# Patient Record
Sex: Female | Born: 1935
Health system: Southern US, Community
[De-identification: ages and names within clinical notes are randomized; demographics above are authoritative.]

## PROBLEM LIST (undated history)

## (undated) DIAGNOSIS — F329 Major depressive disorder, single episode, unspecified: Secondary | ICD-10-CM

## (undated) DIAGNOSIS — R1314 Dysphagia, pharyngoesophageal phase: Secondary | ICD-10-CM

## (undated) DIAGNOSIS — L509 Urticaria, unspecified: Secondary | ICD-10-CM

## (undated) DIAGNOSIS — M25552 Pain in left hip: Secondary | ICD-10-CM

## (undated) DIAGNOSIS — T783XXA Angioneurotic edema, initial encounter: Secondary | ICD-10-CM

## (undated) DIAGNOSIS — M5430 Sciatica, unspecified side: Secondary | ICD-10-CM

## (undated) DIAGNOSIS — F32A Depression, unspecified: Secondary | ICD-10-CM

## (undated) DIAGNOSIS — M7989 Other specified soft tissue disorders: Secondary | ICD-10-CM

## (undated) DIAGNOSIS — IMO0001 Reserved for inherently not codable concepts without codable children: Secondary | ICD-10-CM

## (undated) HISTORY — DX: Reserved for inherently not codable concepts without codable children: IMO0001

## (undated) HISTORY — DX: Urticaria, unspecified: L50.9

## (undated) HISTORY — DX: Pain in left hip: M25.552

## (undated) HISTORY — DX: Sciatica, unspecified side: M54.30

## (undated) HISTORY — DX: Dysphagia, pharyngoesophageal phase: R13.14

## (undated) HISTORY — PX: ABDOMINAL HYSTERECTOMY: SHX81

## (undated) HISTORY — DX: Angioneurotic edema, initial encounter: T78.3XXA

## (undated) HISTORY — DX: Depression, unspecified: F32.A

## (undated) HISTORY — DX: Other specified soft tissue disorders: M79.89

## (undated) HISTORY — DX: Major depressive disorder, single episode, unspecified: F32.9

---

## 1997-05-04 ENCOUNTER — Encounter (INDEPENDENT_AMBULATORY_CARE_PROVIDER_SITE_OTHER): Payer: Self-pay | Admitting: *Deleted

## 1997-05-04 LAB — CONVERTED CEMR LAB

## 1998-08-26 ENCOUNTER — Emergency Department (HOSPITAL_COMMUNITY): Admission: EM | Admit: 1998-08-26 | Discharge: 1998-08-26 | Payer: Self-pay | Admitting: Emergency Medicine

## 2001-02-18 ENCOUNTER — Encounter: Admission: RE | Admit: 2001-02-18 | Discharge: 2001-02-18 | Payer: Self-pay | Admitting: Family Medicine

## 2001-02-21 ENCOUNTER — Encounter: Admission: RE | Admit: 2001-02-21 | Discharge: 2001-02-21 | Payer: Self-pay | Admitting: Family Medicine

## 2002-02-28 ENCOUNTER — Encounter: Admission: RE | Admit: 2002-02-28 | Discharge: 2002-02-28 | Payer: Self-pay | Admitting: Family Medicine

## 2002-04-11 ENCOUNTER — Observation Stay (HOSPITAL_COMMUNITY): Admission: RE | Admit: 2002-04-11 | Discharge: 2002-04-12 | Payer: Self-pay | Admitting: *Deleted

## 2002-04-11 ENCOUNTER — Encounter (INDEPENDENT_AMBULATORY_CARE_PROVIDER_SITE_OTHER): Payer: Self-pay

## 2002-06-13 ENCOUNTER — Ambulatory Visit (HOSPITAL_COMMUNITY): Admission: RE | Admit: 2002-06-13 | Discharge: 2002-06-13 | Payer: Self-pay | Admitting: Ophthalmology

## 2002-07-07 ENCOUNTER — Ambulatory Visit (HOSPITAL_COMMUNITY): Admission: RE | Admit: 2002-07-07 | Discharge: 2002-07-07 | Payer: Self-pay | Admitting: Ophthalmology

## 2002-09-26 ENCOUNTER — Encounter: Admission: RE | Admit: 2002-09-26 | Discharge: 2002-09-26 | Payer: Self-pay | Admitting: Family Medicine

## 2002-11-01 ENCOUNTER — Encounter: Admission: RE | Admit: 2002-11-01 | Discharge: 2002-11-01 | Payer: Self-pay | Admitting: Family Medicine

## 2002-11-08 ENCOUNTER — Encounter: Payer: Self-pay | Admitting: Sports Medicine

## 2002-11-08 ENCOUNTER — Encounter: Admission: RE | Admit: 2002-11-08 | Discharge: 2002-11-08 | Payer: Self-pay | Admitting: Sports Medicine

## 2002-11-29 ENCOUNTER — Encounter: Admission: RE | Admit: 2002-11-29 | Discharge: 2002-11-29 | Payer: Self-pay | Admitting: Family Medicine

## 2003-02-20 ENCOUNTER — Encounter: Admission: RE | Admit: 2003-02-20 | Discharge: 2003-02-20 | Payer: Self-pay | Admitting: Family Medicine

## 2003-06-18 ENCOUNTER — Encounter: Admission: RE | Admit: 2003-06-18 | Discharge: 2003-06-18 | Payer: Self-pay | Admitting: Allergy and Immunology

## 2003-07-19 ENCOUNTER — Encounter: Admission: RE | Admit: 2003-07-19 | Discharge: 2003-07-19 | Payer: Self-pay | Admitting: Sports Medicine

## 2003-08-27 ENCOUNTER — Encounter: Admission: RE | Admit: 2003-08-27 | Discharge: 2003-08-27 | Payer: Self-pay | Admitting: Family Medicine

## 2003-08-31 ENCOUNTER — Encounter: Admission: RE | Admit: 2003-08-31 | Discharge: 2003-08-31 | Payer: Self-pay | Admitting: Sports Medicine

## 2003-10-03 ENCOUNTER — Encounter: Admission: RE | Admit: 2003-10-03 | Discharge: 2003-10-03 | Payer: Self-pay | Admitting: Family Medicine

## 2004-01-25 ENCOUNTER — Ambulatory Visit: Payer: Self-pay | Admitting: Family Medicine

## 2004-02-04 ENCOUNTER — Ambulatory Visit: Payer: Self-pay | Admitting: Family Medicine

## 2004-02-15 ENCOUNTER — Encounter: Admission: RE | Admit: 2004-02-15 | Discharge: 2004-02-15 | Payer: Self-pay | Admitting: Sports Medicine

## 2004-02-28 ENCOUNTER — Ambulatory Visit: Payer: Self-pay | Admitting: Family Medicine

## 2004-03-03 ENCOUNTER — Ambulatory Visit: Payer: Self-pay | Admitting: Family Medicine

## 2004-03-18 ENCOUNTER — Ambulatory Visit: Payer: Self-pay | Admitting: Family Medicine

## 2004-04-07 ENCOUNTER — Ambulatory Visit: Payer: Self-pay | Admitting: Sports Medicine

## 2004-05-21 ENCOUNTER — Ambulatory Visit: Payer: Self-pay | Admitting: Sports Medicine

## 2004-05-21 ENCOUNTER — Inpatient Hospital Stay (HOSPITAL_COMMUNITY): Admission: EM | Admit: 2004-05-21 | Discharge: 2004-05-23 | Payer: Self-pay | Admitting: Emergency Medicine

## 2004-05-21 ENCOUNTER — Ambulatory Visit: Payer: Self-pay | Admitting: Cardiology

## 2004-05-23 ENCOUNTER — Encounter: Payer: Self-pay | Admitting: Cardiology

## 2004-06-17 ENCOUNTER — Ambulatory Visit: Payer: Self-pay | Admitting: Sports Medicine

## 2004-09-06 ENCOUNTER — Emergency Department (HOSPITAL_COMMUNITY): Admission: EM | Admit: 2004-09-06 | Discharge: 2004-09-06 | Payer: Self-pay | Admitting: Emergency Medicine

## 2004-09-18 ENCOUNTER — Ambulatory Visit: Payer: Self-pay | Admitting: Family Medicine

## 2005-01-28 ENCOUNTER — Ambulatory Visit: Payer: Self-pay | Admitting: Family Medicine

## 2005-03-12 ENCOUNTER — Ambulatory Visit: Payer: Self-pay | Admitting: Family Medicine

## 2005-05-12 ENCOUNTER — Encounter: Admission: RE | Admit: 2005-05-12 | Discharge: 2005-05-12 | Payer: Self-pay | Admitting: Sports Medicine

## 2005-05-27 ENCOUNTER — Ambulatory Visit: Payer: Self-pay | Admitting: Family Medicine

## 2005-05-29 ENCOUNTER — Encounter: Admission: RE | Admit: 2005-05-29 | Discharge: 2005-05-29 | Payer: Self-pay | Admitting: Sports Medicine

## 2005-06-04 ENCOUNTER — Ambulatory Visit: Payer: Self-pay | Admitting: Family Medicine

## 2005-12-10 ENCOUNTER — Ambulatory Visit: Payer: Self-pay | Admitting: Family Medicine

## 2006-02-04 ENCOUNTER — Emergency Department (HOSPITAL_COMMUNITY): Admission: EM | Admit: 2006-02-04 | Discharge: 2006-02-04 | Payer: Self-pay | Admitting: Emergency Medicine

## 2006-02-05 ENCOUNTER — Ambulatory Visit (HOSPITAL_BASED_OUTPATIENT_CLINIC_OR_DEPARTMENT_OTHER): Admission: RE | Admit: 2006-02-05 | Discharge: 2006-02-05 | Payer: Self-pay | Admitting: Orthopedic Surgery

## 2006-02-06 ENCOUNTER — Emergency Department (HOSPITAL_COMMUNITY): Admission: EM | Admit: 2006-02-06 | Discharge: 2006-02-06 | Payer: Self-pay | Admitting: Emergency Medicine

## 2006-03-02 ENCOUNTER — Ambulatory Visit: Payer: Self-pay | Admitting: Sports Medicine

## 2006-05-18 ENCOUNTER — Ambulatory Visit: Payer: Self-pay | Admitting: Family Medicine

## 2006-06-02 ENCOUNTER — Encounter: Admission: RE | Admit: 2006-06-02 | Discharge: 2006-06-02 | Payer: Self-pay | Admitting: *Deleted

## 2006-07-01 DIAGNOSIS — T783XXA Angioneurotic edema, initial encounter: Secondary | ICD-10-CM | POA: Insufficient documentation

## 2006-07-01 DIAGNOSIS — L509 Urticaria, unspecified: Secondary | ICD-10-CM

## 2006-07-01 DIAGNOSIS — M8580 Other specified disorders of bone density and structure, unspecified site: Secondary | ICD-10-CM

## 2006-07-02 ENCOUNTER — Encounter (INDEPENDENT_AMBULATORY_CARE_PROVIDER_SITE_OTHER): Payer: Self-pay | Admitting: *Deleted

## 2006-12-07 ENCOUNTER — Telehealth: Payer: Self-pay | Admitting: *Deleted

## 2006-12-09 ENCOUNTER — Ambulatory Visit: Payer: Self-pay | Admitting: Family Medicine

## 2006-12-09 ENCOUNTER — Encounter: Payer: Self-pay | Admitting: Family Medicine

## 2006-12-27 ENCOUNTER — Encounter (INDEPENDENT_AMBULATORY_CARE_PROVIDER_SITE_OTHER): Payer: Self-pay | Admitting: *Deleted

## 2006-12-30 ENCOUNTER — Encounter: Admission: RE | Admit: 2006-12-30 | Discharge: 2006-12-30 | Payer: Self-pay | Admitting: Sports Medicine

## 2007-02-16 ENCOUNTER — Ambulatory Visit: Payer: Self-pay | Admitting: Family Medicine

## 2007-05-05 HISTORY — PX: MASTECTOMY, PARTIAL: SHX709

## 2007-12-29 ENCOUNTER — Ambulatory Visit: Payer: Self-pay | Admitting: Family Medicine

## 2007-12-29 LAB — CONVERTED CEMR LAB: Rapid Strep: NEGATIVE

## 2008-01-03 ENCOUNTER — Encounter: Payer: Self-pay | Admitting: Family Medicine

## 2008-01-11 ENCOUNTER — Ambulatory Visit: Payer: Self-pay | Admitting: Family Medicine

## 2008-01-12 ENCOUNTER — Encounter: Payer: Self-pay | Admitting: Sports Medicine

## 2008-01-12 LAB — CONVERTED CEMR LAB
Cholesterol: 185 mg/dL (ref 0–200)
HDL: 75 mg/dL (ref >39)
LDL Cholesterol: 90 mg/dL (ref 0–99)
Total CHOL/HDL Ratio: 2.5
Triglycerides: 101 mg/dL (ref <150)
VLDL: 20 mg/dL (ref 0–40)

## 2008-01-23 ENCOUNTER — Ambulatory Visit: Payer: Self-pay | Admitting: Family Medicine

## 2008-01-23 ENCOUNTER — Encounter: Payer: Self-pay | Admitting: Sports Medicine

## 2008-01-31 ENCOUNTER — Ambulatory Visit: Payer: Self-pay | Admitting: Family Medicine

## 2008-02-13 ENCOUNTER — Encounter: Admission: RE | Admit: 2008-02-13 | Discharge: 2008-02-13 | Payer: Self-pay | Admitting: Sports Medicine

## 2008-02-21 ENCOUNTER — Encounter: Admission: RE | Admit: 2008-02-21 | Discharge: 2008-02-21 | Payer: Self-pay | Admitting: Sports Medicine

## 2008-02-27 ENCOUNTER — Encounter: Payer: Self-pay | Admitting: Sports Medicine

## 2008-02-27 ENCOUNTER — Encounter (INDEPENDENT_AMBULATORY_CARE_PROVIDER_SITE_OTHER): Payer: Self-pay | Admitting: Diagnostic Radiology

## 2008-02-27 ENCOUNTER — Encounter: Admission: RE | Admit: 2008-02-27 | Discharge: 2008-02-27 | Payer: Self-pay | Admitting: Sports Medicine

## 2008-03-01 ENCOUNTER — Encounter: Payer: Self-pay | Admitting: Sports Medicine

## 2008-03-05 ENCOUNTER — Encounter: Admission: RE | Admit: 2008-03-05 | Discharge: 2008-03-05 | Payer: Self-pay | Admitting: Psychiatry

## 2008-03-07 ENCOUNTER — Ambulatory Visit: Payer: Self-pay | Admitting: Family Medicine

## 2008-03-11 ENCOUNTER — Encounter: Admission: RE | Admit: 2008-03-11 | Discharge: 2008-03-11 | Payer: Self-pay | Admitting: General Surgery

## 2008-03-19 ENCOUNTER — Encounter: Admission: RE | Admit: 2008-03-19 | Discharge: 2008-03-19 | Payer: Self-pay | Admitting: General Surgery

## 2008-03-19 ENCOUNTER — Encounter (INDEPENDENT_AMBULATORY_CARE_PROVIDER_SITE_OTHER): Payer: Self-pay | Admitting: General Surgery

## 2008-03-19 ENCOUNTER — Ambulatory Visit (HOSPITAL_COMMUNITY): Admission: RE | Admit: 2008-03-19 | Discharge: 2008-03-19 | Payer: Self-pay | Admitting: General Surgery

## 2008-03-22 ENCOUNTER — Ambulatory Visit: Payer: Self-pay | Admitting: Oncology

## 2008-04-04 ENCOUNTER — Encounter: Payer: Self-pay | Admitting: Sports Medicine

## 2008-04-04 LAB — CBC WITH DIFFERENTIAL/PLATELET
Eosinophils Absolute: 0.2 10*3/uL (ref 0.0–0.5)
HCT: 43.6 % (ref 34.8–46.6)
LYMPH%: 35.6 % (ref 14.0–48.0)
MONO#: 0.7 10*3/uL (ref 0.1–0.9)
NEUT#: 4.7 10*3/uL (ref 1.5–6.5)
NEUT%: 53.6 % (ref 39.6–76.8)
Platelets: 290 10*3/uL (ref 145–400)
WBC: 8.7 10*3/uL (ref 3.9–10.0)

## 2008-04-05 LAB — COMPREHENSIVE METABOLIC PANEL
BUN: 16 mg/dL (ref 6–23)
CO2: 22 mEq/L (ref 19–32)
Calcium: 9.5 mg/dL (ref 8.4–10.5)
Chloride: 104 mEq/L (ref 96–112)
Creatinine, Ser: 0.75 mg/dL (ref 0.40–1.20)
Glucose, Bld: 88 mg/dL (ref 70–99)
Total Bilirubin: 0.4 mg/dL (ref 0.3–1.2)

## 2008-04-05 LAB — CANCER ANTIGEN 27.29: CA 27.29: 11 U/mL (ref 0–39)

## 2008-04-05 LAB — VITAMIN D 25 HYDROXY (VIT D DEFICIENCY, FRACTURES): Vit D, 25-Hydroxy: 23 ng/mL — ABNORMAL LOW (ref 30–89)

## 2008-04-05 LAB — LACTATE DEHYDROGENASE: LDH: 132 U/L (ref 94–250)

## 2008-05-03 ENCOUNTER — Ambulatory Visit: Payer: Self-pay | Admitting: Oncology

## 2008-05-22 ENCOUNTER — Encounter: Admission: RE | Admit: 2008-05-22 | Discharge: 2008-05-22 | Payer: Self-pay | Admitting: General Surgery

## 2008-06-06 ENCOUNTER — Encounter: Admission: RE | Admit: 2008-06-06 | Discharge: 2008-06-06 | Payer: Self-pay | Admitting: Oncology

## 2008-08-08 ENCOUNTER — Ambulatory Visit: Payer: Self-pay | Admitting: Oncology

## 2008-09-04 ENCOUNTER — Encounter: Payer: Self-pay | Admitting: Sports Medicine

## 2008-12-21 ENCOUNTER — Ambulatory Visit: Payer: Self-pay | Admitting: Oncology

## 2008-12-25 ENCOUNTER — Encounter: Payer: Self-pay | Admitting: Sports Medicine

## 2009-02-20 ENCOUNTER — Encounter: Admission: RE | Admit: 2009-02-20 | Discharge: 2009-02-20 | Payer: Self-pay | Admitting: General Surgery

## 2009-03-25 ENCOUNTER — Ambulatory Visit: Payer: Self-pay | Admitting: Family Medicine

## 2009-03-25 DIAGNOSIS — Z853 Personal history of malignant neoplasm of breast: Secondary | ICD-10-CM | POA: Insufficient documentation

## 2009-06-14 ENCOUNTER — Ambulatory Visit: Payer: Self-pay | Admitting: Vascular Surgery

## 2009-06-14 ENCOUNTER — Encounter: Payer: Self-pay | Admitting: Family Medicine

## 2009-06-14 ENCOUNTER — Ambulatory Visit: Payer: Self-pay | Admitting: Family Medicine

## 2009-06-14 ENCOUNTER — Observation Stay (HOSPITAL_COMMUNITY): Admission: EM | Admit: 2009-06-14 | Discharge: 2009-06-15 | Payer: Self-pay | Admitting: Emergency Medicine

## 2009-06-24 ENCOUNTER — Telehealth: Payer: Self-pay | Admitting: Sports Medicine

## 2009-07-17 ENCOUNTER — Ambulatory Visit: Payer: Self-pay | Admitting: Family Medicine

## 2009-07-17 DIAGNOSIS — M21339 Wrist drop, unspecified wrist: Secondary | ICD-10-CM | POA: Insufficient documentation

## 2010-02-21 ENCOUNTER — Encounter: Admission: RE | Admit: 2010-02-21 | Discharge: 2010-02-21 | Payer: Self-pay | Admitting: General Surgery

## 2010-03-06 ENCOUNTER — Encounter: Payer: Self-pay | Admitting: Sports Medicine

## 2010-03-06 ENCOUNTER — Ambulatory Visit: Payer: Self-pay | Admitting: Family Medicine

## 2010-03-10 ENCOUNTER — Encounter: Payer: Self-pay | Admitting: Sports Medicine

## 2010-05-01 ENCOUNTER — Ambulatory Visit
Admission: RE | Admit: 2010-05-01 | Discharge: 2010-05-01 | Payer: Self-pay | Source: Home / Self Care | Attending: Family Medicine | Admitting: Family Medicine

## 2010-05-07 ENCOUNTER — Encounter: Payer: Self-pay | Admitting: Family Medicine

## 2010-05-07 ENCOUNTER — Ambulatory Visit: Admission: RE | Admit: 2010-05-07 | Discharge: 2010-05-07 | Payer: Self-pay | Source: Home / Self Care

## 2010-05-07 ENCOUNTER — Encounter: Payer: Self-pay | Admitting: *Deleted

## 2010-05-07 DIAGNOSIS — R1310 Dysphagia, unspecified: Secondary | ICD-10-CM | POA: Insufficient documentation

## 2010-05-15 ENCOUNTER — Encounter
Admission: RE | Admit: 2010-05-15 | Discharge: 2010-05-15 | Payer: Self-pay | Source: Home / Self Care | Attending: Gastroenterology | Admitting: Gastroenterology

## 2010-05-25 ENCOUNTER — Encounter: Payer: Self-pay | Admitting: General Surgery

## 2010-06-03 NOTE — Consult Note (Signed)
 Summary: [facility]   Imported By: Karna Seminole 09/11/2008 16:24:33  _____________________________________________________________________  External Attachment:    Type:   Image     Comment:   External Document

## 2010-06-03 NOTE — Initial Assessments (Signed)
Summary: [facility]P Hospital Admission    PCP:  Rodney Langton MD  Chief Complaint:  passed out/dislocated R shoulder.  History of Present Illness: 77yo F brought to the [facility] by her daughter after an unwitnessed fall that she cannot recall that resulted in a anteriorly dislocated R shoulder.  Prior to her fall, the patient's only symptom was numbness of the right hand.  She denies any CP, SOB, palpitations, dizziness, or LOC.  After the fall, she was alert enough to dial her daughter's phone number.  Upon arrival to the ER, she was in severe pain and was found to be in new A-Fib w/ RVR.  After giving pain and sedative meds to reduce the dislocated shoulder, the pt converted spontaneously to NSR.  Per ER physician, he did not appreciate any neurological deficits at time of arrival.  Daughter states that she thinks the fall was due to her mother being intoxicated with EtOH.  She reports that her mother has a history of EtOH depedence.  Chloe Lopez, who has capacity, and does not appear to be intoxicated initially would like to go home b/c of financial issues has been convinced to stay for observation and evaluation.   Review of Systems      See HPI   Physical Exam  General:  Well appearing elderly female, NAD. Head:  atraumatic Eyes:  EOMI, PERRLA Mouth:  dry mucosal membranes Neck:  supple full ROM Lungs:  Normal respiratory effort, chest expands symmetrically. Lungs are clear to auscultation, no crackles or wheezes. Heart:  Normal rate and regular rhythm. S1 and S2 normal without gallop, murmur, click, rub or other extra sounds. No carotid bruits Abdomen:  Soft, NT, ND, no HSM, active BS  Msk:  R shoulder in an arm sling;  Neurologic:  CN 2-12 grossly intact; no focal weakness or deficits; sensation grossly intact; Psych:  Patient has complete capacity; pt is possess insight Additional Exam:  R shoulder xray- anterior dislocation POC- neg, CE neg x1 Initial EKG- A-Fib w/ RVR, depressed  ST waves in V4-V6 Repeat EKG- NSR, ST waves inmproved Head CT- Chronic infarct; no acute processes EtOH- 100   Impression & Recommendations: 1. Fall: Unclear of etiology.  Could possibly be due to the EtOH or vasovagal, cardiac, or possibly neurogenic etiology.  POC neg.  CE neg x1. A-fib has resolved spontaneously per repeat EKG.  Head CT is neg for acute process.  Plan: Place on Tele to observe for any arrhythmias.  Cycle CEs.  Check carotid dopplers.  Start ASA 325mg .  2. A-fib: New.  Unclear of caused the A-fib to appear.  Currently in NSR.  If it returns, will place on CCB or B-blocker for rate control.  Will place on ASA 325mg  for now.  [place] score of 1.  3. s/p R shoulder dislocation: Currently repositioned in place and wearing a sling.  Pain control - percocet 5/325 1-2 q4h as needed pain.  Schedule motrin 800mg  q8h during hospitalization.  4. FEN: Reg diet  5. Proph: Heparin 5000u three times a day  6. Dispo: Admit to FPTS, obs status, tele bed.  Can be d/c'd once CEs are back and carotid dopplers obtained.     Complete Medication List: 1)  Eq Naproxen Sodium 220 Mg Tabs (Naproxen sodium) .... One tab by mouth two times a day as needed for pain 2)  Prednisone 5 Mg Tabs (Prednisone) 3)  Ranitidine Hcl 300 Mg Tabs (Ranitidine hcl) .... One tab by mouth two times a day 4)  Allegra 180 Mg Tabs (Fexofenadine hcl) .... One tab by mouth daily 5)  Doxepin Hcl 25 Mg Caps (Doxepin hcl) .... One tab by mouth daily 6)  Oscal 500/200 D-3 500-200 Mg-unit Tabs (Calcium-vitamin d) .... One tab by mouth daily  Other Orders: Future Orders: 12 Lead EKG (12 Lead EKG) ... 06/12/2010 Miscellaneous Lab Charge-[facility] [phone]) ... 06/11/2010 24 Hr Holter (24 Hr Holter) ... 06/05/2010   Family History: Reviewed history from 07/01/2006 and no changes required. No family history of cancer, diabetes, or hypertension.  Social History: Reviewed history from 07/01/2006 and no changes required. Patient  is a retired PhD Psychologist, counselling, having done Producer, television/film/video at [employer], and [employer].; Recently moved back to area from [place], [place], and [place], [place].; Lives alone in a guest house on a large estate occupied by close friends; daughter lives >1 mile away.  Has 3 daughters.  Divorced x 20 years.  Ex-husband was head of Infectious Disease at [employer]   Past History:  Past Medical History: ANA 9/03, Intermittent angioedema - responds to prednisone Invasive Ductal Carcinoma:  s/p lumpectomy and neg margins, negative axillary nodes, declined tamoxifen adjuvant.  Past Surgical History: Cataract surgery - 07/03/2002 hemorrhoidectomy - 06/04/2002,  Hysterectomy - 05/04/1972 02/27/2008 Needle biopsy of suspicious calcifications in left breast. 02/27/2008 Biopsy consistent with invasive ductal carcinoma    Prior Medications (reviewed today): EQ NAPROXEN SODIUM 220 MG TABS (NAPROXEN SODIUM) One tab by mouth two times a day as needed for pain PREDNISONE 5 MG TABS (PREDNISONE)  RANITIDINE HCL 300 MG TABS (RANITIDINE HCL) One tab by mouth two times a day ALLEGRA 180 MG TABS (FEXOFENADINE HCL) One tab by mouth daily DOXEPIN HCL 25 MG CAPS (DOXEPIN HCL) One tab by mouth daily OSCAL 500/200 D-3 500-200 MG-UNIT TABS (CALCIUM-VITAMIN D) One tab by mouth daily Current Allergies (reviewed today): No known allergies

## 2010-06-03 NOTE — Consult Note (Signed)
 Summary: [facility]   Imported By: BENTON MANSON 03/21/2008 09:44:56  _____________________________________________________________________  External Attachment:    Type:   Image     Comment:   External Document

## 2010-06-03 NOTE — Assessment & Plan Note (Signed)
 Summary: pap/kh   Vital Signs:  Patient Profile:   77 Years Old Female Height:     65.75 inches Weight:      162.5 pounds BMI:     26.52 Pulse rate:   58 / minute BP sitting:   145 / 77  (right arm)  Pt. in pain?   no  Vitals Entered By: JACK BLOODGOOD CMA, (January 31, 2008 1:34 PM)              Is Patient Diabetic? No    Herpes Zoster Next Due:  Refused Last PAP:  Done. (05/04/1997 12:00:00 AM) PAP Result Date:  01/31/2008 PAP Next Due:  Not Indicated   PCP:  Johniya Durfee  Chief Complaint:  regular visit. f/up per Dr.T..  History of Present Illness: Pt originally scheduled for PAP (last PAP documented in 2000) but informs us  today she has had a hysterectomy in the 1970s, non-cervix sparing, and still has both ovaries.  She is up to date with her other preventive health care and has a mammogram already scheduled.     Past Medical History:    Reviewed history from 07/01/2006 and no changes required:       ANA 9/03, Intermittent angioedema - responds to prednisone , Last angioedema flare in 12/04, Nl CBC, CMET, ESR, TSH 9/03  Past Surgical History:    Reviewed history from 07/01/2006 and no changes required:       Cataract surgery - 07/03/2002, ECHO 50-55% - 05/28/2004, hemorrhoidectomy - 06/04/2002, Hysterectomy - 05/04/1972, L knee injection - 09/18/2004   Family History:    Reviewed history from 07/01/2006 and no changes required:       No family history of cancer, diabetes, or hypertension.  Social History:    Reviewed history from 07/01/2006 and no changes required:       Patient is a retired PhD psychologist, counselling, having done producer, television/film/video at [employer], and [employer].; Recently moved back to area from [place], [place], and [place], [place].; Lives alone in a guest house on a large estate occupied by close friends; daughter lives >1 mile away.  Has 3 daughters.  Divorced x 20 years.  Ex-husband was head of Infectious Disease at [employer]   Risk Factors:  Mammogram History:       Date of Last Mammogram:  06/04/2006   PAP Smear History:     Date of Last PAP Smear:  05/04/1997     Physical Exam  General:     Well-developed,well-nourished,in no acute distress; alert,appropriate and cooperative throughout examination Nose:     External nasal examination shows no deformity or inflammation.    Impression & Recommendations:  Problem # 1:  Preventive Health Care (ICD-V70.0) Up to date, has had colonoscopy, mammogram is scheduled, FLP normal, PAP not needed, Hemoccults negative.  Can see patient back in one year.  I told her she can come back at any time to get her seasonal flu shot and that she did not have to see me for that.   Patient Instructions: 1)  Good to see you today. 2)  Come back anytime for your seasonal flu shot. 3)  Also have my nurses schedule you to see me in a years time.   4)  -Dr. Curtis   ]  Appended Document: pap/kh        Past Surgical History:    Cataract surgery - 07/03/2002, ECHO 50-55% - 05/28/2004, hemorrhoidectomy - 06/04/2002, Hysterectomy - 05/04/1972, L knee injection - 09/18/2004  02/27/2008 Needle biopsy of suspicious calcifications in left breast.          ]  Appended Document: pap/kh        Past Surgical History:    Cataract surgery - 07/03/2002, ECHO 50-55% - 05/28/2004, hemorrhoidectomy - 06/04/2002, Hysterectomy - 05/04/1972, L knee injection - 09/18/2004        02/27/2008 Needle biopsy of suspicious calcifications in left breast.    02/27/2008 Biopsy consistent with invasive ductal carcinoma          ]

## 2010-06-03 NOTE — Miscellaneous (Signed)
 Summary: [facility]  OV NOTE   Clinical Lists Changes HARD COPY PLACED IN MD BOX FOR REVIEW    ............................DELORES PATE-GADDY,CMA LEODIS) January 23, 2008 3:10 PM  Observations: Added new observation of HEMOCULTDUE: 01/14/2009 (01/23/2008 15:09) Added new observation of HDLNXTDUE: 01/11/2013 (01/23/2008 15:09) Added new observation of LDLNXTDUE: 01/11/2013 (01/23/2008 15:09) Added new observation of LAST STL OCC: 01/15/2008 (01/15/2008 16:58) Added new observation of HEMOCCULT: normal (01/15/2008 16:58)     Hemoccult Result Date:  01/15/2008 Hemoccult Result:  normal

## 2010-06-03 NOTE — Miscellaneous (Signed)
   Clinical Lists Changes  Problems: Removed problem of NEED PROPHYLACTIC VACCINATION&INOCULATION FLU (ICD-V04.81) Removed problem of BRACHIAL NEUROPATHY, ACUTE (ICD-723.4) Removed problem of OTHER FALL (ICD-E888.8) Removed problem of PREVENTIVE HEALTH CARE (ICD-V70.0) Removed problem of SCREENING FOR LIPOID DISORDERS (ICD-V77.91) Removed problem of ACUTE PHARYNGITIS (ICD-462) Removed problem of VACCINE AGAINST INFLUENZA (ICD-V04.81) Removed problem of KNEE PAIN, LEFT, HX OF (ICD-V13.5) Removed problem of HEMORRHOIDS, NOS (ICD-455.6) Removed problem of ARTHRITIS (ICD-716.90)

## 2010-06-03 NOTE — Progress Notes (Signed)
Summary: phn msg   Phone Note Other Incoming Call back at [phone]   Caller: [facility] Summary of Call: Pt is now refusing nursing services.  And [facility] will be discharging her. Initial call taken by: Clydell Hakim,  June 24, 2009 10:11 AM  Follow-up for Phone Call        Noted. Follow-up by: Rodney Langton MD,  June 24, 2009 11:49 AM       Social History: Patient is a retired PhD Psychologist, counselling, having done Producer, television/film/video at [employer], and [employer].; Recently moved back to area from [place], [place], and [place], [place].; Lives alone in a guest house on a large estate occupied by close friends; daughter lives >1 mile away.  Has 3 daughters.  Divorced x 20 years.  Ex-husband was head of Infectious Disease at [employer]  2/21>>Was getting [facility] home care but refused services and they discharged her.

## 2010-06-03 NOTE — Consult Note (Signed)
 [facility]   Imported By: Nathanel No 04/03/2009 11:58:02  _____________________________________________________________________  External Attachment:    Type:   Image     Comment:   External Document

## 2010-06-03 NOTE — Assessment & Plan Note (Signed)
 Summary: FU/KH   Vital Signs:  Patient Profile:   77 Years Old Female Height:     65.75 inches Weight:      161.8 pounds Temp:     98.1 degrees F Pulse rate:   60 / minute BP sitting:   144 / 78  (left arm)  Pt. in pain?   no  Vitals Entered By: AVELINA SHARPS RN (January 11, 2008 2:13 PM)              Is Patient Diabetic? No     PCP:  Avina Eberle   History of Present Illness: 77 year old female with angioedema, arthritis, and ostepenia presents for routine follow up, no complaints, sore throat completely resolved.  No fevers/chills, dysphagia, odynophagia, sob, n/v/d/c/, weakness.    Past Medical History:    Reviewed history from 07/01/2006 and no changes required:       ANA 9/03, Intermittent angioedema - responds to prednisone , Last angioedema flare in 12/04, Nl CBC, CMET, ESR, TSH 9/03  Past Surgical History:    Reviewed history from 07/01/2006 and no changes required:       Cataract surgery - 07/03/2002, ECHO 50-55% - 05/28/2004, hemorrhoidectomy - 06/04/2002, Hysterectomy - 05/04/1972, L knee injection - 09/18/2004   Family History:    Reviewed history from 07/01/2006 and no changes required:       No family history of cancer, diabetes, or hypertension.  Social History:    Reviewed history from 07/01/2006 and no changes required:       Patient is a retired PhD psychologist, counselling, having done producer, television/film/video at [employer], and [employer].; Recently moved back to area from [place], [place], and [place], [place].; Lives alone in a guest house on a large estate occupied by close friends; daughter lives >1 mile away.  Has 3 daughters.  Divorced x 20 years.  Ex-husband was head of Infectious Disease at [employer]   Risk Factors:  Tobacco use:  quit   Review of Systems       As above in HPI   Physical Exam  General:     Well-developed,well-nourished,in no acute distress; alert,appropriate and cooperative throughout examination Head:     normocephalic, atraumatic, and no  abnormalities observed.   Eyes:     vision grossly intact, pupils equal, and pupils round.   Ears:     R ear normal, L ear normal, and no external deformities.   Nose:     no external deformity.   Neck:     No deformities, masses, or tenderness noted. Lungs:     Normal respiratory effort, chest expands symmetrically. Lungs are clear to auscultation, no crackles or wheezes. Heart:     Normal rate and regular rhythm. S1 and S2 normal without gallop, murmur, click, rub or other extra sounds.    Impression & Recommendations:  Problem # 1:  ACUTE PHARYNGITIS (ICD-462) Assessment: New Resolved   Orders: [facility]- Est Level  3 (00786)   Problem # 2:  SCREENING FOR LIPOID DISORDERS (ICD-V77.91) Lpid panel drawn and normal, no medication changes at this time.    Orders: Lipid-[facility] ([id number])   Problem # 3:  Preventive Health Care (ICD-V70.0) Had colonoscopy 2 years ago, next in 8 years, screening mammogram scheduled, PAP smear scheduled, Tri-hemoccults given to patient.   Patient Instructions: 1)  Good to see you again today and good to see that you feel better. 2)  Have my nurse schedule you for a mammogram, PAP smear, Hemoccult cards, and I  will also check your Cholesterol today. 3)  I'd like you to come back and see me when your PAP is scheduled. 4)  -Dr. Curtis   ]  Vital Signs:  Patient Profile:   77 Years Old Female Height:     65.75 inches Weight:      161.8 pounds Temp:     98.1 degrees F Pulse rate:   60 / minute BP sitting:   144 / 78

## 2010-06-03 NOTE — Consult Note (Signed)
 Summary: [facility] New Patient Evaluation  [facility] New Patient Evaluation   Imported By: BENTON MANSON 04/24/2008 08:56:57  _____________________________________________________________________  External Attachment:    Type:   Image     Comment:   External Document

## 2010-06-03 NOTE — Assessment & Plan Note (Signed)
Summary: hfu,tcb   Vital Signs:  Patient profile:   77 year old female Weight:      159.2 pounds Temp:     98.5 degrees F oral Pulse rate:   59 / minute Pulse rhythm:   regular BP sitting:   126 / 79  (left arm) Cuff size:   regular  Vitals Entered By: Loralee Pacas CMA (July 17, 2009 8:55 AM)  Primary Care Provider:  Rodney Langton MD   History of Present Illness: HFU, fell, shoulder dislocation R, ant.  reduced in ED.  residual wrist drop.  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  Improving.  Getting PT.  has brace to prevent contracture.  Numbness also improving.  L knee pain:  Present decades.  Had visco-supplementation in the past.  Also cortisone shots.  All helped.  Is not on any controlled analgesic.  unclear whether the weather worsens pain.  Stiffens with rest.  No catching, popping, locking, trauma.  Current Medications (verified): 1)  Prednisone 5 Mg Tabs (Prednisone) 2)  Ranitidine Hcl 300 Mg Tabs (Ranitidine Hcl) .... One Tab By Mouth Two Times A Day 3)  Allegra 180 Mg Tabs (Fexofenadine Hcl) .... One Tab By Mouth Daily 4)  Doxepin Hcl 25 Mg Caps (Doxepin Hcl) .... One Tab By Mouth Daily 5)  Oscal 500/200 D-3 500-200 Mg-Unit Tabs (Calcium-Vitamin D) .... One Tab By Mouth Daily 6)  Vitamin B-6 50 Mg Tabs (Pyridoxine Hcl) .... One Tab By Mouth Daily 7)  Glucosamine-Chondroitin 250-200 Mg Tabs (Glucosamine-Chondroitin) .... One Tab By Mouth Bid 8)  8 Hour Pain Relief 650 Mg Cr-Tabs (Acetaminophen) .... One Tab By Mouth Three Times A Day, Do Not Drink Alcohol With This Medication.  Allergies (verified): No Known Drug Allergies  Review of Systems       See HPI  Physical Exam  General:  Well-developed,well-nourished,in no acute distress; alert,appropriate and cooperative throughout examination Msk:  Knees bilaterally: Normal to inspection with no erythema or effusion or obvious bony  abnormalities. Palpation normal with no warmth or joint line tenderness or patellar tenderness or condyle tenderness. ROM normal in flexion and extension and lower leg rotation. Ligaments with solid consistent endpoints including ACL, PCL, LCL, MCL. Negative Mcmurray's and provocative meniscal tests. Non painful patellar compression. Patellar and quadriceps tendons unremarkable. Hamstring and quadriceps strength is normal.    Wrist:  1/5 strength to extension, numbness mostly to radial distribution although there are some ulnar components as well with inability to abduct fingers, as well as median component with inabiilty to abduct thumb.   Impression & Recommendations:  Problem # 1:  WRIST DROP (ICD-736.05) Will add vitamin B6 as this has been shown in some trials to speed nerve regeneration.  Cont PT.  RTC 3 months to recheck.  Was seen by ortho in hospital who agreed that this was likely a transient neuropraxia.  Orders: [facility]- Est  Level 4 (21308)  Problem # 2:  BRACHIAL NEUROPATHY, ACUTE (ICD-723.4) See #1 Orders: [facility]- Est  Level 4 (65784)  Problem # 3:  ARTHRITIS (ICD-716.90) Present 15 years.  Last XR >10 years ago.  XR knees to assess severity.  Will add tylenol arthritis scheduled, also can try glucosamine/chondroitin tabs.  taught her how to do knee exercises.  RTC 3 months to reassess.  Consider joint injection in no improvement.  Orders: Diagnostic X-Ray/Fluoroscopy (Diagnostic X-Ray/Flu) [facility]- Est  Level 4 (69629)  Complete Medication List: 1)  Prednisone 5 Mg Tabs (  Prednisone) 2)  Ranitidine Hcl 300 Mg Tabs (Ranitidine hcl) .... One tab by mouth two times a day 3)  Allegra 180 Mg Tabs (Fexofenadine hcl) .... One tab by mouth daily 4)  Doxepin Hcl 25 Mg Caps (Doxepin hcl) .... One tab by mouth daily 5)  Oscal 500/200 D-3 500-200 Mg-unit Tabs (Calcium-vitamin d) .... One tab by mouth daily 6)  Vitamin B-6 50 Mg Tabs (Pyridoxine hcl) .... One tab by mouth daily 7)   Glucosamine-chondroitin 250-200 Mg Tabs (Glucosamine-chondroitin) .... One tab by mouth bid 8)  8 Hour Pain Relief 650 Mg Cr-tabs (Acetaminophen) .... One tab by mouth three times a day, do not drink alcohol with this medication.  Patient Instructions: 1)  XR knees 2)  glucosamine 3)  vitamin B6 4)  continue physical therapy 5)  do knee exercises 6)  Come back to see me in 3 months to see if knee pain is better and follow up your wrist drop. 7)  -Dr. Karie Schwalbe. Prescriptions: 8 HOUR PAIN RELIEF 650 MG CR-TABS (ACETAMINOPHEN) One tab by mouth three times a day, do not drink alcohol with this medication.  #90 x 6   Entered and Authorized by:   Rodney Langton MD   Signed by:   Rodney Langton MD on 07/17/2009   Method used:   Electronically to        [facility] [name] (retail)       [name]       [place], [place]  [address]       Ph: [phone] or [phone]       Fax: [phone]   RxID:   [id number] GLUCOSAMINE-CHONDROITIN 250-200 MG TABS (GLUCOSAMINE-CHONDROITIN) One tab by mouth BID  #90 x 6   Entered and Authorized by:   Rodney Langton MD   Signed by:   Rodney Langton MD on 07/17/2009   Method used:   Electronically to        [facility] [name] (retail)       [name]       [place], [place]  [address]       Ph: [phone] or [phone]       Fax: [phone]   RxID:   [id number] VITAMIN B-6 50 MG TABS (PYRIDOXINE HCL) One tab by mouth daily  #90 x 0   Entered and Authorized by:   Rodney Langton MD   Signed by:   Rodney Langton MD on 07/17/2009   Method used:   Electronically to        [facility] [name] (retail)       [name]       [place], [place]  [address]       Ph: [phone] or [phone]       Fax: [phone]   RxID:   [id number]

## 2010-06-03 NOTE — Consult Note (Signed)
Summary: [facility]   Imported By: De Nurse 03/25/2010 10:53:39  _____________________________________________________________________  External Attachment:    Type:   Image     Comment:   External Document

## 2010-06-03 NOTE — Assessment & Plan Note (Signed)
 Summary: SORE THROAT/EO   Vital Signs:  Patient Profile:   77 Years Old Female Weight:      163 pounds Pulse rate:   72 / minute BP sitting:   132 / 78  Vitals Entered By: GIOVANNA ADA CMA (December 29, 2007 2:38 PM)                 Chief Complaint:  SORE THROAT X 4D.  History of Present Illness: This is a pleasant 77 year old female with a 4 day history of sore throat.  There is a child in the house that had a cold and sniffles, after contact with him, she began to lose her voice, and throat became sore.  The voice then improved and was only mildly hoarse today.  The sore throat was better that yesterday as well.  No cough but has some drainage and phlegm that is clear.  Denies any HA, body aches, fevers/chills, N/V/D/C, SOB, CP.        Review of Systems       AS above in HPI   Physical Exam  General:     Well-developed,well-nourished,in no acute distress; alert,appropriate and cooperative throughout examination Head:     Normocephalic and atraumatic without obvious abnormalities. Eyes:     No corneal or conjunctival inflammation noted. EOMI. Perrl Ears:     External ear exam shows no significant lesions or deformities.  Otoscopic examination reveals clear canals, tympanic membranes are intact bilaterally without bulging, retraction, inflammation or discharge. Hearing is grossly normal bilaterally. Nose:     External nasal examination shows no deformity or inflammation. Nasal mucosa are pink and moist without lesions or exudates. Mouth:     Oral mucosa and oropharynx without lesions or exudates.  Tonsils without exudate, mild erythema over soft palate and posterior oropharynx. Neck:     No deformities, masses, or tenderness noted. Lungs:     Normal respiratory effort, chest expands symmetrically. Lungs are clear to auscultation, no crackles or wheezes. Heart:     Normal rate and regular rhythm. S1 and S2 normal without gallop, murmur, click, rub or other extra  sounds. Cervical Nodes:     Mild, tender lymph node palpated in right cervical chain, approx 1 cm.    Impression & Recommendations:  Problem # 1:  ACUTE PHARYNGITIS (ICD-462) Strep negative, positive sick contact.  No fever.  This is likely a viral pharyngitis/resolution of viral laryngitis (voice loss).  This is resolve on its own.  The patient did not wish to have any topical analgesic spray, and is unable to use oral NSAIDS 2/2 hx of angioedema and urticaria when she uses these.  She will need to avoid sick contacts for now, drink 6-8 glasses of water each day, drink lots of orange juice, and come back to see me in 2-3 weeks to make sure things are resolving.  Orders: Rapid Strep-[facility] (12569)   Problem # 2:  ANGIOEDEMA (ICD-995.1) No recent episodes, follows this at [facility] allergy clinic.  Other Orders: [facility]- Est  Level 4 (00785)   Patient Instructions: 1)  It was very nice to meet you today, you do not have strep throat, however it seems that your close relationship with the youngster has rewarded you with a viral upper respiratory infection.  These can be very annoying but tend to resolve on their own.  Remember to drink lots of orange juice and water (at least 6-8 glasses a day) and come back to see me in 2-3 weeks.  Feel free to call with any questions/concerns you may have. 2)  -Dr. Curtis   ]  Orders Added: 1)  Rapid Strep-[facility] [87430] 2)  [facility]- Est  Level 4 [99214]   Laboratory Results  Date/Time Received: December 29, 2007 3:05 PM  Date/Time Reported: December 29, 2007 3:19 PM   Other Tests  Rapid Strep: negative Comments: ...........test performed by...........SABRAArland Morel, CMA

## 2010-06-03 NOTE — Assessment & Plan Note (Signed)
Summary: FLU SHOT/KH   Nurse Visit   Allergies: No Known Drug Allergies  Immunizations Administered:  Influenza Vaccine # 1:    Vaccine Type: Fluvax MCR    Site: right deltoid    Mfr: GlaxoSmithKline    Dose: 0.5 ml    Route: IM    Given by: Theresia Lo RN    Exp. Date: 10/29/2010    Lot #: [id number]    VIS given: 11/26/09 version given March 06, 2010.  Flu Vaccine Consent Questions:    Do you have a history of severe allergic reactions to this vaccine? no    Any prior history of allergic reactions to egg and/or gelatin? no    Do you have a sensitivity to the preservative Thimersol? no    Do you have a past history of Guillan-Barre Syndrome? no    Do you currently have an acute febrile illness? no    Have you ever had a severe reaction to latex? no    Vaccine information given and explained to patient? yes    Are you currently pregnant? no  Orders Added: 1)  Influenza Vaccine MCR [00025] 2)  Administration Flu vaccine - MCR [G0008]  Appended Document: FLU SHOT/KH   Vital Signs:  Patient profile:   77 year old female Temp:     98.4 degrees F  Vitals Entered By: Theresia Lo RN (March 06, 2010 9:58 AM)

## 2010-06-03 NOTE — Assessment & Plan Note (Signed)
 Summary: CPE/KH   Vital Signs:  Patient profile:   77 year old female Height:      64.25 inches Weight:      158.38 pounds Temp:     98.8 degrees F oral Pulse rate:   71 / minute BP sitting:   128 / 73  (right arm)  Vitals Entered By: Arland Morel (March 25, 2009 1:46 PM) Is Patient Diabetic? No Pain Assessment Patient in pain? no        Primary Care Provider:  Debby Petties MD   History of Present Illness: Chloe Lopez with breast Ca returns for follow up and CPE  Ductal carcinoma, invasive: s/p lumpectomy with negative axillary nodes, multiple repeat mammograms, declined Tamoxifen therapy.  has been following up regularly with onc and surg.  Still with numbness in the medial cutaneous nerve of the arm distribution since surgery.  Hives:  Still present, only mildly palliated by allegra, prednisone , and doxepin , followed by Allergist.  I spent >25 mins in face to face time with this patient.  Habits & Providers  Alcohol-Tobacco-Diet     Tobacco Status: quit > 6 months  Current Medications (verified): 1)  Eq Naproxen Sodium 220 Mg Tabs (Naproxen Sodium) .... One Tab By Mouth Two Times A Day As Needed For Pain 2)  Prednisone  5 Mg Tabs (Prednisone ) 3)  Ranitidine  Hcl 300 Mg Tabs (Ranitidine  Hcl) .... One Tab By Mouth Two Times A Day 4)  Allegra 180 Mg Tabs (Fexofenadine Hcl) .... One Tab By Mouth Daily 5)  Doxepin  Hcl 25 Mg Caps (Doxepin  Hcl) .... One Tab By Mouth Daily 6)  Oscal 500/200 D-3 500-200 Mg-Unit Tabs (Calcium -Vitamin D ) .... One Tab By Mouth Daily  Allergies (verified): No Known Drug Allergies  Past History:  Past Surgical History: Last updated: 02/28/2008 Cataract surgery - 07/03/2002, ECHO 50-55% - 05/28/2004, hemorrhoidectomy - 06/04/2002, Hysterectomy - 05/04/1972, L knee injection - 09/18/2004  02/27/2008 Needle biopsy of suspicious calcifications in left breast. 02/27/2008 Biopsy consistent with invasive ductal carcinoma  Family History: Last updated:  07/01/2006 No family history of cancer, diabetes, or hypertension.  Social History: Last updated: 07/01/2006 Patient is a retired PhD psychologist, counselling, having done producer, television/film/video at [employer], and [employer].; Recently moved back to area from [place], [place], and [place], [place].; Lives alone in a guest house on a large estate occupied by close friends; daughter lives >1 mile away.  Has 3 daughters.  Divorced x 20 years.  Ex-husband was head of Infectious Disease at [employer]  Past Medical History: ANA 9/03, Intermittent angioedema - responds to prednisone , Last angioedema flare in 12/04, Nl CBC, CMET, ESR, TSH 9/03 Invasive Ductal Carcinoma:  s/p lumpectomy and neg margins, negative axillary nodes, declined tamoxifen adjuvant.  Social History: Smoking Status:  quit > 6 months  Review of Systems       See HPI  Physical Exam  General:  Well-developed,well-nourished,in no acute distress; alert,appropriate and cooperative throughout examination Head:  Normocephalic and atraumatic without obvious abnormalities. No apparent alopecia or balding. Eyes:  No corneal or conjunctival inflammation noted. EOMI. Perrla. Ears:  External ear exam shows no significant lesions or deformities.  Nose:  External nasal examination shows no deformity or inflammation. Mouth:  Oral mucosa and oropharynx without lesions or exudates.  Neck:  No deformities, masses, or tenderness noted. Lungs:  Normal respiratory effort, chest expands symmetrically. Lungs are clear to auscultation, no crackles or wheezes. Heart:  Normal rate and regular rhythm. S1 and S2 normal without gallop, murmur, click,  rub or other extra sounds. Abdomen:  Bowel sounds positive,abdomen soft and non-tender without masses, organomegaly or hernias noted. Extremities:  No clubbing, cyanosis, edema, or deformity noted with normal full range of motion of all joints.   Neurologic:  Grossly non-focal, still with numbness in medial cutaneous nerve of the arm  distribution on left. Skin:  Two 2cm pruritic urticaria noted on flank.   Impression & Recommendations:  Problem # 1:  INFILTRATING DUCTAL CARCINOMA, LEFT BREAST (ICD-174.9) Assessment New Stable, no mets, s/p lumpectomy, negative axillary nodes, followed by oncology and breast surgery.  Orders: [facility]- Est  Level 4 (00785)  Problem # 2:  PREVENTIVE HEALTH CARE (ICD-V70.0) Assessment: Unchanged Flu shot today.  Problem # 3:  URTICARIA-HIVES (ICD-708.9) Assessment: Unchanged Followed by allergist, taking prednisone , allegra, doxepin , still with occasional itching.  Complete Medication List: 1)  Eq Naproxen Sodium 220 Mg Tabs (Naproxen sodium) .... One tab by mouth two times a day as needed for pain 2)  Prednisone  5 Mg Tabs (Prednisone ) 3)  Ranitidine  Hcl 300 Mg Tabs (Ranitidine  hcl) .... One tab by mouth two times a day 4)  Allegra 180 Mg Tabs (Fexofenadine hcl) .... One tab by mouth daily 5)  Doxepin  Hcl 25 Mg Caps (Doxepin  hcl) .... One tab by mouth daily 6)  Oscal 500/200 D-3 500-200 Mg-unit Tabs (Calcium -vitamin d ) .... One tab by mouth daily  Other Orders: Influenza Vaccine MCR (99974)  Patient Instructions: 1)  Great to see you, 2)  flu shot today, 3)  Come back in a year or sooner if you have any problems. 4)  Call the office if you need any refills. 5)  -Dr. ONEIDA.    Last Flu Vaccine:  Fluvax 3+ (03/07/2008 9:35:46 AM) Flu Vaccine Result Date:  03/25/2009 Flu Vaccine Result:  given Flu Vaccine Next Due:  1 yr Last Flex Sig:  Done. (10/03/1998 12:00:00 AM) Flex Sig Next Due:  Not Indicated Colonoscopy Next Due:  Not Indicated Last Hemoccult Result: normal (01/15/2008 4:58:03 PM) Hemoccult Next Due:  Not Indicated Last PAP:  Done. (05/04/1997 12:00:00 AM) PAP Next Due:  Not Indicated Last Mammogram:  76095.0^MM BREAST STEREO BIOPSY*L* (02/27/2008 9:49:00 AM) Mammogram Result Date:  02/20/2009 Mammogram Result:  normal Mammogram Next Due:  1 yr     Prevention  & Chronic Care Immunizations   Influenza vaccine: given  (03/25/2009)   Influenza vaccine due: 03/25/2010    Tetanus booster: 11/02/2002: Done.   Tetanus booster due: 11/01/2012    Pneumococcal vaccine: Done.  (02/01/2002)   Pneumococcal vaccine due: None    H. zoster vaccine: Not documented  Colorectal Screening   Hemoccult: normal  (01/15/2008)   Hemoccult due: Not Indicated    Colonoscopy: Not documented   Colonoscopy due: Not Indicated  Other Screening   Pap smear: Done.  (05/04/1997)   Pap smear due: Not Indicated    Mammogram: normal  (02/20/2009)   Mammogram due: 02/20/2010    DXA bone density scan: Done.  (06/05/2003)   DXA scan due: None    Smoking status: quit > 6 months  (03/25/2009)  Lipids   Total Cholesterol: 185  (01/12/2008)   LDL: 90  (01/12/2008)   LDL Direct: Not documented   HDL: 75  (01/12/2008)   Triglycerides: 101  (01/12/2008)   Nursing Instructions: Give Flu vaccine today     Influenza Vaccine    Vaccine Type: Fluvax MCR    Site: left deltoid    Mfr: GlaxoSmithKline    Dose: 0.5 ml  Route: IM    Given by: Arland Morel    Exp. Date: 10/31/2009    Lot #: [id number]    VIS given: 12/11/2008  Flu Vaccine Consent Questions    Do you have a history of severe allergic reactions to this vaccine? no    Any prior history of allergic reactions to egg and/or gelatin? no    Do you have a sensitivity to the preservative Thimersol? no    Do you have a past history of Guillan-Barre Syndrome? no    Do you currently have an acute febrile illness? no    Have you ever had a severe reaction to latex? no    Vaccine information given and explained to patient? yes    Are you currently pregnant? no

## 2010-06-05 NOTE — Letter (Addendum)
Summary: *Referral Letter  [facility]  [address]   [place], [place] [address]   Phone: [phone]  Fax: [phone]    05/07/2010  Thank you in advance for agreeing to see my patient:  Chloe Lopez [address] [place], [place]  [address]  Phone: [phone]  Reason for Referral: Dysphagia for 2 weeks. Able to swallow and no difference between solids or liquids.  Procedures Requested: Consideration for EGD.  Current Medical Problems: 1)  DYSPHAGIA UNSPECIFIED (ICD-787.20) 2)  WRIST DROP (ICD-736.05) 3)  INFILTRATING DUCTAL CARCINOMA, LEFT BREAST (ICD-174.9) 4)  URTICARIA-HIVES (ICD-708.9) 5)  OSTEOPENIA (ICD-733.90) 6)  ANGIOEDEMA (ICD-995.1)   Current Medications: 1)  PREDNISONE 5 MG TABS (PREDNISONE)  2)  RANITIDINE HCL 300 MG TABS (RANITIDINE HCL) One tab by mouth two times a day 3)  ALLEGRA 180 MG TABS (FEXOFENADINE HCL) One tab by mouth daily 4)  DOXEPIN HCL 25 MG CAPS (DOXEPIN HCL) One tab by mouth daily 5)  OSCAL 500/200 D-3 500-200 MG-UNIT TABS (CALCIUM-VITAMIN D) One tab by mouth daily 6)  VITAMIN B-6 50 MG TABS (PYRIDOXINE HCL) One tab by mouth daily 7)  GLUCOSAMINE-CHONDROITIN 250-200 MG TABS (GLUCOSAMINE-CHONDROITIN) One tab by mouth BID 8)  8 HOUR PAIN RELIEF 650 MG CR-TABS (ACETAMINOPHEN) One tab by mouth three times a day, do not drink alcohol with this medication. 9)  FLUTICASONE PROPIONATE 50 MCG/ACT SUSP (FLUTICASONE PROPIONATE) 2 spray each nostril daily.   Past Medical History: 1)  ANA 9/03, Intermittent angioedema - responds to prednisone 2)  Invasive Ductal Carcinoma:  s/p lumpectomy and neg margins, negative axillary nodes, declined tamoxifen adjuvant.    Thank you again for agreeing to see our patient; please contact us if you have any further questions or need additional information.  Sincerely,  Clementeen Graham MD  Appended Document: *Referral Letter faxed

## 2010-06-05 NOTE — Assessment & Plan Note (Signed)
Summary: check for pneumonia/eo   Vital Signs:  Patient profile:   77 year old female Weight:      149 pounds Temp:     97.8 degrees F oral Pulse rate:   67 / minute BP sitting:   135 / 76  (right arm)  Vitals Entered By: Arlyss Repress CMA, (May 01, 2010 9:05 AM) CC: cold sx's x 1 week. cough x 1 day Is Patient Diabetic? No Pain Assessment Patient in pain? no      s  Primary Care Provider:  Rodney Langton MD  CC:  cold sx's x 1 week. cough x 1 day.  History of Present Illness: The patient presents to the office with 1 week of cold symptoms. The patient lost her voice 1 week ago, she compains of a stuffy nose and a scratchy throat that is resolving. The patient was concerned because she felt like her throat was swollen and she had trouble swallowing. She no longer experiences this symptom and is able to eat, drink, and breathe normally. The patient had one day of productive cough that has also subsided. The patient states she has not taken anything but is starting to feel better.  The patient denies fever/chills/nausea/vomitting/body aches/trouble breathing.  Habits & Providers  Alcohol-Tobacco-Diet     Tobacco Status: quit > 6 months  Current Problems (verified): 1)  Viral Uri  (ICD-465.9) 2)  Wrist Drop  (ICD-736.05) 3)  Infiltrating Ductal Carcinoma, Left Breast  (ICD-174.9) 4)  Urticaria-hives  (ICD-708.9) 5)  Osteopenia  (ICD-733.90) 6)  Angioedema  (ICD-995.1)  Current Medications (verified): 1)  Prednisone 5 Mg Tabs (Prednisone) 2)  Ranitidine Hcl 300 Mg Tabs (Ranitidine Hcl) .... One Tab By Mouth Two Times A Day 3)  Allegra 180 Mg Tabs (Fexofenadine Hcl) .... One Tab By Mouth Daily 4)  Doxepin Hcl 25 Mg Caps (Doxepin Hcl) .... One Tab By Mouth Daily 5)  Oscal 500/200 D-3 500-200 Mg-Unit Tabs (Calcium-Vitamin D) .... One Tab By Mouth Daily 6)  Vitamin B-6 50 Mg Tabs (Pyridoxine Hcl) .... One Tab By Mouth Daily 7)  Glucosamine-Chondroitin 250-200 Mg  Tabs (Glucosamine-Chondroitin) .... One Tab By Mouth Bid 8)  8 Hour Pain Relief 650 Mg Cr-Tabs (Acetaminophen) .... One Tab By Mouth Three Times A Day, Do Not Drink Alcohol With This Medication.  Allergies (verified): No Known Drug Allergies  Past History:  Past Medical History: Last updated: 06/14/2009 ANA 9/03, Intermittent angioedema - responds to prednisone Invasive Ductal Carcinoma:  s/p lumpectomy and neg margins, negative axillary nodes, declined tamoxifen adjuvant.  Past Surgical History: Last updated: 06/14/2009 Cataract surgery - 07/03/2002 hemorrhoidectomy - 06/04/2002,  Hysterectomy - 05/04/1972 02/27/2008 Needle biopsy of suspicious calcifications in left breast. 02/27/2008 Biopsy consistent with invasive ductal carcinoma  Family History: Last updated: 07/01/2006 No family history of cancer, diabetes, or hypertension.  Social History: Last updated: 06/24/2009 Patient is a retired PhD Psychologist, counselling, having done Producer, television/film/video at [employer], and [employer].; Recently moved back to area from [place], [place], and [place], [place].; Lives alone in a guest house on a large estate occupied by close friends; daughter lives >1 mile away.  Has 3 daughters.  Divorced x 20 years.  Ex-husband was head of Infectious Disease at [employer]  2/21>>Was getting [facility] home care but refused services and they discharged her.  Risk Factors: Smoking Status: quit > 6 months (05/01/2010)  Family History: Reviewed history from 07/01/2006 and no changes required. No family history of cancer, diabetes, or hypertension.  Social History:  Reviewed history from 06/24/2009 and no changes required. Patient is a retired PhD Psychologist, counselling, having done Producer, television/film/video at [employer], and [employer].; Recently moved back to area from [place], [place], and [place], [place].; Lives alone in a guest house on a large estate occupied by close friends; daughter lives >1 mile away.  Has 3 daughters.  Divorced x 20 years.   Ex-husband was head of Infectious Disease at [facility] [employer] [facility]  2/21>>Was getting [facility] home care but refused services and they discharged her.  Review of Systems  The patient denies fever, syncope, dyspnea on exertion, abdominal pain, and anorexia.    Physical Exam  General:  Well-developed,well-nourished,in no acute distress; alert,appropriate and cooperative throughout examination Head:  Normocephalic and atraumatic without obvious abnormalities. No apparent alopecia or balding. Eyes:  No corneal or conjunctival inflammation noted. EOMI. Perrla. Ears:  External ear exam shows no significant lesions or deformities.  Otoscopic examination reveals clear canals, tympanic membranes are intact bilaterally without bulging, retraction, inflammation or discharge. Hearing is grossly normal bilaterally. Mouth:  pharyngeal erythema and postnasal drip.   Lungs:  Normal respiratory effort, chest expands symmetrically. Lungs are clear to auscultation, no crackles or wheezes. Heart:  Normal rate and regular rhythm. S1 and S2 normal without gallop, murmur, click, rub or other extra sounds. Abdomen:  Bowel sounds positive,abdomen soft and non-tender  Extremities:  No clubbing, cyanosis, edema, or deformity noted  Cervical Nodes:  No lymphadenopathy noted Axillary Nodes:  No palpable lymphadenopathy   Impression & Recommendations:  Problem # 1:  VIRAL URI (ICD-465.9) Assessment New  Patient is recovering from a viral URI. The patient had a stuffy nose, postnasal drip and cough for 1 day that has resolved. The patient was concerned that she had trouble swallowing for 2 days. The patient was instucted that she was getting over a viral URI. Instructed to call if she developed a fever/chills or worsening cough.  Her updated medication list for this problem includes:    Allegra 180 Mg Tabs (Fexofenadine hcl) ..... One tab by mouth daily    8 Hour Pain Relief 650 Mg Cr-tabs (Acetaminophen) ..... One tab by mouth three  times a day, do not drink alcohol with this medication.  Orders: [facility]- Est Level  3 (81191)  Complete Medication List: 1)  Prednisone 5 Mg Tabs (Prednisone) 2)  Ranitidine Hcl 300 Mg Tabs (Ranitidine hcl) .... One tab by mouth two times a day 3)  Allegra 180 Mg Tabs (Fexofenadine hcl) .... One tab by mouth daily 4)  Doxepin Hcl 25 Mg Caps (Doxepin hcl) .... One tab by mouth daily 5)  Oscal 500/200 D-3 500-200 Mg-unit Tabs (Calcium-vitamin d) .... One tab by mouth daily 6)  Vitamin B-6 50 Mg Tabs (Pyridoxine hcl) .... One tab by mouth daily 7)  Glucosamine-chondroitin 250-200 Mg Tabs (Glucosamine-chondroitin) .... One tab by mouth bid 8)  8 Hour Pain Relief 650 Mg Cr-tabs (Acetaminophen) .... One tab by mouth three times a day, do not drink alcohol with this medication.   Orders Added: 1)  [facility]- Est Level  3 [47829]

## 2010-06-05 NOTE — Assessment & Plan Note (Signed)
Summary: throat closing/bmc   Vital Signs:  Patient profile:   77 year old female Height:      64.25 inches Weight:      149 pounds BMI:     25.47 Temp:     97.7 degrees F oral Pulse rate:   74 / minute BP sitting:   116 / 69  (left arm) Cuff size:   regular  Vitals Entered By: Tessie Fass CMA (May 07, 2010 9:19 AM) CC: difficulty swalloing   Primary Care Provider:  Rodney Langton MD  CC:  difficulty swalloing.  History of Present Illness: Chloe Lopez has had continued dysphagia like symptoms now for around 2 weeks. She feels well otherwise. She feels like her throat has somethine stuck in it. She denies any trouble swallowing solids or liquids. She denies any pain with swallowing. She denies any horse voice but does not that her voice is lower which she attributes to her cold. She denies any lumps or bumps or fevers or chills. She thinks this does not feel like her hives and angioedema which she has had in the past. She denies any lumps or bumps in her neck or axilla.  She does note a resolving post nasal drip with her cold.  Upon review she has had a 10 lb wt loss since march of 2011, but she has changed her diet.   Habits & Providers  Alcohol-Tobacco-Diet     Tobacco Status: quit > 6 months  Current Problems (verified): 1)  Dysphagia Unspecified  (ICD-787.20) 2)  Wrist Drop  (ICD-736.05) 3)  Infiltrating Ductal Carcinoma, Left Breast  (ICD-174.9) 4)  Urticaria-hives  (ICD-708.9) 5)  Osteopenia  (ICD-733.90) 6)  Angioedema  (ICD-995.1)  Current Medications (verified): 1)  Prednisone 5 Mg Tabs (Prednisone) 2)  Ranitidine Hcl 300 Mg Tabs (Ranitidine Hcl) .... One Tab By Mouth Two Times A Day 3)  Allegra 180 Mg Tabs (Fexofenadine Hcl) .... One Tab By Mouth Daily 4)  Doxepin Hcl 25 Mg Caps (Doxepin Hcl) .... One Tab By Mouth Daily 5)  Oscal 500/200 D-3 500-200 Mg-Unit Tabs (Calcium-Vitamin D) .... One Tab By Mouth Daily 6)  Vitamin B-6 50 Mg Tabs (Pyridoxine Hcl) ....  One Tab By Mouth Daily 7)  Glucosamine-Chondroitin 250-200 Mg Tabs (Glucosamine-Chondroitin) .... One Tab By Mouth Bid 8)  8 Hour Pain Relief 650 Mg Cr-Tabs (Acetaminophen) .... One Tab By Mouth Three Times A Day, Do Not Drink Alcohol With This Medication. 9)  Fluticasone Propionate 50 Mcg/act Susp (Fluticasone Propionate) .... 2 Spray Each Nostril Daily.  Allergies (verified): No Known Drug Allergies  Past History:  Past Medical History: Last updated: 06/14/2009 ANA 9/03, Intermittent angioedema - responds to prednisone Invasive Ductal Carcinoma:  s/p lumpectomy and neg margins, negative axillary nodes, declined tamoxifen adjuvant.  Past Surgical History: Last updated: 06/14/2009 Cataract surgery - 07/03/2002 hemorrhoidectomy - 06/04/2002,  Hysterectomy - 05/04/1972 02/27/2008 Needle biopsy of suspicious calcifications in left breast. 02/27/2008 Biopsy consistent with invasive ductal carcinoma  Family History: Last updated: 07/01/2006 No family history of cancer, diabetes, or hypertension.  Social History: Last updated: 06/24/2009 Patient is a retired PhD Psychologist, counselling, having done Producer, television/film/video at [employer], and [employer].; Recently moved back to area from [place], [place], and [place], [place].; Lives alone in a guest house on a large estate occupied by close friends; daughter lives >1 mile away.  Has 3 daughters.  Divorced x 20 years.  Ex-husband was head of Infectious Disease at [facility]  2/21>>Was getting [facility] home  care but refused services and they discharged her.  Risk Factors: Smoking Status: quit > 6 months (05/07/2010)  Review of Systems       The patient complains of weight loss.  The patient denies anorexia, fever, weight gain, chest pain, syncope, dyspnea on exertion, abdominal pain, hematochezia, severe indigestion/heartburn, suspicious skin lesions, difficulty walking, depression, and enlarged lymph nodes.    Physical Exam  General:  Well-developed,well-nourished,in no  acute distress; alert,appropriate and cooperative throughout examination Mouth:  no pharyngeal erythema or exudate noted. MMM.   Cobblestoneing noted.  Neck:  No deformities, masses, or tenderness noted. Lungs:  Normal respiratory effort, chest expands symmetrically. Lungs are clear to auscultation, no crackles or wheezes. Heart:  Normal rate and regular rhythm. S1 and S2 normal without gallop, murmur, click, rub or other extra sounds. Abdomen:  Bowel sounds positive,abdomen soft and non-tender  Extremities:  No clubbing, cyanosis, edema, or deformity noted  Cervical Nodes:  No lymphadenopathy noted Axillary Nodes:  No palpable lymphadenopathy   Impression & Recommendations:  Problem # 1:  DYSPHAGIA UNSPECIFIED (ICD-787.20) Assessment New  Not sure of the etiology. Obviously new dysphagia type symptoms in a 77 year old woman that is associated with weight loss is concerning. However these symptoms also seem to be related to a resolving cold +/- post nasal drip.  I discussed the options with Chloe Depaola and Dr. [place].  At this time well will plan for referral to GI for eval of EGD. Perhaps she may benifit from ENT referral as well if GI does not feel EGD is warranted.  Will also attempt to treat post nasal drip with fluticisone nasal spray. If this works and her symptoms resolve we will hold on the referral.   Orders: Gastroenterology Referral (GI) [facility]- Est Level  3 ([address])  Complete Medication List: 1)  Prednisone 5 Mg Tabs (Prednisone) 2)  Ranitidine Hcl 300 Mg Tabs (Ranitidine hcl) .... One tab by mouth two times a day 3)  Allegra 180 Mg Tabs (Fexofenadine hcl) .... One tab by mouth daily 4)  Doxepin Hcl 25 Mg Caps (Doxepin hcl) .... One tab by mouth daily 5)  Oscal 500/200 D-3 500-200 Mg-unit Tabs (Calcium-vitamin d) .... One tab by mouth daily 6)  Vitamin B-6 50 Mg Tabs (Pyridoxine hcl) .... One tab by mouth daily 7)  Glucosamine-chondroitin 250-200 Mg Tabs (Glucosamine-chondroitin)  .... One tab by mouth bid 8)  8 Hour Pain Relief 650 Mg Cr-tabs (Acetaminophen) .... One tab by mouth three times a day, do not drink alcohol with this medication. 9)  Fluticasone Propionate 50 Mcg/act Susp (Fluticasone propionate) .... 2 spray each nostril daily.  Patient Instructions: 1)  Thank you for seeing me today. 2)  We will contact you about when you appointment for the GI doctor is.  3)  Please use the fluticisone nasal spray 2 sprays each nostril daily for at least 2 weeks. If you get better cancel your GI appointment.  4)  Follow up in 4 weeks or a week after your GI appiontment.  5)  If you have chest pain, difficulty breathing, fevers over 102 that does not get better with tylenol please call us or see a doctor.  Prescriptions: FLUTICASONE PROPIONATE 50 MCG/ACT SUSP (FLUTICASONE PROPIONATE) 2 spray each nostril daily.  #1 x 1   Entered and Authorized by:   Clementeen Graham MD   Signed by:   Clementeen Graham MD on 05/07/2010   Method used:   Electronically to  [facility] (retail)       [name]       [place], [place]  [address]       Ph: [phone] or [phone]       Fax: [phone]   RxID:   [id number]    Orders Added: 1)  Gastroenterology Referral [GI] 2)  [facility]- Est Level  3 [[address]95]

## 2010-06-05 NOTE — Miscellaneous (Signed)
Summary: GI appt   Clinical Lists Changes called pt, says she will return call when she gets home. she has appt with Dr Barrett Shell GI this Friday January 6. office is located [address], [address]. phone # [phone].Marland KitchenMarland KitchenTessie Fass CMA  May 07, 2010 11:44 AM  pt notified of appt.Marland KitchenMarland KitchenTessie Fass CMA  May 07, 2010 1:39 PM

## 2010-07-23 LAB — URINE MICROSCOPIC-ADD ON

## 2010-07-23 LAB — URINALYSIS, ROUTINE W REFLEX MICROSCOPIC
Glucose, UA: NEGATIVE mg/dL
Leukocytes, UA: NEGATIVE
Nitrite: NEGATIVE
Specific Gravity, Urine: 1.007 (ref 1.005–1.030)
pH: 5.5 (ref 5.0–8.0)

## 2010-07-23 LAB — CARDIAC PANEL(CRET KIN+CKTOT+MB+TROPI)
Relative Index: 0.9 (ref 0.0–2.5)
Troponin I: 0.01 ng/mL (ref 0.00–0.06)

## 2010-07-23 LAB — POCT I-STAT, CHEM 8
BUN: 10 mg/dL (ref 6–23)
Chloride: 111 mEq/L (ref 96–112)
Hemoglobin: 15.6 g/dL — ABNORMAL HIGH (ref 12.0–15.0)
Sodium: 141 mEq/L (ref 135–145)

## 2010-07-23 LAB — BRAIN NATRIURETIC PEPTIDE: Pro B Natriuretic peptide (BNP): 149 pg/mL — ABNORMAL HIGH (ref 0.0–100.0)

## 2010-07-23 LAB — POCT CARDIAC MARKERS

## 2010-07-23 LAB — CK TOTAL AND CKMB (NOT AT ARMC)
CK, MB: 4.1 ng/mL — ABNORMAL HIGH (ref 0.3–4.0)
Total CK: 241 U/L — ABNORMAL HIGH (ref 7–177)

## 2010-09-16 NOTE — Op Note (Signed)
Chloe Lopez, Chloe Lopez                 ACCOUNT NO.:  [id number]   MEDICAL RECORD NO.:  [id number]          PATIENT TYPE:  AMB   LOCATION:  SDS                          FACILITY:  [facility]   PHYSICIAN:  Angelia Mould. Derrell Lolling, M.D.DATE OF BIRTH:  06-17-1923   DATE OF PROCEDURE:  03/19/2008  DATE OF DISCHARGE:                               OPERATIVE REPORT   PREOPERATIVE DIAGNOSIS:  Invasive ductal carcinoma, left breast.   POSTOPERATIVE DIAGNOSIS:  Invasive ductal carcinoma, left breast.   OPERATION PERFORMED:  1. Injection of blue dye, left breast.  2. Left partial mastectomy with needle localization.  3. Re-excision of inferior margin, left breast lumpectomy site.  4. Left axillary sentinel node biopsy.   SURGEON:  Angelia Mould. Derrell Lolling, MD   OPERATIVE INDICATIONS:  This is an 77 year old white female who had  screening mammograms.  They found a 4-mm grouping of microcalcifications  in the medial left breast at the 9 o'clock position above the nipple and  areola.  Stereotactic core biopsy showed invasive ductal carcinoma, low-  grade.  MRI showed that there was a solitary abnormal area of  enhancement in the left medial breast, but no other abnormalities on  either side and no adenopathy.  She was interested in breast  conservation.  She underwent needle localization this morning at the  [facility].  She is brought to the operating room  electively.   OPERATIVE FINDINGS:  The localizing needle entered the left breast in  the upper inner quadrant at about the 10:30 position, it was directed  medially and  posteriorly.  The metal marker clip appeared to be right  along the shaft of the wire about 1.5 cm proximal to the tip of the  hook, slightly superficial to this.  The initial specimen mammogram  showed no metallic marker.  The radiologist said that they were unsure  why as it appeared that the specimen showed the contained metal marker  and there was plenty of tissue all  around the localizing wire.  We took  a second specimen of tissue inferiorly and there was no metal marker in  that tissue either.  The initial lumpectomy specimen was examined by Dr.  Colonel Bald in the lab and he cut the specimen in several places and did not  find any abnormal tissue on brief inspection.  All 4 sentinel lymph  nodes that were sent were negative for cancer on imprint cytology.  Although we did not identify the metallic marker and either specimen,  the radiologist felt confident that the cancer should be within the  specimen as the wire was in good position in the first specimen that was  removed and so no further tissue was taken.  In the process of  performing the 2 excisions, a fairly generous portion of the upper inner  quadrant of the breast was taken all the way down to the pectoralis  fascia.   OPERATIVE TECHNIQUE:  The patient underwent wire localization at the  [facility] and was sent to [facility].  She  underwent a review  of her x-rays and injection of radionuclide in the  holding area.  She was taken to operating room where she underwent  general anesthesia.  Intravenous antibiotics were given.  The patient  was identified as correct patient, correct procedure, and correct site  and a surgical time-out was held.  The left breast and left axilla and  chest wall were prepped and draped in a sterile fashion.  Marcaine 0.5%  with epinephrine was used as local infiltration anesthetic.   I reviewed the x-rays one more time and observed the localizing wire  entering the upper inner quadrant of the breast.  I marked on the skin  where the cancer should be and made a curved circumareolar incision in  the upper outer quadrant.  Dissection was carried down through the skin  and subcutaneous tissue and then widely around the localizing wire.  This dissection was taken all the way down to the pectoralis fascia.  The specimen was marked and oriented  with the 6-color margin marker kit.  The specimen was sent to the [facility].  They said  that the entire wire was contained within the center of the specimen,  but they did not see the metallic marker.  Specimen was sent to the lab  and Dr. Colonel Bald cut into the specimen in several areas and did not see a  cancer.  I chose to re-excise the inferior margin, as I had taken  essentially all the breast tissue medially and superiorly.  I took  another generous slice of tissue inferiorly, taking it down closer to  the level of the nipple and areola.  This specimen was also marked with  a 6-color margin marker kit.  It was also sent to the [facility].  They did x-ray this specimen as well and did not see the  metallic marker.  Specimen was sent for routine histology.  The case was  discussed once again with Dr. Colonel Bald.  I felt that further tissue removal  was inappropriate at this time. This wound was irrigated with saline.  Hemostasis was excellent and achieved with electrocautery.  The skin was  closed with running subcuticular suture of 4-0 Monocryl and Dermabond.   Attention was directed to the left axilla.  Using the NeoProbe, I  identified the area of most of the radioactivity.  I made a transverse  incision at the hairline and dissection was carried down through the  subcutaneous tissue.  I incised the clavipectoral fascia and using the  NeoProbe, I identified 4 very hot sentinel nodes.  The first 2 had blue  dye, the second 2 did not, but these sentinel nodes all had 50-100 times  background activity.  All 4 sentinel nodes were sent.  There was also  some supplemental fatty tissue that I sent for routine histology.  Dr.  Colonel Bald examined all 4 sentinel nodes by imprint cytology and said that all  4 were negative for cancer.  The left axillary wound was irrigated with  saline.  Hemostasis was excellent and had been achieved with  electrocautery and metal clips.   The deeper tissue was closed with  interrupted sutures of 3-0 Vicryl and the skin closed with running  subcuticular suture of 4-0 Monocryl and Dermabond.  Clean bandages were  placed and the patient was taken to the recovery room in stable  condition.  Estimated blood loss was about 30 mL.  Complications none.  Sponge, needle, and instrument counts were correct.  Angelia Mould. Derrell Lolling, M.D.  Electronically Signed     HMI/MEDQ  D:  03/19/2008  T:  03/20/2008  Job:  [id number]   cc:   Rodney Langton, MD  Norva Pavlov, M.D.

## 2010-09-19 NOTE — Discharge Summary (Signed)
NAMESCOTTI, MOTTER                 ACCOUNT NO.:  [id number]   MEDICAL RECORD NO.:  [id number]          PATIENT TYPE:  INP   LOCATION:  3007                         FACILITY:  [facility]   PHYSICIAN:  Henri Medal, MDDATE OF BIRTH:  Sep 30, 1923   DATE OF ADMISSION:  05/21/2004  DATE OF DISCHARGE:  05/23/2004                                 DISCHARGE SUMMARY   ADMISSION DIAGNOSES:  1.  Rule out transient ischemic attack.  2.  Osteoporosis.  3.  Idiopathic angioedema.   DISCHARGE MEDICATIONS:  1.  Aspirin 81 mg one tablet p.o. daily.  2.  Boniva 150 mg one p.o. every month.   ADMISSION HISTORY:  A 77 year old white female who was admitted for  evaluation of episode of abnormal head sensation, blurred vision and some  gait difficulties.  Ms. Asbill stated that she woke up fine this morning and  at 11 a.m. noted that she felt a vice-like sensation her head and she felt  some discomfort with it.  The patient also notices that her vision was  blurred and when she looked at distant objects they did not appear to be  quite clear.  The patient denies headaches, vertigo or slurred speech.   She also noticed numbness in all four extremities, but she is vague about  description.  The patient notices that she was off balance when she was  walking.  Denied any extremity weakness or double vision. Denied prior  history of strokes or TIA.  Although six weeks ago the patient had a sudden  onset of vertigo with head moving to the left.  This was relieved when she  moved her back to the right.  At that time, she had similar right leg  sensation, but to a lesser degree.  The patient is also not sure whether her  balance was off.  She was seen by her primary physician and she was told  that this was secondary to fluid behind the ear.  The patient was given  Dramamine, but she did not tolerate it.  The symptoms improved within one  day and have not recurred since then.   SIGNIFICANT PHYSICAL  EXAMINATION FINDINGS:  VITAL SIGNS:  The patient was  afebrile.  Pulse 52, regular sinus.  Blood pressure 145/78, respiratory rate  16.  Distal pulses are __________.  HEENT:  Atraumatic, normocephalic.  ENT unremarkable.  NECK:  Supple without bruit.  LUNGS:  Clear to auscultation bilaterally.  CARDIOVASCULAR:  No murmur or gallop.  NEUROLOGIC:  The patient is pleasant, awake, alert and cooperative.  There  is no aphasia, apraxia or dysarthria.  Pupils are equal and reactive.  EOM  full range without nystagmus.  The face is symmetric.  Palatal movement are  normal.  Tongue is midline.  The motor system examination reveals no upper  extremity drift.  Symmetric strength, tone, reflexes, coordination and  sensation.  Gait was not tested, but she was able to sit up without any  dizziness.   LABORATORY DATA:  A wbc 7.4, hemoglobin 15, hematocrit 43.5, platelets 285.  Sodium 139, potassium 3.6,  chloride 108, bicarb 23, BUN 16, glucose 180.  UA  negative for nitrates or leukocyte esterase.   EKG showed sinus bradycardia.   CT of the head without contrast showed a right lateral capsule lacunar  infarct which was subacute versus acute.   HOSPITAL COURSE:  PROBLEM #1 -  TRANSIENT ISCHEMIC ATTACK:  Given the  clinical presentation of her CT findings, neurology, (Dr. Pearlean Brownie) was  consulted.  Per neurology, the patient was admitted for further evaluation  for cerebrovascular disease.  A MRI of the brain showed (1) nonspecific  white matter changes supratentorially in the left paramedian pons.  These  are nonspecific.  Differential considerations would include ischemic gliosis  related to small basal type disease and could be related to  hypertension/diabetes.  Less likely demyelinating illness or vasculitis; (2)  tiny foci of fluid signal in the basal ganglia, most likely representing  prominent perivascular spaces; (3) mild sinusitis changes ethmoids and the  left frontal sinus with probable  mucous retention cyst in the left maxillary  sinus; (4) mild thickening of mucus in the mastoid air cells, right greater  than left.  This may represent inflammatory reaction with possible  separation.  MRA of the brain showed moderate decrease in caliber of the  left middle cerebral artery  proximally and the left anterior cerebral  artery suggestive of approximately 50% stenosis.  No intracranial aneurysm.  MRI of the cervical spine showed degenerative disc disease of the cervical  spine.  Cerebrovascular evaluation showed no significant ICA stenosis,  vertebral artery flow antegrade.  Fasting lipid profile:  Cholesterol 149,  triglycerides 86, HDL 62, LDL 70, VLDL 17, total cholesterol/HDL ratio 2.4.  Homocystine 9.71 (normal).  Hemoglobin A1C 5.3.  This extensive stroke work-  up was reviewed by family practice teaching service and neurology (Dr.  Pearlean Brownie).  Per neurology, the patient had a posterior circulation TIA.  Throughout the hospitalization the patient has been clinically stable.  The  patient has 2-D echocardiogram  pending.  The patient will be discharged  home on Aspirin as prophylaxis.  The patient will arrange for an appointment  to see Dr. Pearlean Brownie, neurology, who agreed to follow up this case as an  outpatient in two months.   PROBLEM #2 -  OSTEOPOROSIS:  The patient is discharged home on Boniva 150 mg  one tablet p.o. q. month.  The patient has been instructed to take this pill  60 minutes before any food or drink and to avoid lying down for one hour.  Boniva was prescribed per request of the patient.   PROBLEM #3 -  IDIOPATHIC ANGIOEDEMA:  This patient has a history of  idiopathic angioedema and home medication for issues are Benadryl and Epi-  Pen.  During hospitalization the patient had allergic reaction on the IV  site.  The IV site became swollen and erythematous.  IV was discontinued. The patient received prednisone 20 mg p.o. daily, Benadryl 50 mg p.r.n.  On  the  day of discharge edema and erythema on IV sites improved remarkably.  The patient was concerned that this was going to be an issue for her in the  future and I have explained it in case she reacts again to IV site, she can  receive prednisone for allergy reactions.  In case this patient needs an IV  placement in the future, this discharge summary has recorded that this  patient reacts to IV placement, and the patient will communicate to  physician and person that  she has these kinds of reactions.   CONDITION ON DISCHARGE:  Good.   DISPOSITION:  Discharge home.   FOLLOW UP:  The patient needs to call Dr. Marlis Edelson office, [phone], to  arrange a follow-up appointment in two months.  A 2-D echocardiogram  is  pending and to be reviewed by Dr. Pearlean Brownie as an outpatient.       FIM/MEDQ  D:  05/23/2004  T:  05/23/2004  Job:  [id number]   cc:   Pramod P. Pearlean Brownie, MD  Fax: [phone]

## 2010-09-19 NOTE — Op Note (Signed)
NAMEAYSSA, BENTIVEGNA                           ACCOUNT NO.:  [id number]   MEDICAL RECORD NO.:  [id number]                   PATIENT TYPE:  OIB   LOCATION:  2899                                 FACILITY:  [facility]   PHYSICIAN:  Guadelupe Sabin, M.D.             DATE OF BIRTH:  [birth year]   DATE OF PROCEDURE:  06/13/2002  DATE OF DISCHARGE:  06/13/2002                                 OPERATIVE REPORT   PREOPERATIVE DIAGNOSES:  1. Senile nuclear cataract left eye.  2. Chronic angle glaucoma left eye.   OPERATION:  Planned extracapsular cataract extraction, phacoemulsification,  primary insertion of posterior chamber intraocular lens implant left eye.   SURGEON:  Guadelupe Sabin, M.D.   ASSISTANT:  Nurse.   ANESTHESIA:  Local 4% Xylocaine, 0.75% Marcaine.  Anesthesia standby  required.  The patient was given sodium Pentothal intravenously during the  period of retrobulbar injection.   DESCRIPTION OF PROCEDURE:  After the patient was prepped and draped, a lid  speculum was inserted in the left eye.  The eye was turned downward and a  superior rectus traction suture placed.  Schiotz tonometry was recorded and  felt to be within normal limits.  A peritomy was performed adjacent to the  limbus from 11 to 1 o'clock position.  The corneoscleral junction was  cleaned and a corneoscleral groove made with a 45 degree Superblade.  The  anterior chamber was then entered with the 2.5 mm diamond keratome at the 12  o'clock position and a 15 degree blade at the 2:30 position.  Using a bent  26 gauge needle on a Healon syringe, a circular capsulorhexis was begun and  then completed with Grabow forceps.  Hydrodissection and hydrodelineation  were performed using 1% Xylocaine.  A 30 degree phacoemulsification tip was  then inserted with slow controlled emulsification of the lens nucleus.  Total ultrasonic time 1 minute 7 second.  Average power level 19%.  Total  amount of fluid used was 60  cc.   Following removal of the nucleus, the residual cortex was aspirated with the  irrigation/aspiration tip.  The posterior capsule appeared intact with a  brilliant red fundus reflex.  It was therefore elected to insert an [employer]er[employer]  medical optics [id number] silicone three piece posterior chamber intraocular  lens implant.  Diopter strength +21.50.  This was inserted with the  [employer] forceps into the anterior chamber and then centered into the  capsular bag using the [facility] lens rotator.  The lens appeared to be well  centered.  The Healon which had been used throughout the procedure was  aspirated and replaced with balanced salt solution and Miochol ophthalmic  solution.  The operative incisions appeared to be self-sealing and no  sutures were required.  A light Maxitrol ointment was instilled in the  conjunctival cul-de-sac and a light patch and protector shield applied.   Duration of procedure and  anesthesia administration was 45 minutes.  The  patient tolerated the procedure well in general and left the operating room  for the recovery room in good condition.                                               Guadelupe Sabin, M.D.   HNJ/MEDQ  D:  08/20/2002  T:  08/20/2002  Job:  [id number]

## 2010-09-19 NOTE — Op Note (Signed)
   NAMEKINA, SHIFFMAN                           ACCOUNT NO.:  [id number]   MEDICAL RECORD NO.:  [id number]                   PATIENT TYPE:  AMB   LOCATION:  DAY                                  FACILITY:  [facility]   PHYSICIAN:  Vikki Ports, M.D.         DATE OF BIRTH:  March 06, 1924   DATE OF PROCEDURE:  04/11/2002  DATE OF DISCHARGE:                                 OPERATIVE REPORT   PREOPERATIVE DIAGNOSES:  Grade 2 internal hemorrhoids.   POSTOPERATIVE DIAGNOSES:  Grade 2 internal hemorrhoids.   PROCEDURE:  Complete hemorrhoidectomy with PPH stapling device.   SURGEON:  Vikki Ports, M.D.   ASSISTANTSheppard Plumber. Earlene Plater, M.D.   ANESTHESIA:  General.   DESCRIPTION OF PROCEDURE:  The patient was taken to the operating room,  placed in the supine position and after adequate anesthesia was induced  using the endotracheal tube, the patient was placed in the prone jackknife  position. Perianal and rectal prep were undertaken. Using the anoscope, a  running 2-0 Prolene suture was placed approximately 4 cm proximal to the  dentate line. This was done circumferentially carefully lifting up mucosa  only. The sutures were then passed through the anoscope and tied around the  PPH anvil. The anvil was then closed, captured in place for 40 seconds. The  vaginal mucosa was checked and was free from tension. The stapler was fired,  held for an additional 40 seconds and then removed. The suture line was  inspected was intact and was not bleeding. Gelfoam packing was placed. All  tissues were injected using 40 cc of 0.5% Marcaine particularly the internal  sphincter. The patient tolerated the procedure well and went to PACU in good  condition.                                               Vikki Ports, M.D.    KRH/MEDQ  D:  04/11/2002  T:  04/11/2002  Job:  [id number]

## 2010-09-19 NOTE — H&P (Signed)
   Chloe Lopez, Chloe Lopez                           ACCOUNT NO.:  [id number]   MEDICAL RECORD NO.:  [id number]                   PATIENT TYPE:  OIB   LOCATION:  2899                                 FACILITY:  [facility]   PHYSICIAN:  Guadelupe Sabin, M.D.             DATE OF BIRTH:  1923-07-14   DATE OF ADMISSION:  07/07/2002  DATE OF DISCHARGE:  07/07/2002                                HISTORY & PHYSICAL   REASON FOR ADMISSION:  This was a planned outpatient readmission of this 77-  year-old white female admitted for cataract implant surgery of the right  eye.   HISTORY OF PRESENT ILLNESS:  This patient has noted deterioration in both  eyes due to progressive cataract formation.  She was previously admitted on  06/07/02 for cataract implant surgery of the left eye.  This was performed  without complication, and the patient was delighted with her vision.  Due to  the blurring of vision, she has requested similar surgery of the right eye  now.  She signed an informed consent and arrangements were made for her  outpatient admission at this time.   PAST MEDICAL HISTORY:  See old chart.  The patient is in stable general  health.   REVIEW OF SYSTEMS:  No cardiorespiratory complaints.   PHYSICAL EXAMINATION:  VITAL SIGNS:  As recorded on admission.  Blood  pressure 111/70, respirations 16, heart rate 65, temperature 98.2.  GENERAL APPEARANCE:  The patient is a pleasant, well-nourished, well-  developed, white female in no acute distress.  HEENT:  Eyes - visual acuity with correction 20/50 right eye, 20/40 without  correction left eye.  The patient is awaiting refraction following her  second cataract surgical procedure for new spectacle lenses.  Applanation  tonometry normal, 18 mm.  Slit lamp examination nuclear cataract right eye,  pseudophakia left eye, well-placed, clear, posterior chamber intraocular  lens implant.  Detailed fundus examination normal.  The patient has had  previous iris  iridectomy surgery for borderline chronic narrow angle  glaucoma.  There is no optic nerve cupping, glaucomatous cupping.  The blood  vessels and macula appear normal.  CHEST:  Lungs clear to percussion and auscultation.  HEART:  Normal sinus rhythm.  No cardiomegaly.  No murmurs.  ABDOMEN:  Negative.  EXTREMITIES:  Negative.   ADMISSION DIAGNOSIS:  Senile nuclear cataract, right eye; pseudophakia, left  eye.   SURGICAL PLAN:  Cataract implant surgery, right eye.                                               Guadelupe Sabin, M.D.    HNJ/MEDQ  D:  07/07/2002  T:  07/07/2002  Job:  [id number]

## 2010-09-19 NOTE — H&P (Signed)
Chloe Lopez, Chloe Lopez                           ACCOUNT NO.:  [id number]   MEDICAL RECORD NO.:  [id number]                   PATIENT TYPE:  OIB   LOCATION:  2899                                 FACILITY:  [facility]   PHYSICIAN:  Guadelupe Sabin, M.D.             DATE OF BIRTH:  12-03-1923   DATE OF ADMISSION:  06/13/2002  DATE OF DISCHARGE:  06/13/2002                                HISTORY & PHYSICAL   REASON FOR ADMISSION:  This was a planned outpatient surgical admission of  this 77 year old white female admitted for cataract implant surgery of the  left eye.   HISTORY OF PRESENT ILLNESS:  This patient was first seen in my office on  July 28, 2001 complaining of decreased vision in both eyes with difficulty  in driving and seeing road signs for at least one year.  Her current glasses  were ineffective and she was only wearing them for reading.   Examination revealed a visual acuity without correction of 20/50 right eye  and 20/50 left eye with near vision of 20/65 in both eyes without correction  and 20/100 in both eyes with correction.  Applanation tonometry 18 mm right  and 15 mm left eye.  Slit lamp examination revealed cataract nuclear type in  both eyes.  The patient was refracted and given temporary glasses.  Detailed  fundus examination was normal.   In follow-up examinations, the patient stated that she was unhappy with her  glasses and that she requested cataract implant surgery.  The patient was  not wearing the bifocal glasses prescribed.  Vision had deteriorated to  20/100 in both eyes.  It was felt that the anterior chamber was quite  shallow in both eyes and that the patient was a borderline, angled closure  glaucoma patient.  Therefore, the patient underwent YAG laser iridotomy  surgery right eye on May 18, 2002 and left eye on May 25, 2002.  This was performed in the office without complication.  It was felt that  patent iridotomies had been achieved and  now the patient could be considered  for cataract implant surgery.  She was given oral discussion and printed  information concerning the procedure and its possible complications. She  signed an informed consent and arrangements were made for her outpatient  admission at this time.   PAST MEDICAL HISTORY:  The patient is in stable general health.  She is said  to have idiopathic angioedema and chronic urticaria.   MEDICATIONS:  One a day vitamins, hydralazine, hydrochloride, and prednisone  for swelling and hives.   ALLERGIES:  She stated that she was allergic to HORSE SERUM and TETANUS but  had no known drug allergies.   REVIEW OF SYMPTOMS:  No cardiorespiratory complaints.   PHYSICAL EXAMINATION:  GENERAL:  The patient is a pleasant 77 year old white  female in no acute distress.  VITAL SIGNS:  Recorded on admission  show a blood pressure 129/72,  respirations 16, pulse 54, temperature 97.1.  HEENT:  Eyes:  Visual acuity 20/100 right eye and 20/100 left eye.  Applanation tonometry 19 mm right eye and 15 mm left eye.  Slit lamp  examination showed dense nuclear cataract formation both eyes.  Fundus  examination normal.  CHEST:  Lungs clear to auscultation and percussion.  HEART:  Normal sinus rhythm.  No cardiomegaly.  No murmurs.  ABDOMEN:  Negative.  EXTREMITIES:  Negative.   ADMISSION DIAGNOSES:  1. Senile cataract both eyes.  2. Chronic angle closure glaucoma both eyes.  3. Status post laser iridotomy surgery both eyes.   PLAN:  Cataract implant surgery left eye now and right eye later in near  future.                                               Guadelupe Sabin, M.D.    HNJ/MEDQ  D:  08/20/2002  T:  08/20/2002  Job:  [id number]

## 2010-09-19 NOTE — Consult Note (Signed)
NAMEALLICE, GARRO                 ACCOUNT NO.:  [id number]   MEDICAL RECORD NO.:  [id number]          PATIENT TYPE:  EMS   LOCATION:  MAJO                         FACILITY:  [facility]   PHYSICIAN:  Pramod P. Pearlean Brownie, MD    DATE OF BIRTH:  30-Nov-1923   DATE OF CONSULTATION:  05/21/2004  DATE OF DISCHARGE:                                   CONSULTATION   REFERRING PHYSICIAN:  Bethann Berkshire, M.D.   HISTORY OF PRESENT ILLNESS:  Ms. Demos is a 77 year old lady who was admitted  for evaluation of episode of abnormal head sensation, blurred vision and  some gait difficulties.  Ms. Minnis states she woke up fine this morning and  at 11 a.m. noticed that she had a vice-like sensation gripping her head and  she had some discomfort with it.  She also noticed that her vision was  blurred and when she looked at distant objects they did not appear to be  quite clear.  She denied any true headache, vertigo or slurred speech.  She  also noticed numbness in all four extremities, but she is vague about the  description.  She also noticed that she was off balance when she was  walking.  She denies any extremity weakness or double vision.  She denies  any known prior history of strokes or TIAs, however, six weeks ago she had  an episode of sudden onset of vertigo with head moving to the left.  This  would relieve when she moved her back to the right.  At that time, she had  similar right leg sensation, but to a lesser degree.  She is also not sure  whether her balance was off.  She was seen by her primary physician and was  told that this was due to fluid behind her ear.  She was given Dramamine,  but she did not tolerate it.  The symptoms improved within one day and have  not recurred since then.   PAST MEDICAL HISTORY:  Significant for recurrent angioedema for which she is  on prednisone and is followed [facility].   PAST SURGICAL HISTORY:  None.   MEDICATION ALLERGIES:  None.   HOME MEDICATIONS:  Prednisone  p.r.n. only.   SOCIAL HISTORY:  The patient is retired and lives at home.  She does smoke  about four to five cigarettes a week.  She has never been a heavy smoker.  She denies drinking alcohol.   REVIEW OF SYSTEMS:  Not significant for chest pain, palpitations, shortness  of breath, fever, cough or diarrhea.   PHYSICAL EXAMINATION:  GENERAL APPEARANCE:  A pleasant, elderly, Caucasian  lady who is not in distress.  VITAL SIGNS:  She is afebrile.  The pulse rate is 52 per minute and regular  sinus.  The blood pressure is 145/78.  The respiratory rate is 16 per  minute.  Distal pulses are well felt.  HEENT:  The head is atraumatic.  ENT unremarkable.  NECK:  Supple without bruit.  CARDIAC:  No murmur or gallop.  LUNGS:  Clear to auscultation.  NEUROLOGIC:  She is pleasant, awake, alert and cooperative.  There is no  aphasia, apraxia or dysarthria.  Pupils are equal and reactive.  EOMs are  full range without nystagmus.  The face is symmetric.  Palatal movements are  normal.  The tongue is midline.  The motor system exam reveals no upper  extremity drift.  Symmetric strength, tone, reflexes, coordination and  sensation.  Gait was not tested, but she was able to sit up without any  dizziness.   DATA REVIEWED:  Noncontrast CT of the head done today reveals an area of low  density in the right posterior limb of the internal capsule which is likely  a silent subacute infarction.  The EKG reveals sinus bradycardia without  acute ischemic findings.   IMPRESSION:  A 77 year old lady with an episode of mild headache with  dizziness and blurred vision likely from posterior circulation transient  ischemic attack.  CT scan shows a silent subcortical right hemispheric  infarction.   PLAN:  The patient is admitted for further evaluation for cerebrovascular  disease.  Recommend MRI scan of the brain with MRA of the brain and neck and  carotid and transcranial Doppler studies.  Check fasting  lipid profile,  homocystine and hemoglobin A1C.  Start aspirin for stroke prevention for  now.  Echocardiogram and telemetry monitoring.  Follow the patient on  consults and make further recommendations as necessary.  I had a long  discussion with the patient and her daughter and answered questions.   Thank you for the referral.       PPS/MEDQ  D:  05/21/2004  T:  05/21/2004  Job:  [id number]   cc:   Penni Bombard, MD  Fax: [phone]

## 2010-09-19 NOTE — Op Note (Signed)
Chloe Lopez, Chloe Lopez                 ACCOUNT NO.:  [id number]   MEDICAL RECORD NO.:  [id number]          PATIENT TYPE:  AMB   LOCATION:  DSC                          FACILITY:  [facility]   PHYSICIAN:  Harvie Junior, M.D.   DATE OF BIRTH:  Jan 03, 1924   DATE OF PROCEDURE:  02/05/2006  DATE OF DISCHARGE:  02/05/2006                               OPERATIVE REPORT   PREOPERATIVE DIAGNOSIS:  Galeazzi style comminuted distal radius  fracture, left.   POSTOPERATIVE DIAGNOSIS:  Galeazzi style comminuted distal radius  fracture, left.   PRINCIPAL PROCEDURE:  1. Open reduction and internal fixation of left distal radius      fracture.  2. Closed reduction and splinting of left distal radioulnar joint      dislocation.   SURGEON:  Harvie Junior, M.D.   ASSISTANT:  Marshia Ly, P.A.   ANESTHESIA:  General.   BRIEF HISTORY:  Chloe Lopez is an 77 year old female with a long history of  having had a fall.  X-rays showed that she had a comminuted distal  radius fracture with the ulna being intact.  It was essentially at the  level of the Galeazzi level.  It is not an intraarticular fracture.  We  talked with her and her family about treatment options and we felt the  most appropriate and only reasonable treatment option would be open  reduction and internal fixation of her distal radius fracture.  We are  obviously concerned about the distal radioulnar joint preoperatively and  plan to assess this intraoperatively as needed.  She was taken to the  operating room for this procedure.   PROCEDURE:  The patient was taken to the operating room.  After adequate  anesthesia was obtained with general anesthetic, the patient was placed  on the operating table.  The left wrist was prepped and draped in the  usual sterile fashion.  Following elevation and exsanguination, the  tourniquet was inflated to 250 mmHg.  Following this a volar incision  was made to allow exposure through a Sherilyn Cooter approach to the  distal  radius.   Once the distal radius was identified, the comminuted pieces on the  volar side were pieced back together and a Synthes distal volar radius  plate was used with small fragment screws distal allowing for three  small fragment screws distally, giving excellent purchase of the distal  fragment and essentially an anatomic reduction was achieved.  With the  patient open at this point, we evaluated the distal radioulnar joint.  It, unfortunately, when we going through range of motion, dislocated  posteriorly.   At that point we felt that had options of either pinning or externally  fixing the distal radioulnar joint but felt that through further  evaluation it was somewhat stable and certainly in supination it seemed  to be very stable.  At that point the wound was copiously irrigated and  suctioned dry.  The wound was closed in layers and the patient was  placed into a long arm supination splint.  Images were taken throughout  the case and then also  when the patient was  splinted to make sure the distal radial joint was intact and in place.  In fact, it showed anatomic alignment.  The patient, at that time, was  taken to the recovery room.  She was noted to be in satisfactory  condition.   ESTIMATED BLOOD LOSS:  Estimated blood loss for the procedure was none.      Harvie Junior, M.D.  Electronically Signed     JLG/MEDQ  D:  05/12/2006  T:  05/13/2006  Job:  [id number]

## 2010-09-19 NOTE — H&P (Signed)
Chloe Lopez, Chloe Lopez NO.:  [id number]   MEDICAL RECORD NO.:  [id number]          PATIENT TYPE:  INP   LOCATION:  1843                         FACILITY:  [facility]   PHYSICIAN:  Asencion Partridge, M.D.     DATE OF BIRTH:  10/30/1923   DATE OF ADMISSION:  05/21/2004  DATE OF DISCHARGE:                                HISTORY & PHYSICAL   CHIEF COMPLAINT:  Dizzy.   HISTORY OF PRESENT ILLNESS:  This is a lovely 77 year old woman who had a  sudden onset of dizziness and unsteadiness on her feet this morning at 11:30  a.m. while at the bookstore.  The patient had experienced this sensation  once before about six weeks ago.  She was prescribed Antivert for presumed  labyrinthitis at that time.  She felt that this sensation was different,  however, and her daughter brought her to the Emergency Department at [facility].  A head CT without contrast showed an acute subacute right  internal capsule lacunar infarct.  She was evaluated by stroke M.D. who  recommended management by our service.  She denies pain, weakness, numbness,  or tingling, and her dizziness has since resolved.   PAST MEDICAL HISTORY:  1.  She had a hemorrhoidectomy in 2003.  2.  Cataract surgery in 2005.  3.  Idiopathic recurrent angioedema.  4.  Osteoporosis.   ALLERGIES:  NKDA.   MEDICATIONS:  Epipen and Benadryl p.r.n. for recurrent angioedema.   SOCIAL HISTORY:  This woman is retired and lives independently in [place]  with support from her three children.  She drives a car.  She smokes  approximately five cigarettes per week.  She does not use alcohol or illicit  drugs.   FAMILY HISTORY:  Her mother died in her 1s of unknown causes.  Her siblings  are healthy including an 72 year old sister who has a pacemaker and is on  Coumadin.   REVIEW OF SYSTEMS:  GENERAL:  Denies fevers, chills, night sweats, or weight  loss.  HEENT:  No vision changes, rhinorrhea, sore throat, or sinus  pressure.  PULMONARY:  No cough or dyspnea.  CARDIOVASCULAR:  No chest pain,  palpitations, or edema.  GI:  No nausea, vomiting, diarrhea, constipation,  bright red blood per rectum, or melena.  GU:  No frequency, urgency, or  dysuria.  NEUROLOGIC:  Positive for dizziness and an electrical sensation in  her fingertips.  No weakness, paralysis, memory deficit.   PHYSICAL EXAMINATION:  VITAL SIGNS:  Temperature 98.1, pulse 54, respiratory  rate 21, O2 sat 98% on room air, BP 156/77.  GENERAL:  This is a very pleasant well-appearing woman in no acute distress.  HEENT:  Mucous membranes are moist.  Oropharynx is not erythematous.  Pupils  are equal, round, reactive to light.  Extraocular movements intact.  NECK:  No anterior cervical or submandibular lymphadenopathy.  No masses.  No thyromegaly.  PULMONARY:  Lungs clear to auscultation bilaterally.  No wheezes, rubs, or  rhonchi.  ABDOMEN:  Soft, and nontender, and nondistended.  Normoactive bowel sounds.  No hepatosplenomegaly.  CARDIOVASCULAR:  The patient is bradycardic with a regular rhythm.  No  gallops, rubs, or murmurs.  No JVD.  EXTREMITIES:  No cyanosis, clubbing, or edema.  Bilateral radial pulses 2+.  Capillary refill is less than three seconds.  NEUROLOGIC:  The patient is alert and oriented x 4.  Cranial nerves II-XII  are intact.  She has no pronator drift.  She has good finger-to-nose, good  rapid alternating movements, good heel-to-shin.  Gait was not assessed.  She  has 2+ symmetrical bilateral patellar reflexes.   ADMISSION LABS:  The patient had a white blood count of 7.4, hemoglobin  15.0, hematocrit 43.5, platelets 285.  Sodium 139, potassium 3.6, chloride  108, bicarb 23, BUN 16, glucose 108.  Her UA was negative for nitrites or  leukocyte esterase.  EKG showed sinus bradycardia.  CT of the head without  contrast showed a right lateral capsule lacunar infarct which was subacute  versus acute.   ASSESSMENT/PLAN:   This is a 77 year old woman status post presumed lacunar  infarct without clinical symptoms, admitted for a stroke workup.   1.  Dizziness likely secondary to subacute infarct.  Per neuro, we will      order MRI MRA of brain and C spine, carotid and transcranial Dopplers,      fasting lipids, hemoglobin A1c, homocystine, and begin 325 mg of aspirin      daily.  We will also order orthostatic vital signs to help rule out      neurocardiogenic causes for dizziness.  If orthostatics are positive,      consider cardiology consult and tilt table test.  2.  Osteoporosis.  The patient was supposed to start Actonel this month but      would like to try the monthly bisphosphonate instead.  We will consult      with the patient's primary.  3.  Health maintenance.  The patient has already received influenza and      Pneumovax this year.  4.  Angioedema.  We will have Epipen and Benadryl available to patient.       TH/MEDQ  D:  05/21/2004  T:  05/21/2004  Job:  [id number]

## 2010-09-19 NOTE — H&P (Signed)
NAME:  Chloe Lopez, Chloe Lopez NO.:  [id number]   MEDICAL RECORD NO.:  [id number]                   PATIENT TYPE:   LOCATION:                                       FACILITY:   PHYSICIAN:  Guadelupe Sabin, M.D.             DATE OF BIRTH:  1924/02/07   DATE OF ADMISSION:  06/13/2002  DATE OF DISCHARGE:                                HISTORY & PHYSICAL   REASON FOR ADMISSION:  This was a planned outpatient surgical admission of  this 77 year old white female admitted for cataract implant surgery of the  left eye.   HISTORY OF PRESENT ILLNESS:  This patient has been followed in my office  since July 28, 2001. Examination at that time revealed that the patient  stated that she had blurred vision with difficulty reading road signs for  over the past year. Examination revealed a visual acuity of 20/50 in each  eye without correction and with refraction, 20/40 in each eye. Slit lamp  examination revealed the eyes to be white and clear with nuclear cataract  formation. A detailed fundus examination revealed a clear vitreous behind  the lens haze. The retina was attached when normal optic nerve blood vessel  and macula was felt. The patient had cataract formation in both eyes and new  bifocal spectacles were prescribed. The patient continued to return, being  unhappy with her glasses. The dense nuclear cataracts were becoming  brunescent in color. It was noted that the anterior chamber was becoming  somewhat shallow due to the nuclear cataracts. Gonioscopy showed a narrow  angle and it was felt that the patient had borderline chronic narrow angle  glaucoma. In view of the forthcoming probable cataract surgery, it was  elected to perform laser iridotomy surgery on each iris, to prevent acute  angle closure. This was carried out in the office with the YAG laser without  complication. Following these procedures, the patient wished to continue  with cataract implant  surgery. She was given oral discussion and printed  information concerning the procedure and it's complications. She signed an  informed consent and arrangements were made for outpatient admission for a  left eye surgery at this time.   PAST MEDICAL HISTORY:  The patient is under the care of Dr. Jonah Blue  at the [facility]. Dr. Ophelia Charter states that the patient  has not recently been seen and the last full examination was in October of  2002.  The patient has chronic lung changes consistent with chronic  obstructive pulmonary disease without evidence of acute cardiopulmonary  disease.   ALLERGIES:  The patient is said to be allergic to horse serum.   REVIEW OF SYSTEMS:  No cardiorespiratory complaints.   PHYSICAL EXAMINATION:  VITAL SIGNS: As recorded on admission, blood pressure  129/72, respiratory rate 16, pulse 54, temperature 97.1.  GENERAL: The patient is a pleasant, well developed,  well nourished, white  female in no acute distress.  HEENT: Eyes, visual acuity with best correction 20/30 right eye, 20/40 left  eye. Without correction 20/100 each eye. The patient does not wear her  glasses. External ocular and slit lamp examination revealed brunescent  nuclear cataracts. Peripheral iridotomies are noted in each eye. Detailed  fundus examination dilated with Mydriacyl ophthalmic solution reveals normal  fundus.  CHEST/LUNGS: Clear to auscultation and percussion.  HEART: Normal sinus rhythm. No cardiomegaly or murmur.  ABDOMEN: Negative.  EXTREMITIES: Negative.   ADMISSION DIAGNOSES:  Senile nuclear cataract, both eyes. Chronic borderline  narrow angle glaucoma both eyes.   SURGICAL PLAN:  Cataract implant surgery, left eye now. Right eye later, as  needed.   OPERATIVE SUMMARY:   PREOPERATIVE DIAGNOSES:  Senile nuclear cataract, left eye. Borderline  chronic narrow angle glaucoma, left eye.   POSTOPERATIVE DIAGNOSES:  Senile nuclear cataract, left  eye. Borderline  chronic narrow angle glaucoma, left eye.   OPERATION:  Planned extracapsular cataract extraction - phacoemulsification,  primary insertion of posterior chamber intraocular lens implant.   SURGEON:  Stormy Fabian, M.D.   ASSISTANT:  Nurse.   ANESTHESIA:  Local with 4% Xylocaine, 0.75% Marcaine, with Wydase added.  Anesthesia standby required in this elderly patient. The patient given  Ditropan or Sodium Phenethyl IV during the period of retrobulbar injection.   OPERATIVE PROCEDURE:  After the patient was prepped and draped, a lid  speculum was inserted in the left eye. The eye was turned downward and a  superior rectus traction suture placed. CS tonometry was recorded at 4-5  scale units with a 5.5 gram weight. A peritomy was performed adjacent to the  limbus from the 11:00 to 1:00 o'clock position. The corneal scleral junction  was cleaned and a corneal scleral groove made with a 45 degree super blade.  The anterior chamber was then entered with the 2.5 mm diamond keratome at  the 12:00 o'clock position and the 15 degree blade at the 2:30 o'clock  position. Using a #26 gauge needle on a Healon syringe, a circular capsular  rhexis was begun and then completed with the gray bow forceps.  Hydrodissection and hydrodelineation were performed using 1% Xylocaine. The  30 degree phacoemulsification tip was then inserted with slow controlled  emulsification with lens cracking and fragmentation. Following removal of  the nucleus, the residual cortex was aspirated with the irrigation  aspiration tip. Total ultrasonic time was one minute and 7 seconds. Average  power level was 19%. Total amount of fluid used was 60 cc's. The posterior  capsule appeared intact with a brilliant red fundus reflex. It was therefore  elected to insert an Allogen medical optics [id number]-[id number] silicone posterior chamber intraocular lens implant. Diopter strength +21.50. This was inserted  with the  McDonald forceps into the anterior chamber and then centered into  the capsular bag using the lens rotator. The lens appeared to be well  centered. The Healon which had been used throughout the procedure was  aspirated and replaced with Balsol solution and Miochol ophthalmic solution.  The operative incisions appeared to be self-sealing and no sutures were  required. Maxitrol ointment was  instilled in the conjunctival cul-de-sac and a light patch and protector  shield applied. Duration of the procedure was 45 minutes. The patient  tolerated the procedure well in general. Left the OR for the recovery room  in good condition.  Guadelupe Sabin, M.D.    HNJ/MEDQ  D:  06/13/2002  T:  06/13/2002  Job:  [id number]   cc:   Jonah Blue, M.D.  Family [facility]ac Resident - [address]  [place], [place] [address]  Fax: [phone]

## 2010-09-19 NOTE — Op Note (Signed)
NAMEJEARLDEAN, GUTT                           ACCOUNT NO.:  [id number]   MEDICAL RECORD NO.:  [id number]                   PATIENT TYPE:  OIB   LOCATION:  2899                                 FACILITY:  [facility]   PHYSICIAN:  Guadelupe Sabin, M.D.             DATE OF BIRTH:  [birth year]   DATE OF PROCEDURE:  DATE OF DISCHARGE:  07/07/2002                                 OPERATIVE REPORT   PREOPERATIVE DIAGNOSES:  1. Senile nuclear cataract, right eye.  2. Borderline narrow angle glaucoma, right eye.   POSTOPERATIVE DIAGNOSES:  1. Senile nuclear cataract, right eye.  2. Borderline narrow angle glaucoma, right eye.   NAME OF OPERATION:  Planned extracapsular cataract extraction -  phacoemulsification, primary insertion of posterior chamber intraocular lens  implant.   SURGEON:  Guadelupe Sabin, M.D.   ASSISTANT:  Nurse.   ANESTHESIA:  Local 4% Xylocaine, 0.75 Marcaine anesthesia standby required.  The patient given sodium Pentothal intravenously during the period of  retrobulbar injection.   OPERATIVE PROCEDURE:  After the patient was prepped and draped, a lid  speculum was inserted in the right eye.  Schiotz tonometry was recorded at 5-  6 scale units with a 5.5 g weight.  A peritomy was performed adjacent to the  limbus from the 11 to 1 o'clock position.  The corneoscleral junction was  cleaned, and a corneal sterile groove made with a 45 degree Superblade.  The  anterior chamber was then entered with a 2.5 mm diamond keratome at the 12  o'clock position, and the 15 degree blade at the 2:30 position.  Using a  bent 26 gauge needle and a Healon syringe, a circular capsulorrhexis was  begun, and then completed with the Graybow forceps.  Hydrodissection and  hydrodelineation were performed using 1% Xylocaine.  The 30 degree  phacoemulsification tip was then inserted with slow controlled  emulsification of the lens nucleus.  Total ultrasonic time 1 minute, 42  seconds.   Average power level 19%.  Total amount of fluid used 90 mL.  Following removal of the nucleus, the residual cortex was aspirated with the  irrigation aspiration tip.  The posterior capsule appeared intact with a  brilliant red fundus reflex.  It was therefore elected to insert an Allergan  Medical Optics [id number].  Posterior chamber intraocular lens implant, diopter  strength +21.50.  This was inserted with the McDonald forceps into the  anterior chamber, and then centered into the capsular bag using the [facility]  lens rotator.  The lens appeared to be well centered.  The Healon which had  been used during the procedure was aspirated and replaced with balanced salt  solution and Miochol ophthalmic solution.  The operative incisions appeared  to be self sealing, and no sutures were require.   DURATION OF PROCEDURE:  45 minutes.   The patient tolerated the procedure well in general.  She left  the operating  room for the recovery room after Maxitrol ointment had been instilled in the  conjunctival cul-de-sac and a light patch and protector shield applied.                                               Guadelupe Sabin, M.D.    HNJ/MEDQ  D:  07/07/2002  T:  07/07/2002  Job:  [id number]

## 2010-10-03 ENCOUNTER — Encounter (INDEPENDENT_AMBULATORY_CARE_PROVIDER_SITE_OTHER): Payer: Self-pay | Admitting: General Surgery

## 2010-10-14 ENCOUNTER — Encounter: Payer: Self-pay | Admitting: Sports Medicine

## 2010-10-14 ENCOUNTER — Ambulatory Visit (INDEPENDENT_AMBULATORY_CARE_PROVIDER_SITE_OTHER): Payer: Medicare Other | Admitting: Sports Medicine

## 2010-10-14 DIAGNOSIS — L509 Urticaria, unspecified: Secondary | ICD-10-CM

## 2010-10-14 DIAGNOSIS — M21339 Wrist drop, unspecified wrist: Secondary | ICD-10-CM

## 2010-10-14 DIAGNOSIS — M899 Disorder of bone, unspecified: Secondary | ICD-10-CM

## 2010-10-14 DIAGNOSIS — Z853 Personal history of malignant neoplasm of breast: Secondary | ICD-10-CM

## 2010-10-14 DIAGNOSIS — M949 Disorder of cartilage, unspecified: Secondary | ICD-10-CM

## 2010-10-14 MED ORDER — CALCIUM CARBONATE-VITAMIN D 500-200 MG-UNIT PO TABS: 2.0000 | ORAL_TABLET | Freq: Two times a day (BID) | ORAL | Status: DC

## 2010-10-14 NOTE — Assessment & Plan Note (Signed)
In remission.

## 2010-10-14 NOTE — Progress Notes (Signed)
  Subjective:    Patient ID: Chloe Lopez, female    DOB: 1923/05/19, 77 y.o.   MRN: [id number]  HPI Doing well, no complaints.  Here for annual exam.  Preventive medicine:  Up to date on all preventive medicine.  Also at age 56, do not need further screening.   Review of Systems    Neg except as in HPI. Objective:   Physical Exam  Constitutional: She appears well-developed and well-nourished. No distress.  HENT:  Head: Normocephalic and atraumatic.  Mouth/Throat: Oropharynx is clear and moist. No oropharyngeal exudate.  Eyes: Conjunctivae and EOM are normal. Pupils are equal, round, and reactive to light.  Neck: Normal range of motion. Neck supple. No JVD present. No tracheal deviation present. No thyromegaly present.  Cardiovascular: Normal rate and regular rhythm.  Exam reveals no gallop and no friction rub.   No murmur heard. Pulmonary/Chest: Effort normal and breath sounds normal. No stridor. No respiratory distress. She has no wheezes. She has no rales. She exhibits no tenderness.  Abdominal: Soft. She exhibits no distension and no mass. There is no tenderness. There is no rebound and no guarding.  Musculoskeletal: She exhibits no edema.  Lymphadenopathy:    She has no cervical adenopathy.  Skin: Skin is warm and dry.          Assessment & Plan:

## 2010-10-14 NOTE — Assessment & Plan Note (Signed)
On Ca/Vit D

## 2010-12-09 ENCOUNTER — Ambulatory Visit: Payer: Medicare Other | Admitting: Home Health Services

## 2010-12-15 ENCOUNTER — Ambulatory Visit (INDEPENDENT_AMBULATORY_CARE_PROVIDER_SITE_OTHER): Payer: Medicare Other | Admitting: Home Health Services

## 2010-12-15 ENCOUNTER — Encounter: Payer: Self-pay | Admitting: Home Health Services

## 2010-12-15 VITALS — BP 123/76 | HR 62 | Temp 98.1°F | Ht 65.0 in | Wt 153.6 lb

## 2010-12-15 DIAGNOSIS — Z0000 Encounter for general adult medical examination without abnormal findings: Secondary | ICD-10-CM

## 2010-12-15 NOTE — Progress Notes (Signed)
Patient here for annual wellness visit, patient reports: Risk Factors/Conditions needing evaluation or treatment: Pt does not have any risk factors that need evaluation. Home Safety: Pt lives by self in 2 story home.  Pt reports having smoke detectors and does not have adaptive equipment in bathroom.  Other Information: Corrective lens: Pt wears corrective lens for reading and visit eye doctor annually. Dentures: Pt has an upper partial, but does not like to wear it.  Visits dentist every 6 months. Memory: Pt denies memory problems other than remembering names. Patient's Mini Mental Score (recorded in doc. flowsheet): 29  Balance/Gait: Pt does not have any noticeable impairments.  Balance Abnormal Patient value  Sitting balance    Sit to stand x Uses arms  Attempts to arise    Immediate standing balance    Standing balance    Nudge    Eyes closed- Romberg    Tandem stance    Back lean    Neck Rotation    360 degree turn    Sitting down x Uses arms   Gait Abnormal Patient value  Initiation of gait    Step length-left    Step length-right    Step height-left    Step height-right x Drags heal  Step symmetry    Step continuity    Path deviation    Trunk movement    Walking stance        Annual Wellness Visit Requirements Recorded Today In  Medical, family, social history Past Medical, Family, Social History Section  Current providers Care team  Current medications Medications  Wt, BP, Ht, BMI Vital signs  Hearing assessment (welcome visit) Hearing/vision  Tobacco, alcohol, illicit drug use History  ADL Nurse Assessment  Depression Screening Nurse Assessment  Cognitive impairment Nurse Assessment  Mini Mental Status Document Flowsheet  Fall Risk Nurse Assessment  Home Safety Progress Note  End of Life Planning (welcome visit) Social Documentation  Medicare preventative services Progress Note  Risk factors/conditions needing evaluation/treatment Progress Note    Personalized health advice Patient Instructions, goals, letter  Diet & Exercise Social Documentation  Emergency Contact Social Documentation  Seat Belts Social Documentation  Sun exposure/protection Social Documentation    Prevention Plan: Discussed shingles vaccine with pt.   Recommended Medicare Prevention Screenings Women over 17 Test For Frequency Date of Last- BOLD if needed  Breast Cancer 1-2 yrs 10/10  Cervical Cancer 1-3 yrs 1999  Colorectal Cancer 1-10 yrs Pt reported done  Osteoporosis once 20/05  Cholesterol 5 yrs 9/09  Diabetes yearly Non diabetic  HIV yearly declined  Influenza Shot yearly 11/11  Pneumonia Shot once 10/03  Zostavax Shot once recommended

## 2010-12-15 NOTE — Patient Instructions (Signed)
1. Continue to focus on eating fruits and vegetables. 2. Continue to stay active with gardening and outside activities. 3. Consider getting shingles vaccine from pharmacy.

## 2010-12-15 NOTE — Progress Notes (Signed)
Subjective:    Patient ID: Chloe Lopez, female    DOB: January 02, 1924, 77 y.o.   MRN: [id number]  HPI      I have reviewed this visit and discussed with Arlys John and agree with her documentation.  Past medical history, social, surgical and family history all reviewed.    Current Vitals             BP Pulse Temp (Src) Resp Ht Wt    123/76  62  98.1 F (36.7 C) (Oral)  N/A  5\' 5"  (1.651 m)  153 lb 9.6 oz (69.673 kg)       BMI SpO2 PF LMP    25.56 kg/m2  N/A  N/A  N/A     Progress Notes     Marigene Ehlers, PA  12/15/2010 11:54 AM  Pended Patient here for annual wellness visit, patient reports: Risk Factors/Conditions needing evaluation or treatment: Pt does not have any risk factors that need evaluation. Home Safety: Pt lives by self in 2 story home.  Pt reports having smoke detectors and does not have adaptive equipment in bathroom.  Other Information: Corrective lens: Pt wears corrective lens for reading and visit eye doctor annually. Dentures: Pt has an upper partial, but does not like to wear it.  Visits dentist every 6 months. Memory: Pt denies memory problems other than remembering names. Patient's Mini Mental Score (recorded in doc. flowsheet): 29   Balance/Gait: Pt does not have any noticeable impairments.  Balance  Abnormal  Patient value   Sitting balance       Sit to stand  x  Uses arms   Attempts to arise       Immediate standing balance       Standing balance       Nudge       Eyes closed- Romberg       Tandem stance       Back lean       Neck Rotation       360 degree turn       Sitting down  x  Uses arms       Gait  Abnormal  Patient value   Initiation of gait       Step length-left       Step length-right       Step height-left       Step height-right  x  Drags heal   Step symmetry       Step continuity       Path deviation       Trunk movement       Walking stance           Annual Wellness Visit Requirements  Recorded  Today In   Medical, family, social history  Past Medical, Family, Social History Section   Current providers  Care team   Current medications  Medications   Wt, BP, Ht, BMI  Vital signs   Hearing assessment (welcome visit)  Hearing/vision   Tobacco, alcohol, illicit drug use  History   ADL  Nurse Assessment   Depression Screening  Nurse Assessment   Cognitive impairment  Nurse Assessment   Mini Mental Status  Document Flowsheet   Fall Risk  Nurse Assessment   Home Safety  Progress Note   End of Life Planning (welcome visit)  Social Documentation   Medicare preventative services  Progress Note   Risk factors/conditions needing evaluation/treatment  Progress Note   Personalized health  advice  Patient Instructions, goals, letter   Diet & Exercise  Social Documentation   Emergency Contact  Social Documentation   Seat Belts  Social Documentation   Sun exposure/protection  Social Documentation        Prevention Plan: Discussed shingles vaccine with pt.    Recommended Medicare Prevention Screenings Women over 8 Test For  Frequency  Date of Last-  BOLD if needed   Breast Cancer  1-2 yrs  10/10   Cervical Cancer  1-3 yrs  1999   Colorectal Cancer  1-10 yrs  Pt reported done   Osteoporosis  once  20/05   Cholesterol  5 yrs  9/09   Diabetes  yearly  Non diabetic   HIV  yearly  declined   Influenza Shot  yearly  11/11   Pneumonia Shot  once  10/03   Zostavax Shot  once  recommended                Electronic signature on 12/15/2010        Not recorded                Patient Instructions     1. Continue to focus on eating fruits and vegetables. 2. Continue to stay active with gardening and outside activities. 3. Consider getting shingles vaccine from pharmacy.      Follow-up and Disposition     Routing History Recorded     All Flowsheet Templates (all recorded)     Encounter Vitals Flowsheet    Custom Formula Data Flowsheet    Anthropometrics Flowsheet      Amb Nursing Assessment Flowsheet    Mini Mental Exam Flowsheet           Hearing Screening  Edited by: Marigene Ehlers, PA        125hz  250hz  500hz  1000hz  2000hz  4000hz  8000hz     Right ear     Fail Fail Fail Fail      Left ear     40 40 Fail Fail      Method: Otoacoustic emissions     Review of Systems     Objective:   Physical Exam        Assessment & Plan:

## 2010-12-16 NOTE — Progress Notes (Signed)
  Subjective:    Patient ID: Chloe Lopez, female    DOB: 11-Nov-1923, 77 y.o.   MRN: [id number]  HPI  I have reviewed this visit and discussed with Arlys John and agree with her documentation.   reviewed Medications, Problem List, Past (Medical, Surgical) Family, Social HistoriesCharge         Review of Systems     Objective:   Physical Exam        Assessment & Plan:

## 2011-01-03 ENCOUNTER — Inpatient Hospital Stay (HOSPITAL_COMMUNITY): Payer: Medicare Other

## 2011-01-03 ENCOUNTER — Other Ambulatory Visit: Payer: Self-pay | Admitting: Family Medicine

## 2011-01-03 ENCOUNTER — Inpatient Hospital Stay (HOSPITAL_COMMUNITY)
Admission: EM | Admit: 2011-01-03 | Discharge: 2011-01-04 | DRG: 948 | Disposition: A | Discharge status: Home / Self Care | Payer: Medicare Other | Attending: Family Medicine | Admitting: Family Medicine

## 2011-01-03 ENCOUNTER — Emergency Department (HOSPITAL_COMMUNITY): Payer: Medicare Other

## 2011-01-03 DIAGNOSIS — M899 Disorder of bone, unspecified: Secondary | ICD-10-CM | POA: Diagnosis present

## 2011-01-03 DIAGNOSIS — L508 Other urticaria: Secondary | ICD-10-CM | POA: Diagnosis present

## 2011-01-03 DIAGNOSIS — R5381 Other malaise: Principal | ICD-10-CM | POA: Diagnosis present

## 2011-01-03 DIAGNOSIS — H5789 Other specified disorders of eye and adnexa: Secondary | ICD-10-CM | POA: Diagnosis present

## 2011-01-03 DIAGNOSIS — IMO0002 Reserved for concepts with insufficient information to code with codable children: Secondary | ICD-10-CM

## 2011-01-03 DIAGNOSIS — Z79899 Other long term (current) drug therapy: Secondary | ICD-10-CM

## 2011-01-03 DIAGNOSIS — I671 Cerebral aneurysm, nonruptured: Secondary | ICD-10-CM | POA: Diagnosis present

## 2011-01-03 DIAGNOSIS — G459 Transient cerebral ischemic attack, unspecified: Secondary | ICD-10-CM

## 2011-01-03 DIAGNOSIS — Z87891 Personal history of nicotine dependence: Secondary | ICD-10-CM

## 2011-01-03 DIAGNOSIS — Z853 Personal history of malignant neoplasm of breast: Secondary | ICD-10-CM

## 2011-01-03 LAB — CBC
HCT: 41.9 % (ref 36.0–46.0)
Hemoglobin: 14 g/dL (ref 12.0–15.0)
MCH: 30.7 pg (ref 26.0–34.0)
MCHC: 33.4 g/dL (ref 30.0–36.0)
MCV: 91.9 fL (ref 78.0–100.0)
Platelets: 288 10*3/uL (ref 150–400)
RBC: 4.56 MIL/uL (ref 3.87–5.11)
RDW: 13.4 % (ref 11.5–15.5)
WBC: 8.3 10*3/uL (ref 4.0–10.5)

## 2011-01-03 LAB — URINALYSIS, ROUTINE W REFLEX MICROSCOPIC
Bilirubin Urine: NEGATIVE
Glucose, UA: NEGATIVE mg/dL
Hgb urine dipstick: NEGATIVE
Ketones, ur: NEGATIVE mg/dL
Nitrite: NEGATIVE
Protein, ur: NEGATIVE mg/dL
Specific Gravity, Urine: 1.009 (ref 1.005–1.030)
Urobilinogen, UA: 0.2 mg/dL (ref 0.0–1.0)
pH: 7.5 (ref 5.0–8.0)

## 2011-01-03 LAB — DIFFERENTIAL
Basophils Absolute: 0 10*3/uL (ref 0.0–0.1)
Basophils Relative: 0 % (ref 0–1)
Eosinophils Absolute: 0 10*3/uL (ref 0.0–0.7)
Eosinophils Relative: 0 % (ref 0–5)
Lymphocytes Relative: 18 % (ref 12–46)
Lymphs Abs: 1.5 10*3/uL (ref 0.7–4.0)
Monocytes Absolute: 0.3 10*3/uL (ref 0.1–1.0)
Monocytes Relative: 4 % (ref 3–12)
Neutro Abs: 6.4 10*3/uL (ref 1.7–7.7)
Neutrophils Relative %: 78 % — ABNORMAL HIGH (ref 43–77)

## 2011-01-03 LAB — COMPREHENSIVE METABOLIC PANEL
ALT: 11 U/L (ref 0–35)
AST: 13 U/L (ref 0–37)
Albumin: 3.3 g/dL — ABNORMAL LOW (ref 3.5–5.2)
Alkaline Phosphatase: 70 U/L (ref 39–117)
BUN: 11 mg/dL (ref 6–23)
CO2: 23 mEq/L (ref 19–32)
Calcium: 9.5 mg/dL (ref 8.4–10.5)
Chloride: 110 mEq/L (ref 96–112)
Creatinine, Ser: 0.56 mg/dL (ref 0.50–1.10)
GFR calc Af Amer: >60 mL/min (ref >60)
GFR calc non Af Amer: >60 mL/min (ref >60)
Glucose, Bld: 104 mg/dL — ABNORMAL HIGH (ref 70–99)
Potassium: 3.9 mEq/L (ref 3.5–5.1)
Sodium: 144 mEq/L (ref 135–145)
Total Bilirubin: 0.2 mg/dL — ABNORMAL LOW (ref 0.3–1.2)
Total Protein: 6.7 g/dL (ref 6.0–8.3)

## 2011-01-03 LAB — BASIC METABOLIC PANEL
BUN: 14 mg/dL (ref 6–23)
CO2: 25 mEq/L (ref 19–32)
Calcium: 9.2 mg/dL (ref 8.4–10.5)
Chloride: 108 mEq/L (ref 96–112)
Creatinine, Ser: 0.6 mg/dL (ref 0.50–1.10)
GFR calc Af Amer: >60 mL/min (ref >60)
GFR calc non Af Amer: >60 mL/min (ref >60)
Glucose, Bld: 124 mg/dL — ABNORMAL HIGH (ref 70–99)
Potassium: 3.8 mEq/L (ref 3.5–5.1)
Sodium: 142 mEq/L (ref 135–145)

## 2011-01-03 LAB — PROTIME-INR
INR: 1.06 (ref 0.00–1.49)
Prothrombin Time: 14 seconds (ref 11.6–15.2)

## 2011-01-03 LAB — URINE MICROSCOPIC-ADD ON

## 2011-01-03 LAB — APTT: aPTT: 27 seconds (ref 24–37)

## 2011-01-03 LAB — MAGNESIUM: Magnesium: 2.1 mg/dL (ref 1.5–2.5)

## 2011-01-03 LAB — TROPONIN I: Troponin I: <0.3 ng/mL (ref <0.30)

## 2011-01-03 LAB — CK TOTAL AND CKMB (NOT AT ARMC)
CK, MB: 1.9 ng/mL (ref 0.3–4.0)
Relative Index: INVALID (ref 0.0–2.5)
Total CK: 20 U/L (ref 7–177)

## 2011-01-03 NOTE — H&P (Signed)
Chloe Lopez is an 77 y.o. female.   Chief Complaint: Generalized weakness and Left eye irritation HPI:  Chloe Lopez is an 77 year old lady who presents to the ER for several hours of left eye irritation and generalized weakness.  She reports that she went to her daughter's for dinner last night and during dinner she felt like there was something in her left eye.  She says it did not affect her vision and her eye was not red.  She says also she felt weakness throughout her body.  She says her daughter had to drive her home, and then her daughter decided she should go to the ER around midnight last night.  Patient says now her weakness has improved and her eye feels better, but is still not feeling normal.  She says now her left eye feels like there is a film over it.    Past Medical History  Diagnosis Date  . Depression   History of Breast cancer 4 years ago, in remission Osteopenia Chronic Urticaria and hives.   Past Surgical History  Procedure Date  . Mastectomy, partial 2009    Family History: Patients parents are deceased, family history non-contributory.  Social History:  reports that she quit smoking about 15 years ago. She has never used smokeless tobacco. She reports that she drinks about one ounce of alcohol per week. She reports that she does not use illicit drugs.  Allergies:  Allergies  Allergen Reactions  . Aspirin     No current facility-administered medications on file as of 01/03/2011.   Medications Prior to Admission  Medication Sig Dispense Refill  . calcium-vitamin D (OSCAL WITH D) 500-200 MG-UNIT per tablet Take 2 tablets by mouth 2 (two) times daily.      Marland Kitchen doxepin (SINEQUAN) 25 MG capsule Take 25 mg by mouth daily.        . predniSONE (DELTASONE) 5 MG tablet Take 5 mg by mouth.        . ranitidine (ZANTAC) 300 MG tablet Take 300 mg by mouth 2 (two) times daily.          Results for orders placed during the hospital encounter of 01/03/11 (from the past 48 hour(s))   DIFFERENTIAL     Status: Abnormal   Collection Time   01/03/11  4:11 AM      Component Value Range Comment   Neutrophils Relative 78 (*) 43 - 77 (%)    Neutro Abs 6.4  1.7 - 7.7 (K/uL)    Lymphocytes Relative 18  12 - 46 (%)    Lymphs Abs 1.5  0.7 - 4.0 (K/uL)    Monocytes Relative 4  3 - 12 (%)    Monocytes Absolute 0.3  0.1 - 1.0 (K/uL)    Eosinophils Relative 0  0 - 5 (%)    Eosinophils Absolute 0.0  0.0 - 0.7 (K/uL)    Basophils Relative 0  0 - 1 (%)    Basophils Absolute 0.0  0.0 - 0.1 (K/uL)   CBC     Status: Normal   Collection Time   01/03/11  4:11 AM      Component Value Range Comment   WBC 8.3  4.0 - 10.5 (K/uL)    RBC 4.56  3.87 - 5.11 (MIL/uL)    Hemoglobin 14.0  12.0 - 15.0 (g/dL)    HCT 96.0  45.4 - 09.8 (%)    MCV 91.9  78.0 - 100.0 (fL)    MCH 30.7  26.0 - 34.0 (pg)    MCHC 33.4  30.0 - 36.0 (g/dL)    RDW 29.5  28.4 - 13.2 (%)    Platelets 288  150 - 400 (K/uL)   BASIC METABOLIC PANEL     Status: Abnormal   Collection Time   01/03/11  4:11 AM      Component Value Range Comment   Sodium 142  135 - 145 (mEq/L)    Potassium 3.8  3.5 - 5.1 (mEq/L)    Chloride 108  96 - 112 (mEq/L)    CO2 25  19 - 32 (mEq/L)    Glucose, Bld 124 (*) 70 - 99 (mg/dL)    BUN 14  6 - 23 (mg/dL)    Creatinine, Ser 4.40  0.50 - 1.10 (mg/dL)    Calcium 9.2  8.4 - 10.5 (mg/dL)    GFR calc non Af Amer >60  >60 (mL/min)    GFR calc Af Amer >60  >60 (mL/min)   MAGNESIUM     Status: Normal   Collection Time   01/03/11  4:11 AM      Component Value Range Comment   Magnesium 2.1  1.5 - 2.5 (mg/dL)   URINALYSIS, ROUTINE W REFLEX MICROSCOPIC     Status: Abnormal   Collection Time   01/03/11  5:25 AM      Component Value Range Comment   Color, Urine YELLOW  YELLOW     Appearance CLEAR  CLEAR     Specific Gravity, Urine 1.009  1.005 - 1.030     pH 7.5  5.0 - 8.0     Glucose, UA NEGATIVE  NEGATIVE (mg/dL)    Hgb urine dipstick NEGATIVE  NEGATIVE     Bilirubin Urine NEGATIVE  NEGATIVE      Ketones, ur NEGATIVE  NEGATIVE (mg/dL)    Protein, ur NEGATIVE  NEGATIVE (mg/dL)    Urobilinogen, UA 0.2  0.0 - 1.0 (mg/dL)    Nitrite NEGATIVE  NEGATIVE     Leukocytes, UA TRACE (*) NEGATIVE    URINE MICROSCOPIC-ADD ON     Status: Normal   Collection Time   01/03/11  5:25 AM      Component Value Range Comment   Squamous Epithelial / LPF RARE  RARE     WBC, UA 3-6  <3 (WBC/hpf)    RBC / HPF 0-2  <3 (RBC/hpf)    Bacteria, UA RARE  RARE     Ct Head Wo Contrast  01/03/2011  *RADIOLOGY REPORT*  Clinical Data: Weakness and headache.  CT HEAD WITHOUT CONTRAST  Technique:  Contiguous axial images were obtained from the base of the skull through the vertex without contrast.  Comparison: 06/14/2009  Findings: Moderate diffuse cerebral and cerebellar atrophy. Low attenuation changes in the deep white matter consistent with small vessel ischemic changes. The ventricles and sulci are otherwise symmetrical without significant effacement, displacement, or dilatation. No mass effect or midline shift. No abnormal extra- axial fluid collections. The grey-white matter junction is distinct. Basal cisterns are not effaced. No acute intracranial hemorrhage. No depressed skull fractures.  Visualized paranasal sinuses are not opacified.  Vascular calcifications.  No significant changes since the previous study.  IMPRESSION: Diffuse atrophy and small vessel ischemic changes.  No evidence of acute intracranial hemorrhage, mass lesion, or acute infarct.  Original Report Authenticated By: Marlon Pel, M.D.    Review of Systems  Constitutional: Negative for fever.  HENT: Negative for sore throat.   Eyes: Negative for blurred  vision, double vision, pain and redness.  Respiratory: Negative for cough and wheezing.   Cardiovascular: Negative for chest pain and palpitations.  Gastrointestinal: Negative for nausea, vomiting and abdominal pain.  Genitourinary: Negative for dysuria and frequency.  Musculoskeletal:  Negative for back pain and falls.  Skin: Positive for rash.  Neurological: Positive for weakness. Negative for dizziness, focal weakness and headaches.  Endo/Heme/Allergies: Does not bruise/bleed easily.  Psychiatric/Behavioral: Negative for memory loss.    There were no vitals taken for this visit. Physical Exam  Constitutional: She is oriented to person, place, and time. She appears well-developed and well-nourished. No distress.  HENT:  Head: Normocephalic.  Eyes: Conjunctivae and EOM are normal. Pupils are equal, round, and reactive to light. Right eye exhibits no discharge. Left eye exhibits no discharge. No scleral icterus.  Neck: Normal range of motion. Neck supple. No JVD present. No tracheal deviation present. No thyromegaly present.  Cardiovascular: Normal rate, regular rhythm, normal heart sounds and intact distal pulses.  Exam reveals no gallop and no friction rub.   No murmur heard. Respiratory: Breath sounds normal.  GI: Soft. Bowel sounds are normal. She exhibits no mass. There is no tenderness.  Musculoskeletal: Normal range of motion. She exhibits edema.       Trace.   Lymphadenopathy:    She has no cervical adenopathy.  Neurological: She is alert and oriented to person, place, and time. She has normal strength. No cranial nerve deficit or sensory deficit. GCS eye subscore is 4. GCS verbal subscore is 5. GCS motor subscore is 6.  Skin:        Assessment/Plan 77 year old female presents with generalized weakness and eye irritation concerning for TIA: 1) Will admit to [facility]PTS for TIA work up and observation.  Will obtain MRI brain, EKG and carotid US.  Will risk stratify with Hg A1C and fasting lipid panel. Patient has Asprin allergy.   2) Urticaria- will continue home Prednisone and Doxepin for hives control 3) FEN/GI- patient with completely normal neuro exam, no stroke swallow screen necessary.  Will Hep lock IV and give heart healthy diet.  Continue home Ranitidine.      4) DVT prophylaxis: Heparin 5000 U SQ TID 5) Disposition- pending TIA work up.   CHAMBERLAIN,RACHEL 01/03/2011, 8:45 AM

## 2011-01-04 LAB — LIPID PANEL
Cholesterol: 183 mg/dL (ref 0–200)
HDL: 68 mg/dL (ref >39)
LDL Cholesterol: 102 mg/dL — ABNORMAL HIGH (ref 0–99)
Total CHOL/HDL Ratio: 2.7 RATIO
Triglycerides: 63 mg/dL (ref <150)
VLDL: 13 mg/dL (ref 0–40)

## 2011-01-12 ENCOUNTER — Encounter: Payer: Self-pay | Admitting: Family Medicine

## 2011-01-12 ENCOUNTER — Ambulatory Visit (INDEPENDENT_AMBULATORY_CARE_PROVIDER_SITE_OTHER): Payer: Medicare Other | Admitting: Family Medicine

## 2011-01-12 VITALS — BP 122/72 | Temp 97.9°F | Ht 66.0 in | Wt 152.0 lb

## 2011-01-12 DIAGNOSIS — T783XXA Angioneurotic edema, initial encounter: Secondary | ICD-10-CM

## 2011-01-12 DIAGNOSIS — Z23 Encounter for immunization: Secondary | ICD-10-CM

## 2011-01-12 NOTE — Patient Instructions (Signed)
Come back and see me as needed.

## 2011-01-12 NOTE — Progress Notes (Signed)
  Subjective:    Patient ID: Chloe Lopez, female    DOB: 12-Feb-1924, 77 y.o.   MRN: [id number]  HPI 1. Flu Vaccine Administered. Did not want the zostavax because of potential side effects. Advised patient.  2. Angioedema Patient has a history of angioedema, there is no known cause. She is seen by a rheumatologist. She has a small upper lip swelling that is consistent with her pattern of angioedema. She takes prednisone 5 mg qd, and 2 tabs when flares occur. No fever, no throat swelling, no sob, no rapid breathing, no difficulty swallowing.   Review of Systems As above or negative/non-pertinent    Objective:   Physical Exam Lungs: cta b/l Cardiac: RRR, no murmur HEENT: upper lip swelling, tongue normal, no stridor, no bruit.     Assessment & Plan:  1. Flu vaccine 2. Angioedema - continue current regimen

## 2011-01-22 ENCOUNTER — Other Ambulatory Visit (INDEPENDENT_AMBULATORY_CARE_PROVIDER_SITE_OTHER): Payer: Self-pay | Admitting: General Surgery

## 2011-01-22 DIAGNOSIS — Z853 Personal history of malignant neoplasm of breast: Secondary | ICD-10-CM

## 2011-01-27 NOTE — Discharge Summary (Signed)
NAMESALIMA, Lopez NO.:  [id number]  MEDICAL RECORD NO.:  [id number]  LOCATION:  [facility]                         FACILITY:  [facility]  PHYSICIAN:  Pearlean Brownie, M.D.DATE OF BIRTH:  November 30, 1923  DATE OF ADMISSION:  01/03/2011 DATE OF DISCHARGE:  01/04/2011                              DISCHARGE SUMMARY   PRIMARY CARE PROVIDER:  Edd Arbour, MD  DISCHARGE DIAGNOSIS:  Negative transient ischemic attack workup.  DISCHARGE MEDICATIONS:  Continue home medications: 1. Calcium plus vitamin D. 2. Doxepin 30 mg p.o. q.p.m. 3. Multivitamin. 4. Prednisone 5-10 mg p.o. q.a.m. depending on hives. 5. Ranitidine 300 mg p.o. b.i.d.  CONSULTATIONS:  None.  PROCEDURES: 1. CT of the head negative. 2. MRI showed no acute or focal disease. 3. 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  PERTINENT LABS:  Cardiac enzymes negative x1.  BRIEF HOSPITAL COURSE:  This is an 77 year old female with a history of chronic urticaria on chronic prednisone and a history of breast cancer in remission who presents with generalized weakness and left eye irritation x1 day.  The patient was admitted for a TIA rule out. 1. Generalized weakness and left eye irritation concerning for TIA.     The patient did not present with any focal findings.  The patient     was admitted for a TIA workup.  Her CT of the head, MRI, and MRA     were negative for stroke.  The patient presented with generalized     weakness and left conjunctival irritation without any physical     exam findings.  Her symptoms were fully resolved by hospital day     #2.  It is unclear what caused her symptoms.  The patient's     symptoms had fully resolved on the day of discharge and she felt     back to her baseline.  The patient had no neurological findings and   had a stable steady gait.  No changes to her medications were made     as this was thought unlikely to be a TIA. 2. 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  These were     thought to be an unlikely cause for her symptoms, especially since     her symptoms had fully resolved by the day of discharge. 3. History of chronic urticaria.  The patient was continued on her     home doxepin and prednisone.  FOLLOWUP APPOINTMENTS:  The patient was asked to follow up with her primary care provider, Dr. Rivka Safer at [facility]. As the patient was discharged over the weekend, the patient was asked to call on the following visit and make an appointment for 1-2 weeks for hospital followup.  DISCHARGE CONDITION:  The patient was discharged home in stable medical condition.    ______________________________ Priscella Mann, MD   ______________________________ Pearlean Brownie, M.D.    AO/MEDQ  D:  01/04/2011  T:  01/04/2011  Job:  [id number]  cc:   Edd Arbour, MD  Electronically Signed by Priscella Mann MD on 01/15/2011 11:09:20 AM Electronically Signed by Pearlean Brownie M.D. on 01/27/2011 11:08:42 AM

## 2011-01-27 NOTE — H&P (Signed)
NAMEANGELLEE, COHILL NO.:  [id number]  MEDICAL RECORD NO.:  [id number]  LOCATION:  [facility]                         FACILITY:  [facility]  PHYSICIAN:  Pearlean Brownie, M.D.DATE OF BIRTH:  10/13/1923  DATE OF ADMISSION:  01/03/2011 DATE OF DISCHARGE:                             HISTORY & PHYSICAL   CHIEF COMPLAINT:  Weakness and left eye irritation.  HISTORY OF PRESENT ILLNESS:  Ms. Snare is an 77 year old lady who presented to the emergency room for several hours of left eye irritation and generalized weakness.  She reports she was at her daughter's for dinner last night and during dinner, she felt like there is something in her eye.  She states that she did not have any vision affected by it, her eye was not red.  She also states that she felt some generalized weakness throughout her body.  She says her daughter had to help her to her car and drive her home and then later that evening, her daughter decided she should go to the ER, this is around midnight.  The patient says that now her weakness has improved and her eyes feels somewhat better, but it is still not feeling normal.  She says now her left eye feels like there is a film over top of her eye.  PAST MEDICAL HISTORY:  Significant for breast cancer 4 years ago, status post lumpectomy, in remission; osteopenia; and chronic urticaria and hives  PAST MEDICAL HISTORY:  Significant for lumpectomy in 2009, hysterectomy remote.  FAMILY HISTORY:  The patient's parents are deceased.  Family history is noncontributory.  SOCIAL HISTORY:  The patient reports that she quit smoking 15 years ago. She drinks alcohol about once a week.  She does not use illicit drug.  ALLERGIES:  Aspirin.  CURRENT MEDICATIONS: 1. Os-Cal with D 500 - 200 mg p.o. b.i.d. 2. Doxepin 25 mg p.o. daily. 3. Prednisone 5 mg p.o. daily. 4. Zantac 300 mg p.o. b.i.d.  REVIEW OF SYSTEMS:  GENERAL:  Negative for fever.  HEENT:  Negative  for sore throat.  Negative for blurred vision, double vision, pain, or redness.  RESPIRATORY:  Negative for cough or wheezing.  CARDIOVASCULAR: Negative for pain or palpitations.  GI:  Negative for nausea, vomiting, or abdominal pain.  GENITOURINARY:  Negative for dysuria or frequency. MUSCULOSKELETAL: Negative for pain.  Negative for falls.  SKIN: Positive for rash.  NEUROLOGIC:  Positive for generalized weakness. Negative for focal weakness.  Negative for headaches.  Negative for dizziness.  Negative for easy bruising.  Negative for any memory loss or confusion.  PHYSICAL EXAMINATION:  VITAL SIGNS:  Temperature 98.1, pulse 67, respiratory rate 16, blood pressure 132/76, pulse ox 95% on room air. CONSTITUTIONAL:  The patient is alert and oriented to person, place and time. GENERAL:  She is well developed, well nourished, in no distress. HEAD:  Normocephalic. EYES:  Conjunctivae and extraocular movements normal.  Pupils are equal, round, reactive to light.  Funduscopic exam within normal limits.  There is no eye discharge.  There is no scleral icterus.  No erythema. NECK:  Normal range of motion, supple.  No JVD.  No thyromegaly. CARDIOVASCULAR:  Regular rate  and rhythm.  Normal heart sounds intact. Intact distal pulses. RESPIRATORY:  Breath sounds are normal.  Normal work of breathing. GI:  Abdomen is soft and nontender.  Bowel sounds are normal.  No masses. MUSCULOSKELETAL:  The patient has normal range of motion in all extremities.  There is some trace edema in her lower legs. LYMPHADENOPATHY:  Negative. NEUROLOGIC:  The patient is alert and oriented x3.  Her strength and sensation are normal.  Cranial nerves are normal.  GCS is 15. SKIN:  The patient does have a large hive on her left lower back.  LABS AND STUDIES:  CBC with differential:  White blood cell count 8.3, hemoglobin 14.0, hematocrit 41.9, platelets 288, neutrophils 78%, lymphocytes 18%, and monocytes 4%.  Basic  metabolic panel sodium 142, potassium 3.8, chloride 108, CO2 of 25, glucose 124, BUN 14, creatinine 0.60, calcium 9.2, and magnesium 2.1.  Urinalysis was positive for trace leukocyte esterase.  Microscopic showed rare epithelial cells, 36 white blood cells,  0-2 red blood cells, and rare bacteria.  CT of the head without contrast showed diffuse atrophy, small vessel ischemic changes. No evidence of acute intracranial hemorrhage, mass, lesion, or acute infarct.  ASSESSMENT/PLAN:  Mrs. Lourenco is an 77 year old female who presents with generalized weakness and eye irritation concerning for a transient ischemic attack. 1. TIA workup.  We will admit her for observation overnight.  We will     obtain MRI of the brain, EKG, and carotid ultrasound.  We will also     risk stratify by getting a hemoglobin A1c and fasting lipid panel.     The patient is allergic to aspirin, so we will not start this     medication. 2. Urticaria, fairly well controlled at this time.  We will continue     the patient's home prednisone and doxepin. 3. FEN, GI:  The patient has normal neuro exam, we will not need to do     a stroke-swallow screen.  We will Hep-Lock her IV and give her     heart-healthy diet.  We will continue her home ranitidine. 4. DVT prophylaxis.  We will give heparin 5000 units subcu t.i.d. 5. Disposition is pending TIA workup.    ______________________________ Ardyth Gal, MD   ______________________________ Pearlean Brownie, M.D.    CR/MEDQ  D:  01/03/2011  T:  01/03/2011  Job:  [id number]  Electronically Signed by Ardyth Gal MD on 01/19/2011 09:09:28 AM Electronically Signed by Pearlean Brownie M.D. on 01/27/2011 11:08:47 AM

## 2011-02-03 LAB — COMPREHENSIVE METABOLIC PANEL
ALT: 18
AST: 19
CO2: 27
Calcium: 9.7
Creatinine, Ser: 0.75
GFR calc Af Amer: >60
GFR calc non Af Amer: >60
Sodium: 141
Total Protein: 6.7

## 2011-02-03 LAB — URINALYSIS, ROUTINE W REFLEX MICROSCOPIC
Glucose, UA: NEGATIVE
Ketones, ur: NEGATIVE
Nitrite: NEGATIVE
Specific Gravity, Urine: 1.007
pH: 6.5

## 2011-02-03 LAB — DIFFERENTIAL
Eosinophils Absolute: 0.1
Eosinophils Relative: 1
Lymphocytes Relative: 25
Lymphs Abs: 2.4
Monocytes Relative: 6
Neutrophils Relative %: 68

## 2011-02-03 LAB — CBC
MCHC: 32.8
MCV: 97
RDW: 14.1

## 2011-02-03 LAB — LACTATE DEHYDROGENASE: LDH: 136

## 2011-03-02 ENCOUNTER — Encounter (INDEPENDENT_AMBULATORY_CARE_PROVIDER_SITE_OTHER): Payer: Self-pay | Admitting: General Surgery

## 2011-04-07 ENCOUNTER — Other Ambulatory Visit: Payer: Self-pay | Admitting: Allergy and Immunology

## 2011-04-15 ENCOUNTER — Other Ambulatory Visit: Payer: Medicare Other

## 2011-04-22 ENCOUNTER — Ambulatory Visit
Admission: RE | Admit: 2011-04-22 | Discharge: 2011-04-22 | Disposition: A | Discharge status: Home / Self Care | Payer: Medicare Other | Source: Ambulatory Visit | Attending: General Surgery | Admitting: General Surgery

## 2011-04-22 ENCOUNTER — Ambulatory Visit
Admission: RE | Admit: 2011-04-22 | Discharge: 2011-04-22 | Disposition: A | Discharge status: Home / Self Care | Payer: Medicare Other | Source: Ambulatory Visit | Attending: Allergy and Immunology | Admitting: Allergy and Immunology

## 2011-04-22 DIAGNOSIS — Z853 Personal history of malignant neoplasm of breast: Secondary | ICD-10-CM

## 2011-07-22 ENCOUNTER — Encounter: Payer: Self-pay | Admitting: Family Medicine

## 2011-07-22 ENCOUNTER — Ambulatory Visit (INDEPENDENT_AMBULATORY_CARE_PROVIDER_SITE_OTHER): Payer: Medicare Other | Admitting: Family Medicine

## 2011-07-22 VITALS — BP 126/78 | HR 82 | Temp 97.6°F | Ht 66.0 in | Wt 160.0 lb

## 2011-07-22 DIAGNOSIS — M25552 Pain in left hip: Secondary | ICD-10-CM | POA: Insufficient documentation

## 2011-07-22 DIAGNOSIS — M25559 Pain in unspecified hip: Secondary | ICD-10-CM

## 2011-07-22 HISTORY — DX: Pain in left hip: M25.552

## 2011-07-22 MED ORDER — TRAMADOL HCL 50 MG PO TABS: 50.0000 mg | ORAL_TABLET | Freq: Three times a day (TID) | ORAL | Status: DC | PRN

## 2011-07-22 NOTE — Progress Notes (Signed)
  Subjective:    Patient ID: Chloe Lopez, female    DOB: Sep 22, 1923, 78 y.o.   MRN: [id number]  HPI 1 month of left "hip" pain  Non traumatic, no change in physical activities.  Located "deep in hip" pointing in lower left gluteal muscle.  Not painful when she gardens or walks.  Feels worst at night when she lays down.  Improved with some leftover tramadol she had.    No radiation, no back pain.  Denies fever, chills, weight loss.  No change in bowel or bladder habits.  I have reviewed patient's  PMH, FH, and Social history and Medications as related to this visit. Sig for osteopenia on chronic steroids.   Review of Systems See hpi    Objective:   Physical Exam GEN: Alert & Oriented, No acute distress, appears younger than her age. CV:  Regular Rate & Rhythm, no murmur Respiratory:  Normal work of breathing, CTAB Bilateral Extr: negative straight leg raise, FABER does not elicit hip pain, some decreased ROM not surprising given age.  No pain on palpation of back with good flexion/extension and lateral rotation.  No pain over trochanteric bursitis.  States pain is localized over left ischial tuberosity- but not very painful when I palpate it.          Assessment & Plan:

## 2011-07-22 NOTE — Progress Notes (Signed)
  Subjective:    Patient ID: Chloe Lopez, female    DOB: 11/10/1923, 78 y.o.   MRN: [id number]  HPI Chloe Lopez is a 78 yo F with PMHx of chronic urticaria and osteopenia who presents with one month of left posterior hip pain.  Pt describes the pain as dull and intermittent.  She says that it hurts the most when she lies down at night.  Denies radiation of pain, numbness, tingling, or weakness in left leg.  She denies recent illness or fever and says that she has never had pain like this in the past.  She says that she has been gardening a lot recently and denies any falls in the past year.  Pt reports that she has taken tramadol a few times which helps with pain.  Denies lower back pain, loss of bowel/bladder function, new rash.   Review of Systems  All other systems reviewed and are negative.      Objective:   Physical Exam  Constitutional: She appears well-developed and well-nourished. No distress.  Cardiovascular: Normal rate, regular rhythm and normal heart sounds.  Exam reveals no gallop and no friction rub.   No murmur heard. Pulmonary/Chest: Breath sounds normal. No respiratory distress. She has no wheezes. She has no rales.  Musculoskeletal:       Lumbar back: She exhibits normal range of motion, no tenderness, no swelling, no deformity and no pain.       Lower extremities: no atrophy or tenderness, strength 5/5 bilaterally, full ROM not restricted by pain or tightness, sensation intact; straight leg test negative, FABERE test negative  Neurological:       Gait normal.  Skin: Skin is warm and dry.    Blood pressure 126/78, pulse 82, temperature 97.6 F (36.4 C), temperature source Oral, height 5\' 6"  (1.676 m), weight 160 lb (72.576 kg).      Assessment & Plan:

## 2011-07-22 NOTE — Patient Instructions (Addendum)
Thank you for visiting [place] today. Please get an x-ray of your hip to evaluate for any bone disease. Please schedule a follow-up appointment with [place] in 1 week to review x-ray findings and recheck your symptoms.

## 2011-07-22 NOTE — Assessment & Plan Note (Addendum)
Most likely due to mild ischial bursitis, exam does not suggest neuropathic type pain or referred pain from hip or back.  Will get xray of bilateral pelvis/hip and lumbar spines given history of cancer and osteopenia on chronic steroids.  Tramadol for symptomatic relief.  Will call her with results

## 2011-07-27 ENCOUNTER — Ambulatory Visit
Admission: RE | Admit: 2011-07-27 | Discharge: 2011-07-27 | Disposition: A | Discharge status: Home / Self Care | Payer: Medicare Other | Source: Ambulatory Visit | Attending: Family Medicine | Admitting: Family Medicine

## 2011-07-27 ENCOUNTER — Telehealth: Payer: Self-pay | Admitting: Family Medicine

## 2011-07-27 DIAGNOSIS — M25552 Pain in left hip: Secondary | ICD-10-CM

## 2011-07-27 DIAGNOSIS — M899 Disorder of bone, unspecified: Secondary | ICD-10-CM

## 2011-07-27 NOTE — Assessment & Plan Note (Addendum)
Hip pain - has only taken 3 tramadol since seen 5 days ago.  Pain mild, only at night, not with sitting.  Well controlled with tramadol.  Not sure if it correlates with days of increased activity.  Since mild and intermittent, continue tx with tramadol  Advised if worsens, will refer to sports med for further eval, possible injection.

## 2011-07-27 NOTE — Telephone Encounter (Signed)
Discussed xray results with patient:  Hip pain - has only taken 3 tramadol since seen 5 days ago.  Pain mild, only at night, not with sitting.  Well controlled with tramadol.  Not sure if it correlates with days of increased activity.  Since mild and intermittent, continue tx with tramadol  Advised if worsens, will refer to sports med for further eval, possible injection.  Osteoporosis discuss xray showing remote fracture no history of back pain.  Advised may discuss with PCP at upcomming appt if tx with bisphosphonate is right for her.

## 2011-07-27 NOTE — Assessment & Plan Note (Signed)
FRAX score gives her dx of osteoporosis and now with compression fx seen on xray would be candidate for bisphosphonate tx (has never been on rx therapy) - patient will discuss with her PCP options in more detail at their upcoming appt.

## 2011-08-07 ENCOUNTER — Ambulatory Visit: Payer: Medicare Other | Admitting: Family Medicine

## 2011-08-13 ENCOUNTER — Other Ambulatory Visit: Payer: Self-pay | Admitting: Family Medicine

## 2011-08-21 ENCOUNTER — Encounter: Payer: Self-pay | Admitting: Family Medicine

## 2011-08-21 ENCOUNTER — Ambulatory Visit (INDEPENDENT_AMBULATORY_CARE_PROVIDER_SITE_OTHER): Payer: Medicare Other | Admitting: Family Medicine

## 2011-08-21 VITALS — BP 125/77 | HR 59 | Temp 97.9°F | Ht 66.0 in | Wt 161.3 lb

## 2011-08-21 DIAGNOSIS — M5430 Sciatica, unspecified side: Secondary | ICD-10-CM

## 2011-08-21 HISTORY — DX: Sciatica, unspecified side: M54.30

## 2011-08-21 MED ORDER — TRAMADOL HCL 50 MG PO TABS: 50.0000 mg | ORAL_TABLET | Freq: Four times a day (QID) | ORAL | Status: DC | PRN

## 2011-08-21 NOTE — Assessment & Plan Note (Signed)
Will continue tramadol for this pain.  Will hold off on MRI for now, because patient wants to wait and she doesn't feel it is severe enough.

## 2011-08-21 NOTE — Patient Instructions (Signed)
It was great to see you today!  Schedule an appointment to see me as needed.  Meds ordered this encounter  Medications  . traMADol (ULTRAM) 50 MG tablet    Sig: Take 1 tablet (50 mg total) by mouth every 6 (six) hours as needed for pain.    Dispense:  60 tablet    Refill:  5      If this gets worse we can MRI your lower back or hip.

## 2011-08-21 NOTE — Progress Notes (Signed)
  Subjective:    Patient ID: Chloe Lopez, female    DOB: Jul 15, 1923, 78 y.o.   MRN: [id number]  HPI 1. Shooting pain down left leg Patient has been evaluated by Dr. Earnest Bailey for left hip pain. "Most likely due to mild ischial bursitis, exam does not suggest neuropathic type pain or referred pain from hip or back." Xrays were normal. She describes shooting pain down her left leg now that occurs about three times per week. The pain can be severe 8/10.  She describes it as muscular. Her previous hip pain improved with tramadol.   Tobacco use: Patient is a non smoker.  Review of Systems Pertinent items are noted in HPI.     Objective:   Physical Exam Filed Vitals:   08/21/11 1408  BP: 125/77  Pulse: 59  Temp: 97.9 F (36.6 C)  TempSrc: Oral  Height: 5\' 6"  (1.676 m)  Weight: 161 lb 4.8 oz (73.165 kg)  Gen: Lungs:  Normal respiratory effort, chest expands symmetrically. Lungs are clear to auscultation, no crackles or wheezes. Heart - Regular rate and rhythm.  No murmurs, gallops or rubs.     Left leg/Hip: Straight leg negative, FROM/passive and active. No TTP. Hip non-ttp. No swelling of leg or hip. No skin changes.  Bent over test had no lumbar pain. Lean back negative.     Assessment & Plan:

## 2011-11-23 DIAGNOSIS — H3530 Unspecified macular degeneration: Secondary | ICD-10-CM | POA: Insufficient documentation

## 2011-11-23 DIAGNOSIS — Z961 Presence of intraocular lens: Secondary | ICD-10-CM | POA: Insufficient documentation

## 2011-11-26 ENCOUNTER — Telehealth (INDEPENDENT_AMBULATORY_CARE_PROVIDER_SITE_OTHER): Payer: Self-pay | Admitting: General Surgery

## 2011-11-26 ENCOUNTER — Ambulatory Visit (INDEPENDENT_AMBULATORY_CARE_PROVIDER_SITE_OTHER): Payer: Self-pay | Admitting: General Surgery

## 2011-11-26 NOTE — Telephone Encounter (Signed)
Called patient to inquire why appointment for 11/26/11 was cancelled. She stated she could not believe her daughter cancelled the appointment and did not understand why. She said she will make sure to be available for the next appointment.  As of this message being entered, there were no arrangements made by the patient's daughter to be reevaluated. Patient had appointment on 12/16/11 and it was cancelled because she wanted to be seen sooner.

## 2011-12-16 ENCOUNTER — Ambulatory Visit (INDEPENDENT_AMBULATORY_CARE_PROVIDER_SITE_OTHER): Payer: Self-pay | Admitting: General Surgery

## 2011-12-23 ENCOUNTER — Telehealth (INDEPENDENT_AMBULATORY_CARE_PROVIDER_SITE_OTHER): Payer: Self-pay | Admitting: General Surgery

## 2011-12-23 NOTE — Telephone Encounter (Signed)
Called patient to confirm mammogram was performed. She stated it was at the breast center, has a copy of the report and will bring it with her to the appointment.

## 2011-12-24 ENCOUNTER — Ambulatory Visit (INDEPENDENT_AMBULATORY_CARE_PROVIDER_SITE_OTHER): Payer: Medicare Other | Admitting: General Surgery

## 2011-12-24 ENCOUNTER — Encounter (INDEPENDENT_AMBULATORY_CARE_PROVIDER_SITE_OTHER): Payer: Self-pay | Admitting: General Surgery

## 2011-12-24 VITALS — BP 110/79 | HR 64 | Temp 97.4°F | Ht 66.0 in | Wt 162.0 lb

## 2011-12-24 DIAGNOSIS — Z853 Personal history of malignant neoplasm of breast: Secondary | ICD-10-CM

## 2011-12-24 NOTE — Patient Instructions (Signed)
Your breast exam today is normal, and there is no evidence of cancer on physical exam. Your mammograms were performed in December of 2012, and they show no abnormality. There is no evidence of cancer at this time.  Be sure to get mammograms in December of this year.  Return to see Dr. Derrell Lolling in one year.

## 2011-12-24 NOTE — Progress Notes (Signed)
Patient ID: Chloe Lopez, female   DOB: 16-Sep-1923, 78 y.o.   MRN: [id number]  Chief Complaint  Patient presents with  . Follow-up    reck br    HPI Chloe Lopez is a 78 y.o. female.  She returns for long-term followup regarding her left breast cancer.  This patient underwent left partial mastectomy and axillary lymph node dissection on 03/13/2008. She had invasive mammary carcinoma, pathologic stage T1b., N0, receptor positive, HER-2-negative, Ki-67 7%.  She was followed by Dr. Pierce Crane for some time but is now not being followed. I do not think that she had radiation therapy. I think that she declined antiestrogen therapy. Her last office visit with me was 03/06/2010. Her last mammogram was in December 2012 which was category 2, no focal abnormality.  She states that she has stopped driving her car she did not want to buy a new car. She states that she is considering moving to live with one of her children because of her advanced age.  She has no specific complaints, although she does report   a mild dull ache in the left anterior chest wall. This does not occur every day and  is not severe. HPI  Past Medical History  Diagnosis Date  . Depression   . Sciatica 08/21/2011    Started recently in left hip. Takes tramadol. Does not want surgery. Happens once a week or so.      Past Surgical History  Procedure Date  . Mastectomy, partial 2009    History reviewed. No pertinent family history.  Social History History  Substance Use Topics  . Smoking status: Former Smoker    Quit date: 12/15/1995  . Smokeless tobacco: Never Used  . Alcohol Use: 1.0 oz/week    2 drink(s) per week    Allergies  Allergen Reactions  . Aspirin     Current Outpatient Prescriptions  Medication Sig Dispense Refill  . calcium-vitamin D (OSCAL WITH D) 500-200 MG-UNIT per tablet Take 2 tablets by mouth 2 (two) times daily.      Marland Kitchen doxepin (SINEQUAN) 25 MG capsule Take 25 mg by mouth daily.          . predniSONE (DELTASONE) 5 MG tablet Take 5 mg by mouth.        . ranitidine (ZANTAC) 300 MG tablet Take 300 mg by mouth 2 (two) times daily.        . traMADol (ULTRAM) 50 MG tablet Take 1 tablet (50 mg total) by mouth every 6 (six) hours as needed for pain.  60 tablet  5    Review of Systems Review of Systems  Constitutional: Negative for fever, chills and unexpected weight change.  HENT: Negative for hearing loss, congestion, sore throat, trouble swallowing and voice change.   Eyes: Negative for visual disturbance.  Respiratory: Negative for cough and wheezing.   Cardiovascular: Negative for chest pain, palpitations and leg swelling.  Gastrointestinal: Negative for nausea, vomiting, abdominal pain, diarrhea, constipation, blood in stool, abdominal distention and anal bleeding.  Genitourinary: Negative for hematuria, vaginal bleeding and difficulty urinating.  Musculoskeletal: Negative for arthralgias.  Skin: Negative for rash and wound.  Neurological: Negative for seizures, syncope and headaches.  Hematological: Negative for adenopathy. Does not bruise/bleed easily.  Psychiatric/Behavioral: Negative for confusion.    Blood pressure 110/79, pulse 64, temperature 97.4 F (36.3 C), temperature source Temporal, height 5\' 6"  (1.676 m), weight 162 lb (73.483 kg), SpO2 95.00%.  Physical Exam Physical Exam  Constitutional: She is  oriented to person, place, and time. She appears well-developed and well-nourished. No distress.  HENT:  Head: Normocephalic and atraumatic.  Nose: Nose normal.  Mouth/Throat: No oropharyngeal exudate.  Neck: Neck supple. No JVD present. No tracheal deviation present. No thyromegaly present.  Cardiovascular: Normal rate, regular rhythm, normal heart sounds and intact distal pulses.   No murmur heard. Pulmonary/Chest: Effort normal and breath sounds normal. No respiratory distress. She has no wheezes. She has no rales. She exhibits no tenderness.          Well-healed scar left breast upper inner quadrant and left axilla. No palpable mass in either breast. No other skin change. No axillary adenopathy. Breasts atrophic.  Abdominal: Soft. Bowel sounds are normal. She exhibits no distension and no mass. There is no tenderness. There is no rebound and no guarding.  Musculoskeletal: She exhibits no edema and no tenderness.  Lymphadenopathy:    She has no cervical adenopathy.  Neurological: She is alert and oriented to person, place, and time. She exhibits normal muscle tone. Coordination normal.  Skin: Skin is warm. No rash noted. She is not diaphoretic. No erythema. No pallor.  Psychiatric: She has a normal mood and affect. Her behavior is normal. Judgment and thought content normal.    Data Reviewed Mammograms. Notes from [facility].  Assessment    Invasive mammary carcinoma left breast, upper inner quadrant, pathologic stage T1b., N0, receptor positive, HER-2-negative, Ki 67 7%.  No evidence of recurrence almost 4 years following left partial mastectomy and lymph node dissection    Plan    She was reassured.  Repeat mammograms December 2013  Return to see me in one year.       [name] M 12/24/2011, 10:38 AM

## 2011-12-28 ENCOUNTER — Encounter: Payer: Self-pay | Admitting: Home Health Services

## 2012-01-19 ENCOUNTER — Telehealth: Payer: Self-pay | Admitting: Family Medicine

## 2012-01-19 NOTE — Telephone Encounter (Signed)
Called pt back, informed her that Medicare only pays for 1 type of appointment per day and that she would need to schedule an appointment for her sAWV for another day.  Pt will schedule once she secures transportation.

## 2012-01-19 NOTE — Telephone Encounter (Signed)
Ms. Shadowens have an appt tomorrow with Dr. Earnest Bailey at 9:45 and would like to have one with you tomorrow as well.  Please call her back asap.

## 2012-01-21 ENCOUNTER — Encounter: Payer: Self-pay | Admitting: Family Medicine

## 2012-01-21 ENCOUNTER — Ambulatory Visit (INDEPENDENT_AMBULATORY_CARE_PROVIDER_SITE_OTHER): Payer: Medicare Other | Admitting: Family Medicine

## 2012-01-21 ENCOUNTER — Ambulatory Visit: Payer: Medicare Other | Admitting: Family Medicine

## 2012-01-21 VITALS — BP 145/76 | HR 58 | Temp 98.1°F | Ht 66.0 in | Wt 162.0 lb

## 2012-01-21 DIAGNOSIS — M949 Disorder of cartilage, unspecified: Secondary | ICD-10-CM

## 2012-01-21 DIAGNOSIS — L509 Urticaria, unspecified: Secondary | ICD-10-CM

## 2012-01-21 DIAGNOSIS — IMO0001 Reserved for inherently not codable concepts without codable children: Secondary | ICD-10-CM

## 2012-01-21 DIAGNOSIS — M25552 Pain in left hip: Secondary | ICD-10-CM

## 2012-01-21 DIAGNOSIS — M25559 Pain in unspecified hip: Secondary | ICD-10-CM

## 2012-01-21 DIAGNOSIS — Z853 Personal history of malignant neoplasm of breast: Secondary | ICD-10-CM

## 2012-01-21 DIAGNOSIS — R61 Generalized hyperhidrosis: Secondary | ICD-10-CM

## 2012-01-21 DIAGNOSIS — M5430 Sciatica, unspecified side: Secondary | ICD-10-CM

## 2012-01-21 DIAGNOSIS — Z23 Encounter for immunization: Secondary | ICD-10-CM

## 2012-01-21 DIAGNOSIS — M899 Disorder of bone, unspecified: Secondary | ICD-10-CM

## 2012-01-21 HISTORY — DX: Reserved for inherently not codable concepts without codable children: IMO0001

## 2012-01-21 LAB — CBC WITH DIFFERENTIAL/PLATELET
Basophils Absolute: 0 10*3/uL (ref 0.0–0.1)
Lymphocytes Relative: 19 % (ref 12–46)
Neutro Abs: 6 10*3/uL (ref 1.7–7.7)
Neutrophils Relative %: 75 % (ref 43–77)
Platelets: 354 10*3/uL (ref 150–400)
RDW: 13.9 % (ref 11.5–15.5)
WBC: 8 10*3/uL (ref 4.0–10.5)

## 2012-01-21 LAB — COMPREHENSIVE METABOLIC PANEL
ALT: 13 U/L (ref 0–35)
AST: 14 U/L (ref 0–37)
Albumin: 4.1 g/dL (ref 3.5–5.2)
Calcium: 9.8 mg/dL (ref 8.4–10.5)
Chloride: 104 mEq/L (ref 96–112)
Creat: 0.8 mg/dL (ref 0.50–1.10)
Potassium: 4.5 mEq/L (ref 3.5–5.3)

## 2012-01-21 MED ORDER — TRAMADOL HCL 50 MG PO TABS: 50.0000 mg | ORAL_TABLET | Freq: Four times a day (QID) | ORAL | Status: DC | PRN

## 2012-01-21 NOTE — Assessment & Plan Note (Signed)
No red flags for malignancy.  Does not affect quality of life.  WIll check TSH, CBC, CMET.

## 2012-01-21 NOTE — Progress Notes (Signed)
  Subjective:    Patient ID: Chloe Lopez, female    DOB: 1923/09/21, 78 y.o.   MRN: [id number]  HPI Here for follow-up chronic medical conditions  Episodes of sweating during the day. No night sweats, no weight loss.  Does not bother her.    Hip pain: has been taking tramadol previously prescribed by orthopedist and then later by previous PCP.  Well controlled with occ tramadl  Hives:  Feels that after testing herself- that tramadol improved her chronic hives.  Her allergist did not have any comment on this.  Not havign any sleepiness or constipation.  Osteopenia:  Not taking calcium supplement- states she is able to get enough by nutrition  I have reviewed patient's  PMH, FH, and Social history and Medications as related to this visit.   Review of Systemssee hPI     Objective:   Physical Exam GEN: Alert & Oriented, No acute distress CV:  Regular Rate & Rhythm, no murmur Respiratory:  Normal work of breathing, CTAB Abd:  + BS, soft, no tenderness to palpation Ext: no pre-tibial edema        Assessment & Plan:  Flu shot given today

## 2012-01-21 NOTE — Assessment & Plan Note (Signed)
She feels she can keep up with calcium needs with diet alone.  Will check Vitamin D today.

## 2012-01-21 NOTE — Assessment & Plan Note (Signed)
Manageable with surgery,  Not worsenign or imparigin quality of life

## 2012-01-21 NOTE — Assessment & Plan Note (Signed)
resolved 

## 2012-01-21 NOTE — Patient Instructions (Addendum)
I refilled your tramadol  Will check your thyroid, electrolytes, and anemia.  Will send you a letter with normal labs  Get mammogram in December as recommended by your surgeon  Follow-up yearly

## 2012-01-22 ENCOUNTER — Encounter: Payer: Self-pay | Admitting: Family Medicine

## 2012-01-22 LAB — TSH: TSH: 1.984 u[IU]/mL (ref 0.350–4.500)

## 2012-03-25 ENCOUNTER — Ambulatory Visit (INDEPENDENT_AMBULATORY_CARE_PROVIDER_SITE_OTHER): Payer: Medicare Other | Admitting: Cardiovascular Disease

## 2012-03-25 ENCOUNTER — Encounter: Payer: Self-pay | Admitting: Cardiovascular Disease

## 2012-03-25 VITALS — BP 136/75 | HR 68 | Ht 67.2 in | Wt 161.0 lb

## 2012-03-25 DIAGNOSIS — R0602 Shortness of breath: Secondary | ICD-10-CM

## 2012-03-25 DIAGNOSIS — R55 Syncope and collapse: Secondary | ICD-10-CM

## 2012-03-25 NOTE — Progress Notes (Signed)
HPI:  78 year old woman presenting for initial cardiac evaluation. The patient is self-referred because of concerns about progressive episodes of weakness. She has been highly functional and healthy over the years. She enjoys gardening and reading. She lives alone in [place] and has a daughter who lives nearby. Over the past 3-4 months she notes more frequent episodes of generalized weakness. When these occur she feels like she is unable to stand up because of lightheadedness and near syncope. She has not had frank syncope. She does not have associated symptoms of palpitations. She has not had exertional chest pain or pressure. Her daughter notes that the patient's breathing has been more labored with routine activities such as going to the grocery store. Patient also feels like her hand strength is more week. She has some limitation from left knee pain. Her appetite has been poor. She admits to night sweats. She denies edema, orthopnea, or PND. She did have some leg swelling when she went to visit family in [place], but this resolved as soon as she returned home.  Outpatient Encounter Prescriptions as of 03/25/2012  Medication Sig Dispense Refill  . calcium-vitamin D (OSCAL WITH D) 500-200 MG-UNIT per tablet Take 2 tablets by mouth 2 (two) times daily. Pt takes occasionally      . DOXEPIN HCL PO Take by mouth. 30 mg  Twice a day      . Multiple Vitamin (MULTIVITAMIN) tablet Take 1 tablet by mouth daily. Pt takes every so often      . predniSONE (DELTASONE) 5 MG tablet Per allergist for angioedema-- pt takes 7 .5 mg daily      . Ranitidine HCl (ZANTAC PO) Take by mouth. 300mg  tabs twice a day      . traMADol (ULTRAM) 50 MG tablet Take 1 tablet (50 mg total) by mouth every 6 (six) hours as needed for pain.  30 tablet  5  . [DISCONTINUED] calcium-vitamin D (OSCAL WITH D) 500-200 MG-UNIT per tablet Take 2 tablets by mouth 2 (two) times daily.        Aspirin  Past Medical History  Diagnosis Date    . Depression   . Sciatica 08/21/2011    Started recently in left hip. Takes tramadol. Does not want surgery. Happens once a week or so.      Past Surgical History  Procedure Date  . Mastectomy, partial 2009    History   Social History  . Marital Status: Single    Spouse Name: N/A    Number of Children: 3  . Years of Education: college   Occupational History  . RETIRED     histologist   Social History Main Topics  . Smoking status: Former Smoker    Quit date: 12/15/1995  . Smokeless tobacco: Never Used  . Alcohol Use: 1.0 oz/week    2 drink(s) per week  . Drug Use: No  . Sexually Active: Not on file   Other Topics Concern  . Not on file   Social History Narrative   Health Care POA: Emergency Contact: daughter, Lottie Mussel (c) [phone] of Life Plan: Who lives with you: selfAny pets: noneDiet: Pt has a varied diet of protein, starch, and vegetables.  Pt reports eating little red meat.Exercise: Pt has not regular exercise routine but is very active.Seatbelts: Pt reports wearing seatbelt when in vehicle. Sun Exposure/Protection: Pt reports not using sun protectin.Hobbies: Reading, gardening, spending time with grandson, Ree Kida   Family history: There is longevity in many family members. The patient  has one brother and one sister who had myocardial infarctions at old age. There is no premature CAD in the family. Her parents did not have cardiac problems.  ROS: General: no fevers/chills. Positive for night sweats Eyes: no blurry vision, diplopia, or amaurosis ENT: no sore throat or hearing loss Resp: no cough, wheezing, or hemoptysis CV: no edema or palpitations GI: no abdominal pain, nausea, vomiting, diarrhea, or constipation GU: no dysuria, frequency, or hematuria Skin: no rash Neuro: no headache, numbness, tingling, or weakness of extremities Musculoskeletal: Positive for knee pain, osteoarthritis Heme: no bleeding, DVT, or easy bruising Endo: no polydipsia or  polyuria  BP 136/75  Pulse 68  Ht 5' 7.2" (1.707 m)  Wt 73.029 kg (161 lb)  BMI 25.07 kg/m2  PHYSICAL EXAM: Pt is alert and oriented, delightful elderly woman, in no distress. HEENT: normal Neck: JVP normal. Carotid upstrokes normal without bruits. No thyromegaly. Lungs: equal expansion, clear bilaterally CV: Apex is discrete and nondisplaced, RRR without murmur or gallop Abd: soft, NT, +BS, no bruit, no hepatosplenomegaly Back: no CVA tenderness Ext: no C/C/E        Femoral pulses 2+= without bruits        DP/PT pulses intact and = Skin: warm and dry without rash Neuro: CNII-XII intact             Strength intact = bilaterally  EKG:  Normal sinus rhythm 69 beats per minute, within normal limits  ASSESSMENT AND PLAN: 1. Shortness of breath. Etiology is unclear. I do not appreciate any evidence of structural heart disease on her physical exam. A 2-D echocardiogram is indicated in this 78 year old to rule out systolic or diastolic dysfunction.  2. Near syncope and weakness. Also unclear etiology. I'm not certain that this is a cardiac problem, but we must consider the possibility of bradyarrhythmia. I have recommended an event recorder to evaluate for arrhythmia. She was a long-time smoker and certainly has risk for coronary artery disease, but she does not have any symptoms of angina and her EKG is within normal limits. Will consider stress testing of her other cardiac studies are negative.  For followup, I will plan on seeing the patient back after her tests are completed. I reviewed her lab work that shows no evidence of kidney disease, anemia, thyroid disease, or hyperlipidemia.  Tonny Bollman 03/25/2012 12:11 PM

## 2012-03-25 NOTE — Patient Instructions (Addendum)
Your physician has requested that you have an echocardiogram. Echocardiography is a painless test that uses sound waves to create images of your heart. It provides your doctor with information about the size and shape of your heart and how well your heart's chambers and valves are working. This procedure takes approximately one hour. There are no restrictions for this procedure.  Your physician has recommended that you wear an event monitor. Event monitors are medical devices that record the heart's electrical activity. Doctors most often [place] these monitors to diagnose arrhythmias. Arrhythmias are problems with the speed or rhythm of the heartbeat. The monitor is a small, portable device. You can wear one while you do your normal daily activities. This is usually used to diagnose what is causing palpitations/syncope (passing out).  Your physician recommends that you schedule a follow-up appointment in: 4-6 WEEKS with Dr Excell Seltzer

## 2012-03-29 ENCOUNTER — Other Ambulatory Visit (HOSPITAL_COMMUNITY): Payer: Medicare Other

## 2012-04-04 ENCOUNTER — Other Ambulatory Visit (HOSPITAL_COMMUNITY): Payer: Medicare Other

## 2012-04-13 ENCOUNTER — Ambulatory Visit: Payer: Medicare Other | Admitting: Cardiovascular Disease

## 2012-05-10 ENCOUNTER — Telehealth: Payer: Self-pay | Admitting: *Deleted

## 2012-05-10 NOTE — Telephone Encounter (Signed)
Patient had last Annual Wellness visit in August 2012.  States she received a letter from our office to schedule an appt for 2013.  Patient left a message, but has not heard back from our office.  Explained that Arlys John is on maternity leave.  Patient informed that she will receive a call from Gearlean Alf, RN close to the end of January to schedule her appt.  Patient will call back the first week of February if she has not heard back from our office.  Gaylene Brooks, RN

## 2012-05-12 ENCOUNTER — Ambulatory Visit: Payer: Medicare Other | Admitting: Cardiovascular Disease

## 2012-05-13 ENCOUNTER — Other Ambulatory Visit: Payer: Self-pay | Admitting: Family Medicine

## 2012-05-13 ENCOUNTER — Telehealth: Payer: Self-pay | Admitting: Family Medicine

## 2012-05-13 NOTE — Telephone Encounter (Signed)
Chloe Lopez will be out in [place] for about 3 to 4 wks.  So she need to have a refill for enough to last her w hile she's there.

## 2012-06-30 ENCOUNTER — Ambulatory Visit (INDEPENDENT_AMBULATORY_CARE_PROVIDER_SITE_OTHER): Payer: Medicare Other | Admitting: Family Medicine

## 2012-06-30 ENCOUNTER — Encounter: Payer: Self-pay | Admitting: Family Medicine

## 2012-06-30 VITALS — BP 120/70 | HR 71 | Temp 98.4°F | Ht 66.0 in | Wt 161.0 lb

## 2012-06-30 DIAGNOSIS — J069 Acute upper respiratory infection, unspecified: Secondary | ICD-10-CM

## 2012-06-30 NOTE — Patient Instructions (Addendum)
If you have worsening symptoms or symptoms that do not resolve in 4-5 days please return to our clinic.  Please return in 3-4 weeks to have the R ear rechecked to be sure the fluid has resolve.   Viral Pharyngitis Viral pharyngitis is a viral infection that produces redness, pain, and swelling (inflammation) of the throat. It can spread from person to person (contagious). CAUSES Viral pharyngitis is caused by inhaling a large amount of certain germs called viruses. Many different viruses cause viral pharyngitis. SYMPTOMS Symptoms of viral pharyngitis include:  Sore throat.  Tiredness.  Stuffy nose.  Low-grade fever.  Congestion.  Cough. TREATMENT Treatment includes rest, drinking plenty of fluids, and the use of over-the-counter medication (approved by your caregiver). HOME CARE INSTRUCTIONS   Drink enough fluids to keep your urine clear or pale yellow.  Eat soft, cold foods such as ice cream, frozen ice pops, or gelatin dessert.  Gargle with warm salt water (1 tsp salt per 1 qt of water).  If over age 71, throat lozenges may be used safely.  Only take over-the-counter or prescription medicines for pain, discomfort, or fever as directed by your caregiver. Do not take aspirin. To help prevent spreading viral pharyngitis to others, avoid:  Mouth-to-mouth contact with others.  Sharing utensils for eating and drinking.  Coughing around others. SEEK MEDICAL CARE IF:   You are better in a few days, then become worse.  You have a fever or pain not helped by pain medicines.  There are any other changes that concern you. Document Released: 01/28/2005 Document Revised: 07/13/2011 Document Reviewed: 06/26/2010 [facility] Patient Information 2013 [place], [place].

## 2012-07-05 DIAGNOSIS — J069 Acute upper respiratory infection, unspecified: Secondary | ICD-10-CM | POA: Insufficient documentation

## 2012-07-05 NOTE — Telephone Encounter (Signed)
Call and scheduled AWV.

## 2012-07-05 NOTE — Progress Notes (Signed)
Patient ID: [name], female   DOB: 14-Sep-1923, 79 y.o.   MRN: [id number] SUBJECTIVE:  Chloe Lopez is a 79 y.o. female who complains of congestion, sore throat, cough described as productive of white and yellow sputum and otalgia in the L ear for 4-5 days. She denies a history of chest pain, chills, fevers, myalgias, nausea, shortness of breath and vomiting and does not a history of asthma. Patient does not smoke cigarettes.   OBJECTIVE: She appears well, vital signs are as noted. Ears with small amount of fluid behind R tm, L is normal.  Throat and pharynx normal.  Neck supple. No adenopathy in the neck. Nose is congested. Sinuses non tender. The chest is clear, without wheezes or rales.

## 2012-07-05 NOTE — Assessment & Plan Note (Signed)
ASSESSMENT:  viral upper respiratory illness with viral pharyngitis  PLAN: Symptomatic therapy suggested: push fluids, rest, gargle warm salt water, use acetaminophen, ibuprofen prn and return office visit prn if symptoms persist or worsen. Lack of antibiotic effectiveness discussed with her. Call or return to clinic prn if these symptoms worsen or fail to improve as anticipated.

## 2012-07-06 ENCOUNTER — Ambulatory Visit (INDEPENDENT_AMBULATORY_CARE_PROVIDER_SITE_OTHER): Payer: Medicare Other | Admitting: Family Medicine

## 2012-07-06 ENCOUNTER — Encounter: Payer: Self-pay | Admitting: Family Medicine

## 2012-07-06 VITALS — BP 135/72 | HR 74 | Temp 98.8°F | Wt 154.0 lb

## 2012-07-06 DIAGNOSIS — R0982 Postnasal drip: Secondary | ICD-10-CM

## 2012-07-06 DIAGNOSIS — J020 Streptococcal pharyngitis: Secondary | ICD-10-CM

## 2012-07-06 MED ORDER — IPRATROPIUM BROMIDE 0.03 % NA SOLN: 2.0000 | Freq: Two times a day (BID) | NASAL | Status: DC

## 2012-07-06 NOTE — Patient Instructions (Addendum)
You do not have strep throat. Most likely you have post nasal drip or post-viral irritation. You also have some fluid behind ears. Try nasal atrovent for next 1 week. You can use chloroseptic spray or gargling warm salt water for sore throat. If cough returns or you have fever, trouble swallowing or other concerns then call the doctor. Make an appointment for check up in one month or sooner if needed.

## 2012-07-06 NOTE — Assessment & Plan Note (Signed)
Persistent sore throat after URI. Negative strep and 0/4 centor criteria. Her cough is resolved, most likely this represents post nasal drip or post-viral irritation. She does have some probable serous otic effusions on exam, but doesn't seem to be related to acute infection or hearing issues currently. Recommend she continue her chronic anti-histamines, salt water gargles. Add nasal atrovent for symptom relief. Discussed red flags to return to care. Advised f/u in one month with PCP to evaluate effusions and hearing.

## 2012-07-06 NOTE — Progress Notes (Signed)
  Subjective:    Patient ID: Chloe Lopez, female    DOB: [birth year], 79 y.o.   MRN: [id number]  URI   Sore Throat     1. Sore throat. Patient complains of > 10 days significant sore throat. Last week she was seen with viral URI symptoms and recommended supportive care. Since that time she said she endured a cough productive of yellow phlegm that started to improve 2 days ago. Currently the sore throat is her only complaint. She still has some pressure behind left ear, but no trouble hearing.   She is a non-smoker.  She takes daily prednisone for urticaria.  She has exposure to her grandchild in 3rd grade, but he was not recently sick.  Review of Systems Endorses odynophagia. She denies any fever, chills, facial pain, headache, dyspnea, wheezing/rattling, rash, trouble swallowing, fatigue, weakness, lightheadedness.      Objective:   Physical Exam  Vitals reviewed. Constitutional: She is oriented to person, place, and time. She appears well-developed and well-nourished. No distress.  Appears well. Slight hoarseness.  HENT:  Head: Normocephalic and atraumatic.  Right Ear: External ear normal.  Left Ear: External ear normal.  Possible effusion B TMs. No erythema or dullness. Tonsillar hypertrophy symmetric. Moderate erythema. No exudates or plaques. No sinus TTP. No nasal discharge.   Eyes: EOM are normal. Pupils are equal, round, and reactive to light.  Neck: Normal range of motion. Neck supple. No thyromegaly present.  Cardiovascular: Normal rate, regular rhythm and normal heart sounds.   No murmur heard. Pulmonary/Chest: Effort normal and breath sounds normal. No stridor. No respiratory distress. She has no wheezes. She has no rales.  Musculoskeletal: She exhibits no edema.  Lymphadenopathy:    She has no cervical adenopathy.  Neurological: She is alert and oriented to person, place, and time.  Skin: No rash noted. She is not diaphoretic.  Psychiatric: She has a normal  mood and affect.          Assessment & Plan:

## 2012-07-07 ENCOUNTER — Telehealth: Payer: Self-pay | Admitting: *Deleted

## 2012-07-07 ENCOUNTER — Encounter: Payer: Self-pay | Admitting: Family Medicine

## 2012-07-07 ENCOUNTER — Ambulatory Visit (INDEPENDENT_AMBULATORY_CARE_PROVIDER_SITE_OTHER): Payer: Medicare Other | Admitting: Family Medicine

## 2012-07-07 VITALS — BP 148/84 | HR 72 | Temp 98.1°F | Ht 66.0 in | Wt 160.0 lb

## 2012-07-07 DIAGNOSIS — IMO0001 Reserved for inherently not codable concepts without codable children: Secondary | ICD-10-CM

## 2012-07-07 DIAGNOSIS — R1314 Dysphagia, pharyngoesophageal phase: Secondary | ICD-10-CM

## 2012-07-07 HISTORY — DX: Dysphagia, pharyngoesophageal phase: R13.14

## 2012-07-07 MED ORDER — ACETAMINOPHEN ER 650 MG PO TBCR: 650.0000 mg | EXTENDED_RELEASE_TABLET | Freq: Three times a day (TID) | ORAL | Status: DC | PRN

## 2012-07-07 NOTE — Telephone Encounter (Signed)
Patient calls stating she is having trouble swallowing and she was told to come back immediately if this happened again. She wants to come in right now. States she tried to eat a piece of bread this AM and would not go down. Sounds quite anxious over the phone. Advised to come to office now. Consulted with Dr. Sheffield Slider and he will see patient in [facility] .

## 2012-07-07 NOTE — Assessment & Plan Note (Signed)
resolving

## 2012-07-07 NOTE — Patient Instructions (Signed)
Get Acetaminophen 650 Slow Release or ER (generic of Tylenol Arthritis), and take one every 6 hours until the throat discomfort resolves

## 2012-07-07 NOTE — Progress Notes (Signed)
  Subjective:    Patient ID: Chloe Lopez, female    DOB: 1923-12-28, 79 y.o.   MRN: [id number]  HPI Her sore throat and nasal and ear congestion have improved, but hypopharyngeal discomfort and mild hoarsness have developed. Last evening she had a very frightening episode of dysphagia in the hypopharyngeal area, but did not bring food back up. She chronically has had difficulty swallowing her prednisone pills which she improves by wrapping it in bread. No problem with aspirating liquids. She had a barium swallow ordered by Dr Madilyn Fireman in 2009 that showed presbyesophagus. She hasn't had angioedema since she's been on prednisone, ranitidine, and doxepin. She denies waterbrash or prior sore throat or voice changes. No significant cough now. She didn't get the Ipratropium nose spray and hasn't been taking acetaminophen or ibuprofen for the throat discomfort.    Review of Systems  HENT: Positive for sore throat, trouble swallowing and voice change. Negative for sinus pressure.   Respiratory: Negative for chest tightness and wheezing.   Cardiovascular: Negative for chest pain.  Gastrointestinal: Positive for nausea. Negative for vomiting.       Objective:   Physical Exam  Constitutional: She appears well-developed and well-nourished.  HENT:  Right Ear: External ear normal.  Left Ear: External ear normal.  Nose: Nose normal.  Mouth/Throat: Oropharynx is clear and moist. No oropharyngeal exudate.  Eyes: Conjunctivae are normal.  Cardiovascular: Normal rate and regular rhythm.   No murmur heard. Pulmonary/Chest: Effort normal and breath sounds normal.  Abdominal: Soft. She exhibits no distension.  Neurological: She is alert. No cranial nerve deficit.  Psychiatric: She has a normal mood and affect.          Assessment & Plan:

## 2012-07-07 NOTE — Assessment & Plan Note (Signed)
Appears to relate to spasm rather than stricture. May relate to GERD that she can't recognize. She has been a smoker, so I think that a hypopharyngeal and laryngeal ENT exam are indicated. She is to take acetaminophen to improve any pain-spasm-pain cycle

## 2012-09-28 ENCOUNTER — Encounter: Payer: Self-pay | Admitting: Home Health Services

## 2012-09-28 ENCOUNTER — Ambulatory Visit (INDEPENDENT_AMBULATORY_CARE_PROVIDER_SITE_OTHER): Payer: Medicare Other | Admitting: Home Health Services

## 2012-09-28 VITALS — BP 137/79 | HR 61 | Temp 98.7°F | Ht 65.0 in | Wt 160.3 lb

## 2012-09-28 DIAGNOSIS — Z0000 Encounter for general adult medical examination without abnormal findings: Secondary | ICD-10-CM

## 2012-09-28 NOTE — Progress Notes (Signed)
Patient here for annual wellness visit, patient reports: Risk Factors/Conditions needing evaluation or treatment: Pt does not have any new risk factors that need evaluation. Home Safety: Pt lives by self in 2 story home.  Pt reports having smoke detectors and does not have adaptive equipment in bathroom. Other Information: Corrective lens: Pt wears corrective lens for reading.  Has regular eye exams. Dentures: Pt has a partial.  Has regular dental exams. Memory: Pt reports some problems with recalling words at times. Patient's Mini Mental Score (recorded in doc. flowsheet): 30 Pt reports not history of falls in the past year.    Annual Wellness Visit Requirements Recorded Today In  Medical, family, social history Past Medical, Family, Social History Section  Current providers Care team  Current medications Medications  Wt, BP, Ht, BMI Vital signs  Visual acuity (welcome visit) **  Hearing assessment (welcome visit) **  Tobacco, alcohol, illicit drug use History  ADL Nurse Assessment  Depression Screening Nurse Assessment  Cognitive impairment Nurse Assessment  Mini Mental Status Document Flowsheet  Fall Risk Fall/Depression  Home Safety Progress Note  End of Life Planning (welcome visit) Social Documentation  Medicare preventative services Progress Note  Risk factors/conditions needing evaluation/treatment Progress Note  Personalized health advice Patient Instructions, goals, letter  Diet & Exercise Social Documentation  Emergency Contact Social Documentation  Seat Belts Social Documentation  Sun exposure/protection Social Documentation

## 2012-11-02 ENCOUNTER — Encounter: Payer: Self-pay | Admitting: Family Medicine

## 2012-11-02 ENCOUNTER — Ambulatory Visit (HOSPITAL_COMMUNITY)
Admission: RE | Admit: 2012-11-02 | Discharge: 2012-11-02 | Disposition: A | Discharge status: Home / Self Care | Payer: Medicare Other | Source: Ambulatory Visit | Attending: Family Medicine | Admitting: Family Medicine

## 2012-11-02 ENCOUNTER — Ambulatory Visit (INDEPENDENT_AMBULATORY_CARE_PROVIDER_SITE_OTHER): Payer: Medicare Other | Admitting: Family Medicine

## 2012-11-02 VITALS — BP 152/81 | HR 67 | Ht 66.0 in | Wt 158.0 lb

## 2012-11-02 DIAGNOSIS — M7989 Other specified soft tissue disorders: Secondary | ICD-10-CM | POA: Insufficient documentation

## 2012-11-02 DIAGNOSIS — M79609 Pain in unspecified limb: Secondary | ICD-10-CM | POA: Insufficient documentation

## 2012-11-02 HISTORY — DX: Other specified soft tissue disorders: M79.89

## 2012-11-02 NOTE — Progress Notes (Signed)
*  Preliminary Results* Left lower extremity venous duplex completed. Left lower extremity is negative for deep vein thrombosis. There is no evidence of left Baker's cyst.  11/02/2012 3:58 PM Gertie Fey, RVT, RDCS, RDMS

## 2012-11-02 NOTE — Patient Instructions (Addendum)
It was nice to meet you today!  Since your ultrasound didn't show a blood clot, we are doing a blood test to help tell us if you have a blood clot. We will call you with this result.  If you develop any shortness of breath or chest pain, go to the ER immediately. Keep your appointment with Dr. Sheffield Slider for next Friday. He may want to repeat the ultrasound at that time.  You can call us if you have any concerns in the mean time.  Be well, Dr. Pollie Meyer

## 2012-11-02 NOTE — Progress Notes (Signed)
Patient ID: [name], female   DOB: 1924-01-11, 79 y.o.   MRN: [id number]  Name: Chloe Lopez Age/Sex: 79 y.o. female  HPI:  Chloe Lopez presents today complaining of swelling in her leg. One month ago she went to [place] for 3 weeks. She flew on a plane to get there. While there, she noticed that her ankles were swollen, which she attributed to the high altitude. She has since noted increased swelling in her left leg, as well as more prominent veins on her left foot. She has been back in [place] for 2 weeks. Her left ankle feels tight. She feels tired but not short of breath, and has no chest pain. She feels anxious because she is worried she might have a blood clot.  ROS: See HPI - no chest pain, SOB, + leg swelling  PMFSH: hx angioedema/hives on chronic prednisone Has never had blood clot before, no hx of bleeding disorders No trouble with falls, gets around easily but says she ambulates slowly.  PHYSICAL EXAM: BP 152/81  Pulse 67  Ht 5\' 6"  (1.676 m)  Wt 158 lb (71.668 kg)  BMI 25.51 kg/m2 Gen: NAD Heart: RRR Lungs: CTAB, NWOB Neuro: grossly nonfocal, speech intact Ext: 1+ pitting edema in bilateral lower extremities, L>R. No cords palpable. No erythema or warmth. Left posterior leg is tender behind knee.  Left calf measures 38cm in circumfirence, R calf measures 36.5 cm.

## 2012-11-02 NOTE — Assessment & Plan Note (Addendum)
With recent travel and now unilateral leg swelling, great concern for DVT (Wells score 2). No clinical concern for PE as pt not tachycardic or short of breath. Patient was sent over to the hospital to have a lower extremity doppler completed as an outpatient, which found no evidence of DVT. Pt then returned to clinic for further counseling. Given high clinical concern, will check d-dimer today. If d-dimer low, then risk of current DVT is also low. If d-dimer high, cannot rule DVT in or out. Pt has been instructed to follow up with her PCP Dr. Sheffield Slider next week (has appt already scheduled next Friday) to re-evaluate leg. Repeat venous doppler will likely be of utility at that time. Counseled patient on return precautions (chest pain, SOB, worsening leg symptoms).  Precepted with Dr. Mahala Menghini who also examined pt and agrees with this plan.

## 2012-11-03 ENCOUNTER — Telehealth: Payer: Self-pay | Admitting: Family Medicine

## 2012-11-03 NOTE — Telephone Encounter (Signed)
Returned call to pt. Explained that d-dimer was elevated but that does not necessarily mean she does or does not have a DVT. Reminded her to return in 1 week for repeat evaluation and likely for f/u ultrasound. Advised to go to ER if she has any chest pain, SOB, or worsening swelling in her leg. She understands these instructions and actually reports that the swelling seems improved today. Encouraged her to still keep the f/u appointment with Dr. Sheffield Slider. Pt appreciative of the call.

## 2012-11-03 NOTE — Telephone Encounter (Signed)
Pt had lab work yesterday for possible blood clot. Dr Pollie Meyer said she would call today with results She has not been called Please advise

## 2012-11-03 NOTE — Telephone Encounter (Signed)
Will fwd to MD.  Jaskiran Pata L, CMA  

## 2012-11-07 ENCOUNTER — Ambulatory Visit: Payer: Medicare Other | Admitting: Family Medicine

## 2012-11-10 ENCOUNTER — Ambulatory Visit: Payer: Medicare Other

## 2012-11-10 ENCOUNTER — Encounter: Payer: Self-pay | Admitting: Family Medicine

## 2012-11-10 ENCOUNTER — Ambulatory Visit (INDEPENDENT_AMBULATORY_CARE_PROVIDER_SITE_OTHER): Payer: Medicare Other | Admitting: Family Medicine

## 2012-11-10 VITALS — BP 130/75 | HR 67 | Ht 66.0 in | Wt 156.4 lb

## 2012-11-10 DIAGNOSIS — H9193 Unspecified hearing loss, bilateral: Secondary | ICD-10-CM

## 2012-11-10 DIAGNOSIS — I998 Other disorder of circulatory system: Secondary | ICD-10-CM

## 2012-11-10 DIAGNOSIS — M7989 Other specified soft tissue disorders: Secondary | ICD-10-CM

## 2012-11-10 DIAGNOSIS — H9190 Unspecified hearing loss, unspecified ear: Secondary | ICD-10-CM

## 2012-11-10 DIAGNOSIS — I878 Other specified disorders of veins: Secondary | ICD-10-CM

## 2012-11-10 DIAGNOSIS — I872 Venous insufficiency (chronic) (peripheral): Secondary | ICD-10-CM | POA: Insufficient documentation

## 2012-11-10 MED ORDER — DOXEPIN HCL 10 MG PO CAPS: 30.0000 mg | ORAL_CAPSULE | Freq: Every day | ORAL | Status: DC

## 2012-11-10 NOTE — Assessment & Plan Note (Signed)
No evidence of DVT with lower extremity venous doppler. Etiology for D-dimer elevation is unclear. DVT in lower extremity appears unlikely given the fact that swelling has significantly improved, almost to normal. Lower extremity swelling likely from venous insufficiency in setting of prolonged sitting.  Recommended continued leg elevation, activity. Reviewed red flags for return such as new onset one sided swelling, shortness of breath, chest pain.Marland KitchenMarland Kitchen

## 2012-11-10 NOTE — Assessment & Plan Note (Addendum)
Concern brought up by daughter and patient.  No signs or symptoms of superior vena cava syndrome that could be contributing to this (no swelling in face, no flushing, no prominent veins on torso, no h/o smoking or cough).  Likely from vasodilation brought on by warmer weather.

## 2012-11-10 NOTE — Progress Notes (Signed)
Patient ID: Chloe Lopez    DOB: 11-05-1923, 79 y.o.   MRN: [id number] --- Subjective:  Neddie is a 79 y.o.female with h/o angioedema who presents for follow up of left lower extremity swelling. Patient is accompanied by her daughter Kriste Basque.   - patient was seen in the office on 11/02/12 with swelling in left lower leg. She had lower extremity venous doppler without evidence of DVT. She had a follow up d-dimer which was elevated.  Swelling started in mid June after a trip to [place]. She noticed swelling in both legs, but left significantly more than right. Associated with the swelling, she also noticed prominence of her veins in the left leg. The plane trip involved extensive periods of sitting (both in wheel chair for ambulation through airport as well as on the 4hr plane trip and sitting waiting for flight delays). She had experienced lower extremity swelling in the past while visiting [place], but swelling would quickly subside upon return to [place]. This last episode was different in that swelling did not subside with leg elevation or time.  It was not associated with any calf pain, any shortness of breath, any chest pain. She had not started any new medications.  Since her last visit, she has elevated her legs as well as started taking aspirin 81mg  daily. Four days ago, the swelling significantly reduced and leg size is now almost back to normal.   - Patient and her daughter also bring up concern for dilation of her veins. They noticed that her veins became acutely more visible and prominent in hands and lower legs since that trip.They were concerned as to what may be causing this.  Denies any flushing of the face, no coughing, no dysphagia, no arm swelling. no history of smoking    ROS: see HPI Past Medical History: reviewed and updated medications and allergies. Social History: Tobacco: none Patient lives alone. Her daughter Kriste Basque lives close by and talks with her on a daily basis.    Objective: Filed Vitals:   11/10/12 0909  BP: 130/75  Pulse: 67    Physical Examination:   General appearance - alert, well appearing, and in no distress Ears - bilateral TM's and external ear canals normal Nose - normal and patent, no erythema, discharge or polyps Mouth - mucous membranes moist, pharynx normal without lesions or obstruction or swelling Eyes - area of erythema and swelling below left eye Neck - supple, no significant adenopathy, no JVD or prominent neck veins Chest - clear to auscultation, no wheezes, rales or rhonchi, symmetric air entry Heart - normal rate, regular rhythm, normal S1, S2, no murmurs Abdomen - soft, nontender, nondistended, no masses or organomegaly Lymphatics - no prominent axillary or cervical lymph nodes Hands - thin skin, prominent veins along hands Lower extremities - mild soft tissue swelling in lower extremities bilaterally and equally, no pitting edema, prominent veins on anterior aspect of foot (left more than right), no calf tenderness, negative Homan's sign Right calf diameter: 35.5cm Left calf diameter: 36cm Dorsalis pedis pulse strong and equal bilaterally  Geriatric Function Screen: Significant for hearing: feels like she sometimes needs the TV to be louder, otherwise hearing has not caused trouble with interacting with family or talking over the phone.  Audioscope results: unable to hear 1000Hz  bilaterally Otherwise, screen was normal and patient rated her health as good-excellent compared to others her age.   Mini Mental Status: done on 09/28/12: 30

## 2012-11-10 NOTE — Assessment & Plan Note (Signed)
Audioscope screen showing significant hearing loss, although findings do not seem to correlate with clinical presentation. Patient and daughter do not appear to have noticed a significant problem with hearing at this time.  Since it doesn't appear to be bothering her at this time, will hold on audiology referral. Chloe Lopez over some simple measures and devices for amplification of sound in case this interests her. Continue to monitor

## 2012-12-15 ENCOUNTER — Ambulatory Visit (INDEPENDENT_AMBULATORY_CARE_PROVIDER_SITE_OTHER): Payer: Medicare Other | Admitting: Family Medicine

## 2012-12-15 VITALS — BP 132/82 | HR 62 | Temp 98.1°F | Ht 66.0 in | Wt 162.0 lb

## 2012-12-15 DIAGNOSIS — H00016 Hordeolum externum left eye, unspecified eyelid: Secondary | ICD-10-CM

## 2012-12-15 DIAGNOSIS — I872 Venous insufficiency (chronic) (peripheral): Secondary | ICD-10-CM

## 2012-12-15 DIAGNOSIS — H00019 Hordeolum externum unspecified eye, unspecified eyelid: Secondary | ICD-10-CM | POA: Insufficient documentation

## 2012-12-15 DIAGNOSIS — I8390 Asymptomatic varicose veins of unspecified lower extremity: Secondary | ICD-10-CM

## 2012-12-15 DIAGNOSIS — L509 Urticaria, unspecified: Secondary | ICD-10-CM

## 2012-12-15 DIAGNOSIS — M7989 Other specified soft tissue disorders: Secondary | ICD-10-CM

## 2012-12-15 MED ORDER — TRAMADOL HCL 50 MG PO TABS: 50.0000 mg | ORAL_TABLET | Freq: Three times a day (TID) | ORAL | Status: DC | PRN

## 2012-12-15 NOTE — Progress Notes (Signed)
Patient ID: Chloe Lopez    DOB: 11/02/1923, 79 y.o.   MRN: [id number] --- Subjective:  Chloe Lopez is a 79 y.o.female who presents for follow up.  Her concerns include the following:  - lower leg swelling and prominent veins: lower extremity swelling has resolved since her last visit. She has noticed prominent veins in her lower extremities and is concerned about them. She wants to know if she needs to be evaluated by a vascular specialist. She denies any pain. She reports that veins become less dilated after she puts her feet up. Denies any calf pain. Denies any numbness or tingling in lower extremities.  With lower extremity swelling which has significantly improved, she denies any shortness of breath, orthopnea or PND.  - tramadol refill: takes it for various aches and pains but has noticed that it has significantly helped with the itching and swelling of her chronic hives. She takes one a day at most.  - stye in left eye: started yesterday. Used warm compresses which have helped with swelling. No change in vision. No pain in the eye. No drainage.   ROS: see HPI Past Medical History: reviewed and updated medications and allergies. Social History: Tobacco: none  Objective: Filed Vitals:   12/15/12 0907  BP: 132/82  Pulse: 62  Temp: 98.1 F (36.7 C)    Physical Examination:   General appearance - alert, well appearing, and in no distress Eye - mildly swollen left lower eyelid without significant erythema. No drainage. EOMI, PERRLA Chest - clear to auscultation, no wheezes, rales or rhonchi, symmetric air entry Heart - normal rate, regular rhythm, normal S1, S2, no murmurs Abdomen - soft, nontender, nondistended, no masses or organomegaly Extremities - dorsalis pedis pulses present bilaterally, trace pitting pretibial edema bilaterally, dilated superficial veins on foot and ankle bilaterally, small superficial, non tender veins on lateral aspect of right leg.

## 2012-12-15 NOTE — Assessment & Plan Note (Signed)
Stye on left eye. Warm compresses.

## 2012-12-15 NOTE — Patient Instructions (Addendum)
Make sure you wear the compression stockings on your flight

## 2012-12-15 NOTE — Assessment & Plan Note (Signed)
Refilled tramadol as it helps with discomfort of chronic hives and swelling.

## 2012-12-15 NOTE — Assessment & Plan Note (Signed)
Dilated veins likely from venous insufficiency. Reassured patient that vascular referral is not indicated at this time. Explained in detail mechanism of valve insufficiency in veins.  Wrote Rx for compression stockings. Recommended stocking use when she travels to go to [place].

## 2012-12-15 NOTE — Assessment & Plan Note (Addendum)
Resolved. Recommended chair exercises and compression stocking use when she flies back to [place] in the next few weeks.  No evidence of heart failure, kidney disease or liver disease. Patient is due for routine labs in September. PAtient instructed to follow up with PCP.

## 2012-12-29 ENCOUNTER — Ambulatory Visit (INDEPENDENT_AMBULATORY_CARE_PROVIDER_SITE_OTHER): Payer: Medicare Other | Admitting: General Surgery

## 2013-03-08 ENCOUNTER — Ambulatory Visit: Payer: Medicare Other

## 2013-03-13 ENCOUNTER — Encounter: Payer: Self-pay | Admitting: Family Medicine

## 2013-03-13 ENCOUNTER — Ambulatory Visit (INDEPENDENT_AMBULATORY_CARE_PROVIDER_SITE_OTHER): Payer: Medicare Other | Admitting: Family Medicine

## 2013-03-13 VITALS — BP 140/77 | HR 60 | Ht 66.0 in | Wt 162.0 lb

## 2013-03-13 DIAGNOSIS — M7989 Other specified soft tissue disorders: Secondary | ICD-10-CM

## 2013-03-13 DIAGNOSIS — M791 Myalgia, unspecified site: Secondary | ICD-10-CM | POA: Insufficient documentation

## 2013-03-13 DIAGNOSIS — R609 Edema, unspecified: Secondary | ICD-10-CM

## 2013-03-13 DIAGNOSIS — IMO0001 Reserved for inherently not codable concepts without codable children: Secondary | ICD-10-CM

## 2013-03-13 DIAGNOSIS — R600 Localized edema: Secondary | ICD-10-CM

## 2013-03-13 DIAGNOSIS — Z23 Encounter for immunization: Secondary | ICD-10-CM

## 2013-03-13 LAB — COMPREHENSIVE METABOLIC PANEL
Alkaline Phosphatase: 80 U/L (ref 39–117)
BUN: 15 mg/dL (ref 6–23)
CO2: 25 mEq/L (ref 19–32)
Chloride: 103 mEq/L (ref 96–112)
Creat: 0.93 mg/dL (ref 0.50–1.10)
Glucose, Bld: 90 mg/dL (ref 70–99)
Sodium: 138 mEq/L (ref 135–145)
Total Bilirubin: 0.4 mg/dL (ref 0.3–1.2)

## 2013-03-13 LAB — CK: Total CK: 17 U/L (ref 7–177)

## 2013-03-13 LAB — POCT SEDIMENTATION RATE: POCT SED RATE: 8 mm/hr (ref 0–22)

## 2013-03-13 NOTE — Assessment & Plan Note (Signed)
Assessment: lower extremities exquisitely tender to palpation out of proportion; out of proportion to what we expected given how well she appears; not accompanied by weakness Plan: check CK and ESR to evaluate for myositis

## 2013-03-13 NOTE — Assessment & Plan Note (Signed)
Assessment: mild, nonpitting edema not concerning for systemic illness but more likely venous insufficiency Plan: Given that the course is improving gradually, she'll continue with stockings and I will check a metabolic panel today to evaluate her albumin level given her elderly age

## 2013-03-13 NOTE — Patient Instructions (Signed)
It was nice to see you today. I think we need to follow up a few blood tests that might give [place] an indication of your tenderness and swelling of the legs.   For now, continue using the halloween stockings. I will contact you with the results.   Sincerely,   Dr. Clinton Sawyer

## 2013-03-13 NOTE — Progress Notes (Signed)
  Subjective:    Patient ID: Chloe Lopez, female    DOB: [birth year], 79 y.o.   MRN: [id number]  HPI  79 year old F who presents for leg swelling and hip pain.   The swelling started in her legs in July 2014 after a trip tp [place]. She waws evaluated with an duplex doppler that was negative for DVT. She was prescribed compression stockings, but has not been able to wear them due to difficulty getting them on. Overall, she notes that the swelling is improved with some stockings that her daughter gave her from an old halloween costume but she still has pain in er calves. They are described as being tender.   Review of Systems     Objective:   Physical Exam BP 140/77  Pulse 60  Ht 5\' 6"  (1.676 m)  Wt 162 lb (73.483 kg)  BMI 26.16 kg/m2  Gen: elderly, well appearing, pleasant and conversant, alert and oriented x 3  Legs: mild fullness with non-pitting edema, numerous varicosities bilaterally, no hemosiderin deposition  MSK: 5/5 strength upper and lower extremities bilaterally, marked tenderness to palpation of calves and anterior tibialis        Assessment & Plan:

## 2013-03-14 ENCOUNTER — Telehealth: Payer: Self-pay | Admitting: Family Medicine

## 2013-03-14 NOTE — Telephone Encounter (Signed)
Please call patient to let her know that all lab results were normal.

## 2013-03-15 NOTE — Telephone Encounter (Signed)
Pt notified.  Cyprian Gongaware L, CMA  

## 2013-04-17 ENCOUNTER — Ambulatory Visit: Payer: Medicare Other | Admitting: Family Medicine

## 2013-04-17 ENCOUNTER — Ambulatory Visit (INDEPENDENT_AMBULATORY_CARE_PROVIDER_SITE_OTHER): Payer: Medicare Other | Admitting: Family Medicine

## 2013-04-17 ENCOUNTER — Encounter: Payer: Self-pay | Admitting: Family Medicine

## 2013-04-17 VITALS — BP 132/76 | HR 62 | Ht 66.0 in | Wt 161.0 lb

## 2013-04-17 DIAGNOSIS — M5432 Sciatica, left side: Secondary | ICD-10-CM

## 2013-04-17 DIAGNOSIS — M5430 Sciatica, unspecified side: Secondary | ICD-10-CM

## 2013-04-17 DIAGNOSIS — R5381 Other malaise: Secondary | ICD-10-CM

## 2013-04-17 DIAGNOSIS — R531 Weakness: Secondary | ICD-10-CM

## 2013-04-17 LAB — CBC
HCT: 43.3 % (ref 36.0–46.0)
Hemoglobin: 14.8 g/dL (ref 12.0–15.0)
MCHC: 34.2 g/dL (ref 30.0–36.0)
MCV: 91.2 fL (ref 78.0–100.0)
RBC: 4.75 MIL/uL (ref 3.87–5.11)
WBC: 7.7 10*3/uL (ref 4.0–10.5)

## 2013-04-17 MED ORDER — TRAMADOL HCL 50 MG PO TABS: 50.0000 mg | ORAL_TABLET | Freq: Three times a day (TID) | ORAL | Status: DC | PRN

## 2013-04-17 NOTE — Progress Notes (Signed)
Patient ID: Chloe Lopez, female   DOB: Oct 16, 1923, 79 y.o.   MRN: [id number]  HPI:  Chronic low back pain: requests refill on tramadol. She's going out of state in early January and needs refills to last her on that trip.The pain has flared up recently over the last 2-3 months. She used to take tramadol just once per day but is not requiring it up to twice per day. The tramadol does help, she just needs more of it. The pain occurs whenever she is lying down and is improved with sitting and walking. It happens about 1-3 times per week and is unpredictable. It is a shooting pain down her hip/left leg. It's always when she's lying down. Last night it woke her up. It's sharp/shooting and then becomes a dull pain. She has not noticed any side effects from the tramadol. Does not take any over the counter pain medicine.   Weakness: Generally has felt weak over the last 8+ months. She takes doxepin for chronic hives/angioedema and wonders if her weakness is related to taking this medicine. Has had a somewhat decreased appetite but is drinking liquids okay. Denies any symptoms of passing out or feeling like she is going to pass out.   ROS: See HPI  PMFSH: hx chronic angioedema/hives, takes doxepin for this as antihistamine, also takes zantac and prednisone chronically -supposed to take baby aspirin each day but frequently forgets to take this  PHYSICAL EXAM: BP 132/76  Pulse 62  Ht 5\' 6"  (1.676 m)  Wt 161 lb (73.029 kg)  BMI 26.00 kg/m2 Gen: NAD HEENT: NCAT, no cervical lymphadenopathy, no thyromegaly or nodules Heart: RRR Lungs: CTAB Neuro: grossly nonfocal, speech intact, A&O x3, patellar reflexes minimal but equal bilat Ext: neg straight leg raise bilat, no edema bilat Back: lumbosacral area nontender to palpation  ASSESSMENT/PLAN:  # Weakness: subjectively reported. Patient is quite well-appearing today. Weight is stable. Recently had normal electrolytes, but has not had recent CBC or TSH  checked. Will check these today. Would like her to also be evaluated by physical therapy, but she prefers to wait until after her trip to [place] to do this.  See problem based charting for additional assessment/plan.  FOLLOW UP: F/u after trip to [place] for back pain.  [place] J. Pollie Meyer, MD [facility]

## 2013-04-17 NOTE — Patient Instructions (Signed)
It was great to see you again today!  We are checking some bloodwork today. I will call you if your test results are not normal.  Otherwise, I will send you a letter.  If you do not hear from me with in 2 weeks please call our office.     I refilled your tramadol. Hopefully we can get your pain better controlled.  Have a great trip to [place]!!  Be well, Dr. Pollie Meyer

## 2013-04-18 ENCOUNTER — Encounter: Payer: Self-pay | Admitting: Family Medicine

## 2013-04-19 DIAGNOSIS — M5430 Sciatica, unspecified side: Secondary | ICD-10-CM | POA: Insufficient documentation

## 2013-04-19 NOTE — Assessment & Plan Note (Signed)
Exam unremarkable today. Hx is slightly unusual as the pain only happens when she lays down. Last imaging was in March 2013 and showed degenerative changes and old compression fx. Had DEXA scan 2 years ago which was diagnostic of osteopenia.   As pain has responded to tramadol, will continue this medicine. If pain persists or worsens after the New Year, will consider re-imaging as pt does have hx of breast cancer and would want to rule out metastatic disease. Would also like PT eval for weakness but will have to wait until after her trip to [place]. Tramadol refilled today.

## 2013-05-26 DIAGNOSIS — M810 Age-related osteoporosis without current pathological fracture: Secondary | ICD-10-CM | POA: Diagnosis not present

## 2013-05-26 DIAGNOSIS — T783XXA Angioneurotic edema, initial encounter: Secondary | ICD-10-CM | POA: Diagnosis not present

## 2013-05-26 DIAGNOSIS — Z23 Encounter for immunization: Secondary | ICD-10-CM | POA: Diagnosis not present

## 2013-05-26 DIAGNOSIS — R5383 Other fatigue: Secondary | ICD-10-CM | POA: Diagnosis not present

## 2013-05-26 DIAGNOSIS — R609 Edema, unspecified: Secondary | ICD-10-CM | POA: Diagnosis not present

## 2013-05-26 DIAGNOSIS — R5381 Other malaise: Secondary | ICD-10-CM | POA: Diagnosis not present

## 2013-05-26 NOTE — Progress Notes (Signed)
Patient ID: Chloe Lopez, female   DOB: [birth year], 80 y.o.   MRN: [id number]  I have reviewed this visit and discussed with Lamont Dowdy and agree with her documentation.  Leeanne Rio, MD

## 2013-08-21 ENCOUNTER — Ambulatory Visit (INDEPENDENT_AMBULATORY_CARE_PROVIDER_SITE_OTHER): Payer: Medicare Other | Admitting: Family Medicine

## 2013-08-21 ENCOUNTER — Encounter: Payer: Self-pay | Admitting: Family Medicine

## 2013-08-21 VITALS — BP 154/82 | HR 62 | Temp 97.6°F | Ht 66.0 in | Wt 148.0 lb

## 2013-08-21 DIAGNOSIS — N390 Urinary tract infection, site not specified: Secondary | ICD-10-CM

## 2013-08-21 DIAGNOSIS — R300 Dysuria: Secondary | ICD-10-CM

## 2013-08-21 DIAGNOSIS — M7989 Other specified soft tissue disorders: Secondary | ICD-10-CM

## 2013-08-21 LAB — POCT URINALYSIS DIPSTICK
Bilirubin, UA: NEGATIVE
Blood, UA: NEGATIVE
Glucose, UA: NEGATIVE
Ketones, UA: NEGATIVE
NITRITE UA: NEGATIVE
Protein, UA: NEGATIVE
Spec Grav, UA: <=1.005
Urobilinogen, UA: 0.2
pH, UA: 6

## 2013-08-21 LAB — POCT UA - MICROSCOPIC ONLY

## 2013-08-21 MED ORDER — CEPHALEXIN 500 MG PO CAPS: 500.0000 mg | ORAL_CAPSULE | Freq: Two times a day (BID) | ORAL | Status: DC

## 2013-08-21 MED ORDER — CEPHALEXIN 500 MG PO CAPS: 500.0000 mg | ORAL_CAPSULE | Freq: Four times a day (QID) | ORAL | Status: DC

## 2013-08-21 MED ORDER — TRAMADOL HCL 50 MG PO TABS: 50.0000 mg | ORAL_TABLET | Freq: Every day | ORAL | Status: DC | PRN

## 2013-08-21 NOTE — Patient Instructions (Signed)
Ms. Kochel,   Thank you for coming to clinic today. Please read below regarding the issues that we discussed.   1. Leg Swelling - This is due to changes in your vessels from age and also taking prednisone. Please continue to use compression stockings were Velcro wraps. Additionally, I would talk to her allergist about stopping the prednisone. I also have long-term concerns about her use of doxepin, which could increase your risk of falls. Otherwise, there is no other workup that needs to be done at this point.  2. Urinary frequency - you do have evidence of a urinary tract infection on your urine studies. Therefore I will send a urine for culture. I like you to start an antibiotic called Keflex. Please take it twice a day for 7 days. Please take the entire course. Please followup if he starts to develop nausea, vomiting, or fever.  Please follow up in clinic in 2 months. Please call earlier if you have any questions or concerns.   Sincerely,   Dr. Maricela Bo

## 2013-08-21 NOTE — Progress Notes (Signed)
   Subjective:    Patient ID: Chloe Lopez, female    DOB: Jun 27, 1923, 80 y.o.   MRN: [id number]  HPI  80 year old with leg swelling and dysuria.   Dysuria - 3 days duration, associated with frequency, no nausea, vomiting, or fever, no hx of UTI  Leg Swelling - bilateral, chronic, worsened when going [place] where she has been for 3 months, has tried compression hose but this was too painful, so she tried a compression velcro sleeve, feels like they were very helpful, her primary concern today is the pain associated with swelling that is a "pin point burst" which lasts for < 5 minutes and may not recur for several days; pt still taking daily doxepin and prednisone for angioedema for 15 years for purposes of angioedema control from unknown cause,   Current Outpatient Prescriptions on File Prior to Visit  Medication Sig Dispense Refill  . aspirin 81 MG tablet Take 81 mg by mouth daily.      . calcium-vitamin D (OSCAL WITH D) 500-200 MG-UNIT per tablet Take 2 tablets by mouth 2 (two) times daily. Pt takes occasionally      . doxepin (SINEQUAN) 10 MG capsule Take 3 capsules (30 mg total) by mouth at bedtime.      . Multiple Vitamin (MULTIVITAMIN) tablet Take 1 tablet by mouth daily. Pt takes every so often      . predniSONE (DELTASONE) 5 MG tablet Take 5 mg by mouth daily.       . Ranitidine HCl (ZANTAC PO) Take 150 mg by mouth. 300mg  tabs twice a day      . traMADol (ULTRAM) 50 MG tablet Take 1 tablet (50 mg total) by mouth every 8 (eight) hours as needed.  60 tablet  3  . [DISCONTINUED] DOXEPIN HCL PO Take by mouth. 30 mg  Twice a day       No current facility-administered medications on file prior to visit.   '   Review of Systems Negative for abdominal fullness, pain, weight loss or decreased      Objective:   Physical Exam BP 154/82  Pulse 62  Temp(Src) 97.6 F (36.4 C) (Oral)  Ht 5\' 6"  (1.676 m)  Wt 148 lb (67.132 kg)  BMI 23.90 kg/m2 General: elderly WF, pleasant and  conversant, mildly distressed LE: bilateral non pitting edema, mild tenderness of her  Abd: non distended, non tender Back: no CVA tenderness Psych: normal affect, pt with unrealistic expectations for her functional status based upon age and perseverates on mild superficial vascular changes of her legs      Assessment & Plan:

## 2013-08-22 DIAGNOSIS — R300 Dysuria: Secondary | ICD-10-CM | POA: Insufficient documentation

## 2013-08-22 NOTE — Assessment & Plan Note (Signed)
A: consistent with UTI and no concern for pyelonephritis P: treat with keflex, f/u culture

## 2013-08-22 NOTE — Assessment & Plan Note (Signed)
A: stable edema, likely combination of venous insufficiency and chronic prednisone use; no concern for PE, very unlikely to be pelvic mass P: continue leg compression, encouraged her to talk to allergist regarding prednisone use

## 2013-08-23 LAB — URINE CULTURE

## 2013-08-25 ENCOUNTER — Ambulatory Visit: Payer: Medicare Other | Admitting: Family Medicine

## 2013-10-19 ENCOUNTER — Other Ambulatory Visit: Payer: Self-pay | Admitting: Family Medicine

## 2013-10-19 ENCOUNTER — Encounter: Payer: Self-pay | Admitting: Family Medicine

## 2013-10-19 NOTE — Progress Notes (Signed)
Please let pt know her tramadol Rx can be picked up at her convenience.   Thanks, Tamela Oddi. Awanda Mink, DO of [facility] 10/19/2013, 4:50 PM

## 2013-10-22 ENCOUNTER — Other Ambulatory Visit: Payer: Self-pay | Admitting: Family Medicine

## 2013-10-23 ENCOUNTER — Other Ambulatory Visit: Payer: Self-pay | Admitting: *Deleted

## 2013-10-23 NOTE — Progress Notes (Signed)
Called and informed patient that her Tramadol Rx is ready for her to pick up

## 2013-10-26 ENCOUNTER — Other Ambulatory Visit: Payer: Self-pay | Admitting: Family Medicine

## 2013-10-27 ENCOUNTER — Other Ambulatory Visit: Payer: Self-pay | Admitting: *Deleted

## 2013-10-27 NOTE — Telephone Encounter (Signed)
Received message from Rodman Key, Pharmacist stating that the Rx for Tramadol was not received on 10/19/2013.  Last time pt had Rx fill was 08/21/13 per Rodman Key.  Please give him a call to discuss refill on Tramadol at [phone].  Chloe Barrow, RN

## 2013-10-27 NOTE — Telephone Encounter (Signed)
Dr. Awanda Mink wrote an rx for tramadol and put it at the front for pt to pick up. Can you look to see if that rx is still there, and if it is, fax it over to her pharmacy? Thanks, Leeanne Rio, MD

## 2013-10-27 NOTE — Telephone Encounter (Signed)
Called in prescription to pharmacy and spoke to [place]. Thanks, [employer]

## 2013-12-07 DIAGNOSIS — L509 Urticaria, unspecified: Secondary | ICD-10-CM | POA: Diagnosis not present

## 2013-12-07 DIAGNOSIS — L821 Other seborrheic keratosis: Secondary | ICD-10-CM | POA: Diagnosis not present

## 2013-12-07 DIAGNOSIS — D1739 Benign lipomatous neoplasm of skin and subcutaneous tissue of other sites: Secondary | ICD-10-CM | POA: Diagnosis not present

## 2013-12-07 DIAGNOSIS — L570 Actinic keratosis: Secondary | ICD-10-CM | POA: Diagnosis not present

## 2013-12-07 DIAGNOSIS — L723 Sebaceous cyst: Secondary | ICD-10-CM | POA: Diagnosis not present

## 2013-12-11 DIAGNOSIS — Z961 Presence of intraocular lens: Secondary | ICD-10-CM | POA: Diagnosis not present

## 2013-12-11 DIAGNOSIS — H52209 Unspecified astigmatism, unspecified eye: Secondary | ICD-10-CM | POA: Diagnosis not present

## 2013-12-11 DIAGNOSIS — H35369 Drusen (degenerative) of macula, unspecified eye: Secondary | ICD-10-CM | POA: Diagnosis not present

## 2013-12-12 ENCOUNTER — Ambulatory Visit (INDEPENDENT_AMBULATORY_CARE_PROVIDER_SITE_OTHER): Payer: Medicare Other | Admitting: Family Medicine

## 2013-12-12 ENCOUNTER — Encounter: Payer: Self-pay | Admitting: Family Medicine

## 2013-12-12 VITALS — BP 170/76 | HR 65 | Temp 97.6°F | Ht 66.0 in | Wt 153.4 lb

## 2013-12-12 DIAGNOSIS — I872 Venous insufficiency (chronic) (peripheral): Secondary | ICD-10-CM | POA: Diagnosis not present

## 2013-12-12 DIAGNOSIS — T783XXA Angioneurotic edema, initial encounter: Secondary | ICD-10-CM | POA: Diagnosis not present

## 2013-12-12 DIAGNOSIS — L500 Allergic urticaria: Secondary | ICD-10-CM | POA: Diagnosis not present

## 2013-12-12 NOTE — Patient Instructions (Signed)
I think that your swelling is due to venous insufficiency (weak valves in your legs). Please wear your compression devices during the day. Take them off and elevate your legs at night.  Please ask your Allergist if you can decrease your Prednisone to 2.5 mg daily.

## 2013-12-12 NOTE — Assessment & Plan Note (Signed)
Stable Venous insufficiency. No concern for cardiac/renal/hepatic cause. Oral prednisone may be contributing to swelling. - counseled to continue daily compression device -elevate legs as tolerated -discuss decreasing prednisone dose with Allergist

## 2013-12-12 NOTE — Progress Notes (Signed)
   Subjective:    Patient ID: Chloe Lopez, female    DOB: 1923-10-31, 80 y.o.   MRN: [id number]  HPI 80 y/o female presents for bilateral leg swelling, has been present over the past year, has been evaluated by Dr. Maricela Bo in 08/2012 and told that swelling likely due to venous insufficiency, swelling is worse at the end of the day, improved with foot elevation, no associated chest pain/orthopnea/sob/PND, wears compression device during the day (serial straps that cause compression, unable to get compression stockings on previously), currently on Prednisone 5 mg daily for history of angioedema/hives.    Review of Systems See above    Objective:   Physical Exam Vitals: reviewed Gen: pleasant female, NAD Cardiac: RRR, S1 and S2 present, no murmurs, no heaves/thrills Resp: CTAB, normal effort Ext: 2+ bilateral edema to mid shin, 2+ DP pulses, no calf tenderness Skin: minimal anterior shin hyperpigmentation from chronic stasis, no skin breakdown, dilated veins in bilateral feet  12/14 - TSH and CBC wnl 11/14 - CMP wnl 7/14 - LE doppler negative for DVT  No previous Echocardiogram     Assessment & Plan:  Please see problem specific assessment and plan.

## 2014-02-19 DIAGNOSIS — Z87891 Personal history of nicotine dependence: Secondary | ICD-10-CM | POA: Diagnosis not present

## 2014-02-19 DIAGNOSIS — R42 Dizziness and giddiness: Secondary | ICD-10-CM | POA: Diagnosis not present

## 2014-02-19 DIAGNOSIS — R404 Transient alteration of awareness: Secondary | ICD-10-CM | POA: Diagnosis not present

## 2014-03-20 DIAGNOSIS — Z23 Encounter for immunization: Secondary | ICD-10-CM | POA: Diagnosis not present

## 2014-05-02 ENCOUNTER — Encounter: Payer: Self-pay | Admitting: Family Medicine

## 2014-05-02 ENCOUNTER — Ambulatory Visit (INDEPENDENT_AMBULATORY_CARE_PROVIDER_SITE_OTHER): Payer: Medicare Other | Admitting: Family Medicine

## 2014-05-02 VITALS — BP 156/78 | HR 74 | Temp 98.1°F | Ht 66.0 in | Wt 149.5 lb

## 2014-05-02 DIAGNOSIS — M7989 Other specified soft tissue disorders: Secondary | ICD-10-CM | POA: Diagnosis not present

## 2014-05-02 MED ORDER — TRAMADOL HCL 50 MG PO TABS: ORAL_TABLET | ORAL | Status: DC

## 2014-05-02 NOTE — Patient Instructions (Signed)
I think that your leg pain is being caused by varicose veins. You can continue using tramadol as needed to ease the pain. If it is still bothering you at your next appointment we can discuss a referral to a vein specialist.

## 2014-05-10 NOTE — Assessment & Plan Note (Signed)
Stable edema likely venous insufficiency +/- chronic prednisone, well controlled with diet and compression/elevation. Small area on posterior left ankle does not improve and tender - no evidence of clot, no asymetric edema on exam - tenderness on posterior ankle likely related to varicose veins - continue elevation/compression and low salt diet, prn tramadol - return precautions discussed

## 2014-05-10 NOTE — Progress Notes (Signed)
   Subjective:    Patient ID: Chloe Lopez, female    DOB: [birth year], 81 y.o.   MRN: [id number]  HPI Pt presents for f/u of leg swelling and concern about tender area on ankle. She reports she has edema of both legs but she controls it with a low salt diet, compression stockings and elevation. There is an area on the back of her left ankle that is always swollen despite these measures and gets sore, especially when she is elevating her legs.   Review of Systems No fever, CP, SOB, palpitations    Objective:   Physical Exam  Constitutional: She is oriented to person, place, and time. She appears well-developed and well-nourished. No distress.  HENT:  Head: Normocephalic and atraumatic.  Eyes: Conjunctivae are normal. Right eye exhibits no discharge. Left eye exhibits no discharge. No scleral icterus.  Cardiovascular: Normal rate, regular rhythm, normal heart sounds and intact distal pulses.  Exam reveals no gallop and no friction rub.   No murmur heard. Pulmonary/Chest: Effort normal and breath sounds normal. No respiratory distress. She has no wheezes.  Abdominal: She exhibits no distension.  Musculoskeletal: She exhibits edema.  Trace edema of bilateral lower extremities and tenderness of posterior left ankle, mild varicose veins evident in this area  Neurological: She is alert and oriented to person, place, and time.  Skin: Skin is warm and dry. She is not diaphoretic. No erythema.  Psychiatric: She has a normal mood and affect. Her behavior is normal.  Nursing note and vitals reviewed.         Assessment & Plan:

## 2014-09-10 ENCOUNTER — Encounter: Payer: Self-pay | Admitting: Family Medicine

## 2014-09-10 ENCOUNTER — Ambulatory Visit (INDEPENDENT_AMBULATORY_CARE_PROVIDER_SITE_OTHER): Payer: Medicare Other | Admitting: Family Medicine

## 2014-09-10 VITALS — BP 120/87 | HR 62 | Temp 98.3°F | Ht 66.0 in | Wt 151.0 lb

## 2014-09-10 DIAGNOSIS — G629 Polyneuropathy, unspecified: Secondary | ICD-10-CM | POA: Diagnosis not present

## 2014-09-10 DIAGNOSIS — R1314 Dysphagia, pharyngoesophageal phase: Secondary | ICD-10-CM | POA: Diagnosis not present

## 2014-09-10 DIAGNOSIS — I872 Venous insufficiency (chronic) (peripheral): Secondary | ICD-10-CM | POA: Diagnosis present

## 2014-09-10 DIAGNOSIS — M7989 Other specified soft tissue disorders: Secondary | ICD-10-CM | POA: Diagnosis not present

## 2014-09-10 MED ORDER — TRAMADOL HCL 50 MG PO TABS: ORAL_TABLET | ORAL | Status: DC

## 2014-09-10 MED ORDER — GABAPENTIN 100 MG PO CAPS: 100.0000 mg | ORAL_CAPSULE | Freq: Every day | ORAL | Status: DC

## 2014-09-10 NOTE — Assessment & Plan Note (Addendum)
May be related to postnasal drip or GERD Patient has history of smoking, so ENT laryngeal exam warranted Referral to ENT Consider speech therapy consult in the future for modified barium swallow study

## 2014-09-10 NOTE — Progress Notes (Signed)
   Subjective:   Chloe Lopez is a 81 y.o. female with a history of chronic venous stasis, angioedema, dysphagia here for b/l leg pain and swelling and trouble swallowing.  B/l leg swelling - really painful to touch - cant even wipe when showering - worse at night - loses sleep because of it - burning sensation but not numb - compression stockings don't seem to help and very difficult to get on and off - taking tramadol to help her sleep - elevation seems to help - swelling is about the same, pain is worsening  Trouble swallowing - feels like food and meds are getting stuck - nonpainful - h/o angioedema - but this seems different - no SOB or difficulty breathing - present for 2-3 weeks - staying the same, not worsening - denies fever, wt loss, night sweats, postnasal drip, nasal congestion - voice more hoarse recently too - takes ranitidine and prednisone for angioedema - so patient doubts GERD/allergies  Review of Systems:  Per HPI. All other systems reviewed and are negative.   PMH, PSH, Medications, Allergies, and FmHx reviewed and updated in EMR.  Social History: former smoker - quit many years ago  Objective:  BP 120/87 mmHg  Pulse 62  Temp(Src) 98.3 F (36.8 C) (Oral)  Ht 5\' 6"  (1.676 m)  Wt 151 lb (68.493 kg)  BMI 24.38 kg/m2  Gen:  81 y.o. female in NAD HEENT: NCAT, MMM, EOMI, PERRL, anicteric sclerae, OP clear, nares patent CV: RRR, no MRG Resp: Non-labored, CTAB, no wheezes noted Ext: WWP, chronic venous stasis skin changes, diffusely TTP, Homans negative, varicose veins present Neuro: Alert and oriented, speech normal     Assessment:     Chloe Lopez is a 81 y.o. female here for LE venous stasis and pain, dysphagia    Plan:     See problem list for problem-specific plans.   Chloe Crews, MD PGY-1,  [facility] 09/10/2014  11:20 AM

## 2014-09-10 NOTE — Assessment & Plan Note (Signed)
Stable venous insufficiency. No concern for cardiac/renal/hepatic etiology. Chronic prednisone use may be contributing to swelling - Counseled on daily compression stocking use, the patient has had little success with this in the future - Continue to elevate legs as tolerated

## 2014-09-10 NOTE — Assessment & Plan Note (Addendum)
Suspect burning sensation in bilateral feet at night is related to peripheral neuropathy Start gabapentin 100 mg daily at bedtime and increase by 100 mg weekly Patient to call clinic when she is at 300 mg daily at bedtime Consider adding daytime dose of gabapentin if patient tolerating okay Continue tramadol 50 mg every 8 hours when necessary for pain Follow-up when she returns to town

## 2014-09-10 NOTE — Patient Instructions (Addendum)
Nice to meet you today.  For your trouble swallowing, I will refer you to ENT. We'll try to get you this appointment as soon as possible.  For your pain in your feet, I think that this is neuropathy or nerve pain. Start taking gabapentin 100 mg daily at bedtime. After one week if this is not helping, you can increase to 200 mg daily at bedtime. After another week if this is not helping, increased to 300 mg daily at bedtime. Please call the clinic to discuss with [place] if this is not helping at that time.  Take care, Dr. Jacinto Reap

## 2014-09-11 DIAGNOSIS — L821 Other seborrheic keratosis: Secondary | ICD-10-CM | POA: Diagnosis not present

## 2014-09-11 DIAGNOSIS — I788 Other diseases of capillaries: Secondary | ICD-10-CM | POA: Diagnosis not present

## 2014-09-11 DIAGNOSIS — L905 Scar conditions and fibrosis of skin: Secondary | ICD-10-CM | POA: Diagnosis not present

## 2014-09-11 DIAGNOSIS — Z961 Presence of intraocular lens: Secondary | ICD-10-CM | POA: Diagnosis not present

## 2014-09-11 DIAGNOSIS — H3530 Unspecified macular degeneration: Secondary | ICD-10-CM | POA: Diagnosis not present

## 2014-09-11 DIAGNOSIS — L509 Urticaria, unspecified: Secondary | ICD-10-CM | POA: Diagnosis not present

## 2014-12-06 ENCOUNTER — Telehealth: Payer: Self-pay | Admitting: Family Medicine

## 2014-12-06 NOTE — Telephone Encounter (Signed)
Ms. Langenfeld is coming on 12/13/14, and wants it to be noted that she would like to be seen by a leg specialist between the dates of 12/13/14-12/19/14. Thank you, Fonda Kinder, ASA

## 2014-12-06 NOTE — Telephone Encounter (Signed)
Will discuss at office visit on 8/11.

## 2014-12-13 ENCOUNTER — Ambulatory Visit (HOSPITAL_COMMUNITY)
Admission: RE | Admit: 2014-12-13 | Discharge: 2014-12-13 | Disposition: A | Discharge status: Home / Self Care | Payer: Medicare Other | Source: Ambulatory Visit | Attending: Family Medicine | Admitting: Family Medicine

## 2014-12-13 ENCOUNTER — Encounter: Payer: Self-pay | Admitting: Family Medicine

## 2014-12-13 ENCOUNTER — Ambulatory Visit (INDEPENDENT_AMBULATORY_CARE_PROVIDER_SITE_OTHER): Payer: Medicare Other | Admitting: Family Medicine

## 2014-12-13 VITALS — BP 146/84 | HR 54 | Temp 98.1°F | Ht 67.0 in | Wt 149.7 lb

## 2014-12-13 DIAGNOSIS — G629 Polyneuropathy, unspecified: Secondary | ICD-10-CM

## 2014-12-13 DIAGNOSIS — I499 Cardiac arrhythmia, unspecified: Secondary | ICD-10-CM | POA: Diagnosis not present

## 2014-12-13 DIAGNOSIS — R238 Other skin changes: Secondary | ICD-10-CM | POA: Diagnosis not present

## 2014-12-13 DIAGNOSIS — R9431 Abnormal electrocardiogram [ECG] [EKG]: Secondary | ICD-10-CM | POA: Diagnosis not present

## 2014-12-13 DIAGNOSIS — I872 Venous insufficiency (chronic) (peripheral): Secondary | ICD-10-CM

## 2014-12-13 DIAGNOSIS — R233 Spontaneous ecchymoses: Secondary | ICD-10-CM

## 2014-12-13 DIAGNOSIS — R008 Other abnormalities of heart beat: Secondary | ICD-10-CM | POA: Diagnosis not present

## 2014-12-13 MED ORDER — TRAMADOL HCL 50 MG PO TABS: ORAL_TABLET | ORAL | Status: DC

## 2014-12-13 NOTE — Patient Instructions (Signed)
Try the gabapentin. 100mg  just at night. Use caution as it might make you sleepy Refilled tramadol  Try going off the aspirin to see if you bruise less. If still having issues with veins, call us a month or two ahead of your return and we can try to schedule you with a vein specialist.  Be well, Dr. Ardelia Mems

## 2014-12-13 NOTE — Progress Notes (Signed)
Patient ID: Chloe Lopez, female   DOB: 1923/12/23, 81 y.o.   MRN: [id number]  HPI:  Pt presents to discuss vein issues.  Reports issues with her veins for a few months. Living at [facility] with one of her children but is planning to return to [place] after Thanksgiving.  About 1 month ago she woke up with painful burning in one spot on her shin. She took a tramadol for pain relief. The next morning the area was red/purple. By that night, her foot was black with bruising. It has since returned to normal color. She tried to see a vein specialist at the [facility] but states there are no vein specialists there to be seen. Has not worn compression hose recently as her swelling has been relatively well controlled. Does note easy bruising in general. No hx of bleeding disorders. No bleeding of gums with brushing teeth.   Also has questions about gabapentin rx - was given this by Dr. Brita Romp during prior appointment for neuropathy of legs. Patient saw side effect profile and was too afraid to take it, so she hasn't done that. Does request refill of tramadol.  ROS: See HPI.  [place]: hx hives (on doxepin), osteopenia, venous insufficiency, peripheral neuropathy  PHYSICAL EXAM: BP 146/84 mmHg  Pulse 54  Temp(Src) 98.1 F (36.7 C) (Oral)  Ht 5\' 7"  (1.702 m)  Wt 149 lb 11.2 oz (67.903 kg)  BMI 23.44 kg/m2 Gen: NAD, pleasant, cooperative HEENT: NCAT Heart: slightly irregular rhythm, no murmurs.  Lungs: CTAB, NWOB Neuro: grossly nonfocal ,speech normal Ext: legs nontender to palpation. Mild lower ext edema bilaterally. No skin discoloration. neurovasuclarly intact to toes. 2+ DP pulses bilat.   ASSESSMENT/PLAN:  Peripheral neuropathy Reassured patient about gabapentin side effects. Discussed possible sedation as side effect. Recommended trying 100mg  at night, and not to take along with tramadol to reduce risk of excessive sedation. Pt appreciative of advice and will try  gabapentin.  Easy bruising Pt reporting easy bruising but no abnormal bleeding (other than subcutaneous bleed about a month ago). Discussed with pt that we could pursue additional workup including labs, but that I favor stopping her aspirin first to see if bruising improves. She has no absolute indication for a statin (no known CAD, CVA, etc). She will f/u with me in 3 months, or sooner if any worsening bruising or other issues. If continues to have problems with her veins will be happy to refer her to a vein specialist when she moves back to [place].  Irregular heartbeat - noted on exam. Pt denies any chest pain, SOB, palpitations, syncope. EKG performed and shows sinus rhythm with occasional PVC. No further workup needed now.  FOLLOW UP: F/u in 3 mos for above issues  [place] J. Ardelia Mems, [place]

## 2014-12-14 DIAGNOSIS — R238 Other skin changes: Secondary | ICD-10-CM | POA: Insufficient documentation

## 2014-12-14 DIAGNOSIS — R233 Spontaneous ecchymoses: Secondary | ICD-10-CM | POA: Insufficient documentation

## 2014-12-14 NOTE — Assessment & Plan Note (Signed)
Reassured patient about gabapentin side effects. Discussed possible sedation as side effect. Recommended trying 100mg  at night, and not to take along with tramadol to reduce risk of excessive sedation. Pt appreciative of advice and will try gabapentin.

## 2014-12-14 NOTE — Assessment & Plan Note (Signed)
Pt reporting easy bruising but no abnormal bleeding (other than subcutaneous bleed about a month ago). Discussed with pt that we could pursue additional workup including labs, but that I favor stopping her aspirin first to see if bruising improves. She has no absolute indication for a statin (no known CAD, CVA, etc). She will f/u with me in 3 months, or sooner if any worsening bruising or other issues. If continues to have problems with her veins will be happy to refer her to a vein specialist when she moves back to [place].

## 2014-12-18 DIAGNOSIS — T783XXA Angioneurotic edema, initial encounter: Secondary | ICD-10-CM | POA: Diagnosis not present

## 2014-12-18 DIAGNOSIS — L500 Allergic urticaria: Secondary | ICD-10-CM | POA: Diagnosis not present

## 2015-02-14 ENCOUNTER — Telehealth: Payer: Self-pay | Admitting: Family Medicine

## 2015-02-14 DIAGNOSIS — I872 Venous insufficiency (chronic) (peripheral): Secondary | ICD-10-CM

## 2015-02-14 NOTE — Telephone Encounter (Signed)
Pt called because her son-in law mis-placed her written prescription for Tramadol. [place] and it [place] in the house there or maybe even here. She is hoping that the doctor can call the pharmacy here and call a refill in. She is in the process of packing and will be back in [facility] tomorrow. Please call patient when she does so that when she gets into town tomorrow she can go by the pharmacy and pick this up. Chloe Lopez

## 2015-02-14 NOTE — Telephone Encounter (Signed)
Last rx for tramadol given on 8/11 by PCP, contact patient pharmacy and the last rx they have filled for patient was on 8/16 for #60 with no refills under Dr. Brita Romp. Will forward to PCP.

## 2015-02-15 MED ORDER — TRAMADOL HCL 50 MG PO TABS: ORAL_TABLET | ORAL | Status: DC

## 2015-02-15 NOTE — Telephone Encounter (Signed)
[place] Controlled Substance Database reviewed, findings are appropriate. Patient has given no signs of misuse in the past. Called rx into pharmacy, #60 with 3 refills. Called patient to inform her of this. Patient appreciative. States she has moved back to [place] and is planning to follow up with me in the next 1-2 weeks.  Leeanne Rio, MD

## 2015-02-26 ENCOUNTER — Telehealth: Payer: Self-pay | Admitting: Family Medicine

## 2015-02-26 ENCOUNTER — Telehealth: Payer: Self-pay | Admitting: Allergy and Immunology

## 2015-02-26 NOTE — Telephone Encounter (Signed)
REBECCA  Bill DAUGHTER CALLED AND WANTED TO TALK WITH YOU ABOUT HER MOTHER BEEN ON TRAMADO, HAS SOME QUESTION. CALL ON [phone] OR [phone] .

## 2015-02-26 NOTE — Telephone Encounter (Signed)
Spoke with patient daughter, she relays concerns about her mother being dependent on tramadol and would like to discuss possibly getting her off this medication. States mother has a history of alcohol addiction and gets very upset and nervous when daughter brought up the subject of her possibly being dependent on this medication. States she also did some research and found that taking the tramadol and doxepin together like her mother does could have some very serious side effects. Informed patient daughter that PCP was out of the office this week but I would leave her a message to call when she returns. Daughter expressed understanding, says MD can best reach her at [phone].

## 2015-02-26 NOTE — Telephone Encounter (Signed)
Spoke with daughter advised we did not prescribed Tramadol this is a pain medication. Daughter is concerned because patient has a history of addiction and is showing symptoms. Her pcp is looking in taking her off this medication also might need to come off Doxepin advised to call back in regards to this we would need to consult Dr Neldon Mc on how to take her off. Daughter will call back to update and get advised on how to take mother off Doxepin.

## 2015-02-26 NOTE — Telephone Encounter (Signed)
Left message to return call 

## 2015-02-26 NOTE — Telephone Encounter (Signed)
Daughter is calling and would like to speak to Dr. Ardelia Mems about some medication that her mother is on. Since Dr. Mingo Amber is covering for Dr. Ardelia Mems I am sending this to both teams. jw

## 2015-02-27 NOTE — Telephone Encounter (Signed)
Agree this seems to be chronic issue and handled by PCP.  If concerns before she returns, please let me know.

## 2015-03-04 NOTE — Telephone Encounter (Signed)
I called and spoke with patient to get her permission to call her daughter and speak about her medicines. Patient granted permission for me to call daughter Wells Guiles and discuss her concerns.  I called Wells Guiles but there was no answer. Did not leave voicemail. Will attempt to reach Wells Guiles another time.  Leeanne Rio, MD

## 2015-03-08 NOTE — Telephone Encounter (Signed)
Called and was able to speak with daughter Chloe Lopez.  She has apparently done research and listened to a program on tramadol describing its addictive potential and is concerned about this in her mother. States her mom gets very anxious and upset when she doesn't have tramadol on hand. Notes a history of alcohol abuse on a longstanding basis, ending about 5 years ago.(The history of alcohol dependency is a sensitive topic with patient and Chloe Lopez would prefer me not bring it up with patient since she shared it in confidence.)  Encouraged Becky to bring her mother in for an appointment where the 3 of [place] can discuss her medications and these concerns in more detail. Chloe Lopez stated they would call & schedule a visit.  Leeanne Rio, MD

## 2015-03-25 DIAGNOSIS — H40013 Open angle with borderline findings, low risk, bilateral: Secondary | ICD-10-CM | POA: Insufficient documentation

## 2015-03-25 DIAGNOSIS — H3530 Unspecified macular degeneration: Secondary | ICD-10-CM | POA: Diagnosis not present

## 2015-04-04 DIAGNOSIS — Z23 Encounter for immunization: Secondary | ICD-10-CM | POA: Diagnosis not present

## 2015-04-23 ENCOUNTER — Ambulatory Visit (INDEPENDENT_AMBULATORY_CARE_PROVIDER_SITE_OTHER): Payer: Medicare Other | Admitting: Allergy and Immunology

## 2015-04-23 ENCOUNTER — Encounter: Payer: Self-pay | Admitting: Allergy and Immunology

## 2015-04-23 VITALS — BP 122/80 | HR 96 | Resp 20

## 2015-04-23 DIAGNOSIS — L501 Idiopathic urticaria: Secondary | ICD-10-CM | POA: Diagnosis not present

## 2015-04-23 MED ORDER — DOXEPIN HCL 10 MG PO CAPS: 30.0000 mg | ORAL_CAPSULE | Freq: Every day | ORAL | Status: DC

## 2015-04-23 MED ORDER — METHYLPREDNISOLONE ACETATE 80 MG/ML IJ SUSP
80.0000 mg | Freq: Once | INTRAMUSCULAR | Status: AC
  Administered 2015-04-23: 80 mg via INTRAMUSCULAR

## 2015-04-23 MED ORDER — RANITIDINE HCL 300 MG PO CAPS: 300.0000 mg | ORAL_CAPSULE | Freq: Two times a day (BID) | ORAL | Status: DC

## 2015-04-23 NOTE — Progress Notes (Signed)
[facility]  Follow-up Note  Refering Provider: Leeanne Rio, MD Primary Provider: Chrisandra Netters, MD  Subjective:   Chloe Lopez is a 81 y.o. female who returns to the [facility] in re-evaluation of the following:  HPI Comments:  Chloe Lopez presents this clinic on 04/23/2015 in reevaluation of her autoimmune urticaria and angioedema treated with chronic systemic steroid use. I last saw her in this clinic about 6 months ago and during the interval she has been consistently using prednisone 2.5 mg as well as doxepin and ranitidine with very good success regarding control of her urticaria and angioedema. Unfortunately, about 10 days or so ago she started to develop increasing patches of redness and some swelling affecting her face and neck without any other associated systemic or constitutional symptoms. There were no obvious provoking factors giving rise to this increased activity however, she was drinking a protein shake called [place], which she started around the same time that her skin activity started.   Current Outpatient Prescriptions on File Prior to Visit  Medication Sig Dispense Refill  . predniSONE (DELTASONE) 5 MG tablet Take 5 mg by mouth daily.     . Ranitidine HCl (ZANTAC PO) Take 150 mg by mouth. 300mg  tabs twice a day    . [DISCONTINUED] DOXEPIN HCL PO Take by mouth. 30 mg  Twice a day     No current facility-administered medications on file prior to visit.    Meds ordered this encounter  Medications  . methylPREDNISolone acetate (DEPO-MEDROL) injection 80 mg    Sig:   . ranitidine (ZANTAC) 300 MG capsule    Sig: Take 1 capsule (300 mg total) by mouth 2 (two) times daily.    Dispense:  180 capsule    Refill:  3  . doxepin (SINEQUAN) 10 MG capsule    Sig: Take 3 capsules (30 mg total) by mouth at bedtime.    Dispense:  270 capsule    Refill:  3    Past Medical History  Diagnosis  Date  . Depression   . Sciatica 08/21/2011    Started recently in left hip. Takes tramadol. Does not want surgery. Happens once a week or so.      Past Surgical History  Procedure Laterality Date  . Mastectomy, partial  2009    No Known Allergies  Review of Systems  Constitutional: Negative.   HENT: Negative.   Eyes: Negative.   Respiratory: Negative.   Cardiovascular: Negative.   Gastrointestinal: Negative.   Musculoskeletal: Negative.   Skin: Positive for rash.  Allergic/Immunologic: Negative.   Neurological: Negative.   Hematological: Negative.      Objective:   Filed Vitals:   04/23/15 1014  BP: 122/80  Pulse: 96  Resp: 20          Physical Exam  Constitutional: She appears well-developed and well-nourished. No distress.  HENT:  Head: Normocephalic and atraumatic. Head is without right periorbital erythema and without left periorbital erythema.  Right Ear: Tympanic membrane, external ear and ear canal normal. No drainage or tenderness. No foreign bodies. Tympanic membrane is not injected, not scarred, not perforated, not erythematous, not retracted and not bulging. No middle ear effusion.  Left Ear: Tympanic membrane, external ear and ear canal normal. No drainage or tenderness. No foreign bodies. Tympanic membrane is not injected, not scarred, not perforated, not erythematous, not retracted and not bulging.  No middle ear effusion.  Nose: Nose normal.  No mucosal edema, rhinorrhea, nose lacerations or sinus tenderness.  No foreign bodies.  Mouth/Throat: Oropharynx is clear and moist. No oropharyngeal exudate, posterior oropharyngeal edema, posterior oropharyngeal erythema or tonsillar abscesses.  Eyes: Lids are normal. Right eye exhibits no chemosis, no discharge and no exudate. No foreign body present in the right eye. Left eye exhibits no chemosis, no discharge and no exudate. No foreign body present in the left eye. Right conjunctiva is not injected. Left  conjunctiva is not injected.  Neck: Neck supple. No tracheal tenderness present. No tracheal deviation and no edema present. No thyroid mass and no thyromegaly present.  Cardiovascular: Normal rate, regular rhythm, S1 normal and S2 normal.  Exam reveals no gallop.   No murmur heard. Pulmonary/Chest: No accessory muscle usage or stridor. No respiratory distress. She has no wheezes. She has no rhonchi. She has no rales.  Abdominal: Soft.  Lymphadenopathy:       Head (right side): No tonsillar adenopathy present.       Head (left side): No tonsillar adenopathy present.    She has no cervical adenopathy.  Neurological: She is alert.  Skin: Rash noted. She is not diaphoretic.  Erythematous patches in periorbital and perioral distribution and involvement of neck without any obvious angioedema  Psychiatric: She has a normal mood and affect. Her behavior is normal.    Diagnostics: None  Assessment and Plan:   1. Idiopathic urticaria     1. Depo-Medrol 80 mg IM now  2. Prednisone 10 mg daily for 1 week, then 5 mg daily   3. Continue doxepin 30 mg in the evening and ranitidine 300 mg twice a day  4. Can add Benadryl if needed  5. EpiPen if needed  6. Stop protein shake consumption  7. Further evaluation?  8. Return in 4 weeks or earlier if problem  I will assume that Chloe Lopez had immunological reactivity because of her protein shake consumption as this was the only new environmental factor change that may be responsible for this activity. I treated her with a little higher dose of systemic steroids and will slowly taper her down to 5 mg daily and see her back in this clinic in 4 weeks and if she has good stability of her problem we will attempt once again to get her down to 2.5 mg of prednisone daily. She will contact me during the interval should there be a significant problem.   Allena Katz, MD [place]

## 2015-04-23 NOTE — Patient Instructions (Addendum)
  1. Depo-Medrol 80 mg IM now  2. Prednisone 10 mg daily for 1 week, then 5 mg daily   3. Continue doxepin 30 mg in the evening and ranitidine 300 mg twice a day  4. Can add Benadryl if needed  5. EpiPen if needed  6. Stop protein shake consumption  7. Further evaluation?  8. Return in 4 weeks or earlier if problem

## 2015-04-24 ENCOUNTER — Telehealth: Payer: Self-pay | Admitting: Allergy and Immunology

## 2015-04-24 NOTE — Telephone Encounter (Signed)
Patient notified and she will follow up about any response.

## 2015-04-24 NOTE — Telephone Encounter (Signed)
Pt was in yesterday, 04/23/15. She received a shot. Today she woke up and the side of her face is swollen pls advise  She has called twice about this within 79min

## 2015-04-24 NOTE — Telephone Encounter (Signed)
Please inform patient to add cetirizine 10mg  tablet every morning (now) and can add benadryl if needed. Keep in contact with [place] about response.

## 2015-04-24 NOTE — Telephone Encounter (Signed)
Dr. Kozlow, Please advise.  

## 2015-05-13 ENCOUNTER — Other Ambulatory Visit: Payer: Self-pay | Admitting: Allergy and Immunology

## 2015-06-18 ENCOUNTER — Ambulatory Visit (INDEPENDENT_AMBULATORY_CARE_PROVIDER_SITE_OTHER): Payer: Medicare Other | Admitting: Allergy and Immunology

## 2015-06-18 ENCOUNTER — Encounter: Payer: Self-pay | Admitting: Allergy and Immunology

## 2015-06-18 VITALS — BP 122/78 | HR 80 | Resp 20

## 2015-06-18 DIAGNOSIS — E069 Thyroiditis, unspecified: Secondary | ICD-10-CM

## 2015-06-18 DIAGNOSIS — L501 Idiopathic urticaria: Secondary | ICD-10-CM

## 2015-06-18 DIAGNOSIS — R5382 Chronic fatigue, unspecified: Secondary | ICD-10-CM

## 2015-06-18 NOTE — Progress Notes (Signed)
Follow-up Note  Referring Provider: Leeanne Rio, MD Primary Provider: Chrisandra Netters, MD Date of Office Visit: 06/18/2015  Subjective:   Chloe Lopez (DOB: 04/28/1924) is a 82 y.o. female who returns to the [facility] [place] on 06/18/2015 in re-evaluation of the following:  HPI Comments: Chloe Lopez returns to this clinic in evaluation of her urticaria. I last saw her in this clinic on 04/23/2015 at which time we gave her an injection of Depo-Medrol and had her continue her daily low-dose steroids as well as doxepin and nicotine. She's continued to have recurrent episodes of urticaria at least 50% of the days. These lesions are extremely itchy and sometimes disturb her sleep. They never heal with scar or hyperpigmentation. Once again she cannot identify the trigger giving rise to this overactivity best been present for the past 2 months or so.   Current Outpatient Prescriptions on File Prior to Visit  Medication Sig Dispense Refill  . doxepin (SINEQUAN) 10 MG capsule Take 3 capsules (30 mg total) by mouth at bedtime. 270 capsule 3  . predniSONE (DELTASONE) 5 MG tablet TAKE 1 TABLET BY MOUTH DAILY 90 tablet 1  . ranitidine (ZANTAC) 300 MG capsule Take 1 capsule (300 mg total) by mouth 2 (two) times daily. 180 capsule 3  . [DISCONTINUED] DOXEPIN HCL PO Take by mouth. 30 mg  Twice a day     No current facility-administered medications on file prior to visit.    Past Medical History  Diagnosis Date  . Depression   . Sciatica 08/21/2011    Started recently in left hip. Takes tramadol. Does not want surgery. Happens once a week or so.      Past Surgical History  Procedure Laterality Date  . Mastectomy, partial  2009    No Known Allergies  Review of systems negative except as noted in HPI / PMHx or noted below:  Review of Systems  Constitutional: Negative.   HENT: Negative.   Eyes: Negative.   Respiratory: Negative.   Cardiovascular: Negative.     Gastrointestinal: Negative.   Genitourinary: Negative.   Musculoskeletal: Negative.   Skin: Negative.   Neurological: Negative.   Endo/Heme/Allergies: Negative.   Psychiatric/Behavioral: Negative.      Objective:   Filed Vitals:   06/18/15 1108  BP: 122/78  Pulse: 80  Resp: 20          Physical Exam  Constitutional: She is well-developed, well-nourished, and in no distress.  HENT:  Head: Normocephalic.  Right Ear: Tympanic membrane, external ear and ear canal normal.  Left Ear: Tympanic membrane, external ear and ear canal normal.  Nose: Nose normal. No mucosal edema or rhinorrhea.  Mouth/Throat: Uvula is midline, oropharynx is clear and moist and mucous membranes are normal. No oropharyngeal exudate.  Eyes: Conjunctivae are normal.  Neck: Trachea normal. No tracheal tenderness present. No tracheal deviation present. No thyromegaly present.  Cardiovascular: Normal rate, regular rhythm, S1 normal, S2 normal and normal heart sounds.   No murmur heard. Pulmonary/Chest: Breath sounds normal. No stridor. No respiratory distress. She has no wheezes. She has no rales.  Musculoskeletal: She exhibits no edema.  Lymphadenopathy:       Head (right side): No tonsillar adenopathy present.       Head (left side): No tonsillar adenopathy present.    She has no cervical adenopathy.    She has no axillary adenopathy.  Neurological: She is alert. Gait normal.  Skin: Rash (blanching urticarial lesions posterior neck) noted.  She is not diaphoretic. No erythema. Nails show no clubbing.  Psychiatric: Mood and affect normal.    Diagnostics: None  Assessment and Plan:   1. Idiopathic urticaria     1. Prednisone 10 mg daily     2. Continue doxepin 30 mg in the evening and ranitidine 300 mg twice a day  3. Can add Benadryl if needed  4. EpiPen if needed  5. Blood - CBC with differential, CMP, TSH, T4, alpha gal  6. Submit for Xolair administration   7. Return in 4 weeks or  earlier if problem  I will have Chloe Lopez utilize a little bit higher dose of prednisone on a regular basis. She will remain on 10 mg daily. I've encouraged her once again to consider administration of Xolair given the fact that her other option is to remain on prednisone for a prolonged period in time. To be complete we will have her undergo a few blood tests to look for a systemic disease contributing to her immunologic hyperreactivity which appears to have flared over the course of the past month or so. I will contact her with the results of these blood tests once they're available for review. I'll see her back in this clinic in 4 weeks.  Chloe Katz, MD [place]

## 2015-06-18 NOTE — Patient Instructions (Signed)
  1. Prednisone 10 mg daily     2. Continue doxepin 30 mg in the evening and ranitidine 300 mg twice a day  3. Can add Benadryl if needed  4. EpiPen if needed  5. Blood - CBC with differential, CMP, TSH, T4, alpha gal  6. Submit for Xolair administration   7. Return in 4 weeks or earlier if problem

## 2015-06-19 DIAGNOSIS — L501 Idiopathic urticaria: Secondary | ICD-10-CM | POA: Diagnosis not present

## 2015-06-19 DIAGNOSIS — E069 Thyroiditis, unspecified: Secondary | ICD-10-CM | POA: Diagnosis not present

## 2015-06-19 DIAGNOSIS — R5382 Chronic fatigue, unspecified: Secondary | ICD-10-CM | POA: Diagnosis not present

## 2015-06-22 LAB — COMPREHENSIVE METABOLIC PANEL
ALBUMIN: 4.1 g/dL (ref 3.2–4.6)
ALT: 9 IU/L (ref 0–32)
AST: 15 IU/L (ref 0–40)
Albumin/Globulin Ratio: 1.6 (ref 1.1–2.5)
Alkaline Phosphatase: 73 IU/L (ref 39–117)
BUN / CREAT RATIO: 17 (ref 11–26)
BUN: 12 mg/dL (ref 10–36)
Bilirubin Total: <0.2 mg/dL (ref 0.0–1.2)
CALCIUM: 10 mg/dL (ref 8.7–10.3)
CO2: 25 mmol/L (ref 18–29)
CREATININE: 0.72 mg/dL (ref 0.57–1.00)
Chloride: 101 mmol/L (ref 96–106)
GFR calc Af Amer: 85 mL/min/{1.73_m2} (ref >59)
GFR calc non Af Amer: 74 mL/min/{1.73_m2} (ref >59)
Globulin, Total: 2.5 g/dL (ref 1.5–4.5)
Glucose: 105 mg/dL — ABNORMAL HIGH (ref 65–99)
Potassium: 4.4 mmol/L (ref 3.5–5.2)
Sodium: 142 mmol/L (ref 134–144)
Total Protein: 6.6 g/dL (ref 6.0–8.5)

## 2015-06-22 LAB — CBC WITH DIFFERENTIAL
BASOS: 0 %
Basophils Absolute: 0 10*3/uL (ref 0.0–0.2)
EOS (ABSOLUTE): 0 10*3/uL (ref 0.0–0.4)
Eos: 0 %
Hematocrit: 43.9 % (ref 34.0–46.6)
Hemoglobin: 14.8 g/dL (ref 11.1–15.9)
Immature Grans (Abs): 0 10*3/uL (ref 0.0–0.1)
Immature Granulocytes: 0 %
Lymphocytes Absolute: 2.2 10*3/uL (ref 0.7–3.1)
Lymphs: 32 %
MCH: 31.5 pg (ref 26.6–33.0)
MCHC: 33.7 g/dL (ref 31.5–35.7)
MCV: 93 fL (ref 79–97)
MONOCYTES: 5 %
MONOS ABS: 0.4 10*3/uL (ref 0.1–0.9)
NEUTROS ABS: 4.4 10*3/uL (ref 1.4–7.0)
Neutrophils: 63 %
RBC: 4.7 x10E6/uL (ref 3.77–5.28)
RDW: 14.2 % (ref 12.3–15.4)
WBC: 7 10*3/uL (ref 3.4–10.8)

## 2015-06-22 LAB — TSH: TSH: 1.7 u[IU]/mL (ref 0.450–4.500)

## 2015-06-22 LAB — T4, FREE: Free T4: 1.24 ng/dL (ref 0.82–1.77)

## 2015-06-22 LAB — ALPHA-GAL PANEL
Alpha Gal IgE*: <0.1 kU/L (ref <0.35)
BEEF CLASS INTERPRETATION: 0
Class Interpretation: 0
LAMB CLASS INTERPRETATION: 0
Lamb/Mutton (Ovis spp) IgE: <0.1 kU/L (ref <0.35)

## 2015-06-25 ENCOUNTER — Telehealth: Payer: Self-pay | Admitting: *Deleted

## 2015-06-25 NOTE — Telephone Encounter (Signed)
Called patient to offer flu vaccine, however, daughter, Dondra Spry [phone]) VM picked up.  Left message on daughter's voice mail to return call. Velora Heckler, RN

## 2015-07-12 ENCOUNTER — Ambulatory Visit (INDEPENDENT_AMBULATORY_CARE_PROVIDER_SITE_OTHER): Payer: Medicare Other | Admitting: Family Medicine

## 2015-07-12 ENCOUNTER — Ambulatory Visit (HOSPITAL_COMMUNITY)
Admission: RE | Admit: 2015-07-12 | Discharge: 2015-07-12 | Disposition: A | Discharge status: Home / Self Care | Payer: Medicare Other | Source: Ambulatory Visit | Attending: Cardiovascular Disease | Admitting: Cardiovascular Disease

## 2015-07-12 ENCOUNTER — Telehealth: Payer: Self-pay | Admitting: *Deleted

## 2015-07-12 ENCOUNTER — Encounter: Payer: Self-pay | Admitting: Family Medicine

## 2015-07-12 VITALS — BP 153/84 | HR 88 | Temp 97.8°F | Wt 149.0 lb

## 2015-07-12 DIAGNOSIS — M7989 Other specified soft tissue disorders: Secondary | ICD-10-CM

## 2015-07-12 DIAGNOSIS — F341 Dysthymic disorder: Secondary | ICD-10-CM | POA: Diagnosis not present

## 2015-07-12 DIAGNOSIS — I872 Venous insufficiency (chronic) (peripheral): Secondary | ICD-10-CM | POA: Diagnosis not present

## 2015-07-12 NOTE — Telephone Encounter (Signed)
[facility] lab calls stating that patient was negative for DVT, they will be sending patient home. FYI to PCP.

## 2015-07-12 NOTE — Progress Notes (Signed)
Date of Visit: 07/12/2015   HPI:  Patient presents today for follow up. She has many concerns to discuss:  - leg swelling: has had increased swelling in bilateral legs for 4 days. Were red the other day. Redness better today. No recent travel. No chest pain or shortness of breath. Wants to see vein specialist. Feels pain in legs about 1-2 hours after going to bed. Tried gabapentin. Only takes tramadol as needed for pain in legs. Reports doing this about 1-2 times a week on average. Denies any unwanted side effects. Does not need refill. Denies any history of substance abuse. Has been wearing compression hose.  - concern about depression: patient thinks she might be depressed. Takes doxepin 30mg  nightly for her chronic urticaria. Denies suicidal ideation or thoughts of harming others. Appetite is decreased. Feels very weak some times, other days has lots of energy. Thinks she forgets more than normal. States she's 82 years old and is finally starting to feel old, which is hitting her pretty hard.   Also wanted to talk about full medication review, night sweats, intermittent extreme weakness, decreased appetite but we elected to focus on the above issues since they were more pressing.   ROS: See HPI.  [place]: history of breast cancer, chronic urticaria, osteopenia, chronic leg swelling due to venous insufficiency, peripheral neuropathy  PHYSICAL EXAM: BP 153/84 mmHg  Pulse 88  Temp(Src) 97.8 F (36.6 C) (Oral)  Wt 149 lb (67.586 kg) Gen: NAD, pleasant, cooperative HEENT: normocephalic, atraumatic. MMM Heart: regular rate and rhythm, no murmur Lungs: clear to auscultation bilaterally, normal work of breathing  Neuro: grossly nonfocal, speech normal Ext: bilateral lower extremity swelling present with mild calf tenderness. No erythema or warmth Psych: normal range of affect, well groomed, speech normal in rate and volume, normal eye contact   ASSESSMENT/PLAN:  Leg swelling Need to rule  out acute DVT given increase and erythema over the last 4 days. Will check bilateral lower extremity dopplers. If negative, will restart compression hose. Referral to vascular specialist per patient request given chronicity of leg swelling and vein issues.   Dysthymia Much of patient's concerns about possible depression seem to be a normal adjustment reaction to the realization that she is getting older. Geriatric depression scale performed on patient today, with score of 3, suggests against depression. Only "positive" responses were to: - "have you dropped many of your activities and interests?" yes, due to weakness - "do you often feel helpless?" yes, because can't do the same stuff as before - "do you feel full of energy?" no  Overall suggests more age related decline in mobility rather than depression. Long discussion with patient today about this. No SI/HI. Offered reassurance. Patient will follow up with me in a few weeks to discuss some of her somatic complaints.    FOLLOW UP: Follow up in several weeks for continued discussion of above issues.   [place]. Ardelia Mems, [place]

## 2015-07-12 NOTE — Patient Instructions (Signed)
Checking ultrasound of your legs Follow up with me in a few weeks to see how you're feeling and talk more about the stuff we didn't get to today Will refer to vein specialist  Be well, Dr. Ardelia Mems

## 2015-07-15 DIAGNOSIS — F341 Dysthymic disorder: Secondary | ICD-10-CM | POA: Insufficient documentation

## 2015-07-15 NOTE — Assessment & Plan Note (Signed)
Much of patient's concerns about possible depression seem to be a normal adjustment reaction to the realization that she is getting older. Geriatric depression scale performed on patient today, with score of 3, suggests against depression. Only "positive" responses were to: - "have you dropped many of your activities and interests?" yes, due to weakness - "do you often feel helpless?" yes, because can't do the same stuff as before - "do you feel full of energy?" no  Overall suggests more age related decline in mobility rather than depression. Long discussion with patient today about this. No SI/HI. Offered reassurance. Patient will follow up with me in a few weeks to discuss some of her somatic complaints.

## 2015-07-15 NOTE — Assessment & Plan Note (Signed)
Need to rule out acute DVT given increase and erythema over the last 4 days. Will check bilateral lower extremity dopplers. If negative, will restart compression hose. Referral to vascular specialist per patient request given chronicity of leg swelling and vein issues.

## 2015-07-24 ENCOUNTER — Ambulatory Visit (INDEPENDENT_AMBULATORY_CARE_PROVIDER_SITE_OTHER): Payer: Medicare Other | Admitting: Allergy and Immunology

## 2015-07-24 ENCOUNTER — Encounter: Payer: Self-pay | Admitting: Allergy and Immunology

## 2015-07-24 VITALS — BP 114/88 | HR 80 | Resp 20

## 2015-07-24 DIAGNOSIS — L501 Idiopathic urticaria: Secondary | ICD-10-CM

## 2015-07-24 MED ORDER — PREDNISONE 10 MG PO TABS: ORAL_TABLET | ORAL | Status: DC

## 2015-07-24 MED ORDER — RANITIDINE HCL 150 MG PO TABS: ORAL_TABLET | ORAL | Status: DC

## 2015-07-24 MED ORDER — DOXEPIN HCL 10 MG PO CAPS: 30.0000 mg | ORAL_CAPSULE | Freq: Every day | ORAL | Status: DC

## 2015-07-24 NOTE — Patient Instructions (Signed)
  1. Decrease Prednisone 10 mg alternating with 5 mg every other day    2. Continue doxepin 30 mg in the evening and ranitidine 150 mg twice a day  3. Can add Benadryl if needed  4. EpiPen if needed  5. Return in June 2017 or earlier if problem

## 2015-07-24 NOTE — Progress Notes (Signed)
Follow-up Note  Referring Provider: Leeanne Rio, MD Primary Provider: Chrisandra Netters, MD Date of Office Visit: 07/24/2015  Subjective:   Chloe Lopez (DOB: Jun 05, 1923) is a 82 y.o. female who returns to the [facility] on 07/24/2015 in re-evaluation of the following:  HPI Comments: Morning returns to this clinic in evaluation of her recurrent urticaria and angioedema. While using 10 mg of prednisone a day she has had no episodes at all. She's taper down her ranitidine to 150 mg 3 times per day and continues on doxepin in the evening. She is not interested at all and starting Xolair.     Medication List       This list is accurate as of: 07/24/15 11:56 AM.  Always use your most recent med list.               doxepin 10 MG capsule  Commonly known as:  SINEQUAN  Take 3 capsules (30 mg total) by mouth at bedtime.     EPIPEN 2-PAK 0.3 mg/0.3 mL Soaj injection  Generic drug:  EPINEPHrine  Inject 0.3 mg into the muscle once.     ONE-A-DAY 50 PLUS PO  Take by mouth daily.     predniSONE 10 MG tablet  Commonly known as:  DELTASONE  Take as directed. Alternate 10mg  one day then 5mg  the next.     ranitidine 150 MG tablet  Commonly known as:  ZANTAC  Take one tablet twice a day.        Past Medical History  Diagnosis Date  . Depression   . Sciatica 08/21/2011    Started recently in left hip. Takes tramadol. Does not want surgery. Happens once a week or so.      Past Surgical History  Procedure Laterality Date  . Mastectomy, partial  2009    No Known Allergies  Review of systems negative except as noted in HPI / PMHx or noted below:  Review of Systems  Constitutional: Negative.   HENT: Negative.   Eyes: Negative.   Respiratory: Negative.   Cardiovascular: Negative.   Gastrointestinal: Negative.   Genitourinary: Negative.   Musculoskeletal: Negative.   Skin: Negative.   Neurological: Negative.   Endo/Heme/Allergies: Negative.     Psychiatric/Behavioral: Negative.      Objective:   Filed Vitals:   07/24/15 1126  BP: 114/88  Pulse: 80  Resp: 20          Physical Exam  Constitutional: She is well-developed, well-nourished, and in no distress.  HENT:  Head: Normocephalic.  Right Ear: Tympanic membrane, external ear and ear canal normal.  Left Ear: Tympanic membrane, external ear and ear canal normal.  Nose: Nose normal. No mucosal edema or rhinorrhea.  Mouth/Throat: Uvula is midline, oropharynx is clear and moist and mucous membranes are normal. No oropharyngeal exudate.  Eyes: Conjunctivae are normal.  Neck: Trachea normal. No tracheal tenderness present. No tracheal deviation present. No thyromegaly present.  Cardiovascular: Normal rate, regular rhythm, S1 normal, S2 normal and normal heart sounds.   No murmur heard. Pulmonary/Chest: Breath sounds normal. No stridor. No respiratory distress. She has no wheezes. She has no rales.  Musculoskeletal: She exhibits no edema.  Lymphadenopathy:       Head (right side): No tonsillar adenopathy present.       Head (left side): No tonsillar adenopathy present.    She has no cervical adenopathy.    She has no axillary adenopathy.  Neurological: She is alert.  Gait normal.  Skin: No rash noted. She is not diaphoretic. No erythema. Nails show no clubbing.  Psychiatric: Mood and affect normal.    Diagnostics: None    Assessment and Plan:   1. Idiopathic urticaria     1. Decrease Prednisone 10 mg alternating with 5 mg every other day    2. Continue doxepin 30 mg in the evening and ranitidine 150 mg twice a day  3. Can add Benadryl if needed  4. EpiPen if needed  5. Return in June 2017 or earlier if problem  [place] appears to be doing relatively well and we'll now see if we can lower her dose of systemic steroids as specified above and see her back in this clinic in June 2017 or earlier if there is a problem.  Allena Katz, MD [place]

## 2015-07-25 ENCOUNTER — Telehealth: Payer: Self-pay | Admitting: Allergy and Immunology

## 2015-07-25 NOTE — Telephone Encounter (Signed)
She says that she has medicare and that she has never had to pay this high of an amout for her visits. After looking at it for her last week I found that the Medicare status was elapsed. I reverified it but can you make sure that it covered her last visits please?

## 2015-07-25 NOTE — Telephone Encounter (Signed)
EXPLAINED THAT THIS WENT TO HER DED - SHE PAID IT OVER THE PHONE

## 2015-08-27 ENCOUNTER — Encounter: Payer: Self-pay | Admitting: Vascular Surgery

## 2015-08-28 ENCOUNTER — Encounter: Payer: Medicare Other | Admitting: Vascular Surgery

## 2015-08-30 ENCOUNTER — Encounter: Payer: Self-pay | Admitting: Vascular Surgery

## 2015-08-30 ENCOUNTER — Ambulatory Visit (INDEPENDENT_AMBULATORY_CARE_PROVIDER_SITE_OTHER): Payer: Medicare Other | Admitting: Vascular Surgery

## 2015-08-30 VITALS — BP 148/86 | HR 74 | Temp 97.7°F | Resp 16 | Ht 66.0 in | Wt 152.0 lb

## 2015-08-30 DIAGNOSIS — I83893 Varicose veins of bilateral lower extremities with other complications: Secondary | ICD-10-CM

## 2015-08-30 DIAGNOSIS — I872 Venous insufficiency (chronic) (peripheral): Secondary | ICD-10-CM | POA: Diagnosis not present

## 2015-08-30 NOTE — Progress Notes (Signed)
Referred by:  Leeanne Rio, MD [address] [place], [place] [address]  Reason for referral: bilateral leg swelling   History of Present Illness  Chloe Lopez is a 82 y.o. (1923/06/28) female who presents with chief complaint: painful shins.  Patient notes, onset of worsening leg swelling over last 4 months ago, associated with ambulation.  The patient's symptoms include: leg swelling and increased sensitivity in skin her shin.  The patient has had no history of DVT, known history of pregnancy, known history of varicose vein, no history of venous stasis ulcers, no history of  Lymphedema and no history of skin changes in lower legs.  There is no family history of venous disorders.  The patient has used sequential velco stocking due to difficulty with compression stockings in the past.  Additionally, this patient notes pain that starts in lower leg a few hours after sitting bed.  It has vague character and has been associated with numbness in her feet.  She denies intermittent claudication.   Past Medical History  Diagnosis Date  . Depression   . Sciatica 08/21/2011    Started recently in left hip. Takes tramadol. Does not want surgery. Happens once a week or so.    . Angioedema     Past Surgical History  Procedure Laterality Date  . Mastectomy, partial  2009  . Abdominal hysterectomy      Social History   Social History  . Marital Status: Single    Spouse Name: N/A  . Number of Children: 3  . Years of Education: college   Occupational History  . RETIRED     histologist   Social History Main Topics  . Smoking status: Former Smoker    Quit date: 12/15/1995  . Smokeless tobacco: Never Used  . Alcohol Use: 1.2 oz/week    2 Standard drinks or equivalent per week  . Drug Use: No  . Sexual Activity: Not on file   Other Topics Concern  . Not on file   Social History Narrative   Health Care POA:    Emergency Contact: daughter, Doreen Salvage (c)  [phone]   End of Life Plan:    Who lives with you: self   Any pets: none   Diet: Pt has a varied diet of protein, starch, and vegetables.  Pt reports eating little red meat.   Exercise: Pt has not regular exercise routine but is very active.   Seatbelts: Pt reports wearing seatbelt when in vehicle.    Sun Exposure/Protection: Pt reports not using sun protectin.   Hobbies: Reading, gardening, spending time with grandson, Barnabas Lister          Family History: no significant medical problems in family per patient  Current Outpatient Prescriptions  Medication Sig Dispense Refill  . doxepin (SINEQUAN) 10 MG capsule Take 3 capsules (30 mg total) by mouth at bedtime. 90 capsule 5  . Multiple Vitamins-Minerals (ONE-A-DAY 50 PLUS PO) Take by mouth daily.    . predniSONE (DELTASONE) 10 MG tablet Take as directed. Alternate 10mg  one day then 5mg  the next. 100 tablet 0  . predniSONE (DELTASONE) 5 MG tablet TAKE 1 TABLET BY MOUTH DAILY (Patient taking differently: TAKE 2 TABLET BY MOUTH DAILY) 90 tablet 1  . ranitidine (ZANTAC) 150 MG tablet Take one tablet twice a day. 60 tablet 5  . EPINEPHrine (EPIPEN 2-PAK) 0.3 mg/0.3 mL IJ SOAJ injection Inject 0.3 mg into the muscle once. Reported on 08/30/2015    . [DISCONTINUED]  DOXEPIN HCL PO Take by mouth. 30 mg  Twice a day     No current facility-administered medications for this visit.    No Known Allergies   REVIEW OF SYSTEMS:  (Positives checked otherwise negative)  CARDIOVASCULAR:   [ ]  chest pain,  [ ]  chest pressure,  [ ]  palpitations,  [ ]  shortness of breath when laying flat,  [ ]  shortness of breath with exertion,   [ ]  pain in feet when walking,  [ ]  pain in feet when laying flat, [ ]  history of blood clot in veins (DVT),  [ ]  history of phlebitis,  [ ]  swelling in legs,  [ ]  varicose veins  PULMONARY:   [ ]  productive cough,  [ ]  asthma,  [ ]  wheezing  NEUROLOGIC:   [ ]  weakness in arms or legs,  [ ]  numbness in arms or legs,   [ ]  difficulty speaking or slurred speech,  [ ]  temporary loss of vision in one eye,  [ ]  dizziness  HEMATOLOGIC:   [ ]  bleeding problems,  [ ]  problems with blood clotting too easily  MUSCULOSKEL:   [ ]  joint pain, [ ]  joint swelling  GASTROINTEST:   [ ]  vomiting blood,  [ ]  blood in stool     GENITOURINARY:   [ ]  burning with urination,  [ ]  blood in urine  PSYCHIATRIC:   [ ]  history of major depression  INTEGUMENTARY:   [ ]  rashes,  [ ]  ulcers  CONSTITUTIONAL:   [ ]  fever,  [ ]  chills   Physical Examination  Filed Vitals:   08/30/15 1007 08/30/15 1012  BP: 152/56 148/86  Pulse: 56 74  Temp: 97.7 F (36.5 C)   Resp: 16   Height: 5\' 6"  (1.676 m)   Weight: 152 lb (68.947 kg)   SpO2: 96%    Body mass index is 24.55 kg/(m^2).  General: A&O x 3, WD, thin, younger than age  Head: [place]/AT,   Ear/Nose/Throat: Hearing grossly intact, nares without erythema or drainage, oropharynx without Erythema/Exudate, Mallampati score: 3  Eyes: PERRLA, EOMI  Neck: Supple, no nuchal rigidity, no palpable LAD  Pulmonary: Sym exp, good air movt, CTAB, no rales, rhonchi, & wheezing  Cardiac: RRR, Nl S1, S2, no Murmurs, rubs or gallops  Vascular: Vessel Right Left  Radial Palpable Palpable  Brachial Palpable Palpable  Carotid Palpable, without bruit Palpable, without bruit  Aorta Not palpable N/A  Femoral Palpable Palpable  Popliteal Not palpable Not palpable  PT Faintly Palpable Faintly Palpable  DP Palpable Faintly Palpable   Gastrointestinal: soft, NTND, no G/R, no HSM, no masses, no CVAT B  Musculoskeletal: M/S 5/5 throughout , Extremities without ischemic changes , + mild LDS B ankle, no edema, extensive varicosities  Neurologic: CN 2-12 intact , Pain and light touch intact in extremities , Motor exam as listed above  Psychiatric: Judgment intact, Mood & affect appropriate for pt's clinical situation  Dermatologic: See M/S exam for extremity exam, no rashes  otherwise noted  Lymph : No Cervical, Axillary, or Inguinal lymphadenopathy    Non-Invasive Vascular Imaging  BLE venous duplex (07/16/15): no DVT  Outside Studies/Documentation 4 pages of outside documents were reviewed including: outpt PCP chart and outside DVT duplex.   Medical Decision Making  Chloe Lopez is a 82 y.o. female who presents with: BLE chronic venous insufficiency (C4), varicose veins with complications, likely non-vasculogenic leg pain   Based on the patient's history and examination, I recommend: compressive  therapy.  Will get BLE venous reflux to determine the severity and location of her venous incompetence  Also will be ABI to confirm that her leg pain is not arterial in origin.  The time course and character of her leg sx are suggestive of possible neuropathic origin possibly of spinal origin  Thank you for allowing [place] to participate in this patient's care.   Adele Barthel, MD [facility] Office: [phone] Pager: [phone]  08/30/2015, 10:45 AM

## 2015-09-02 ENCOUNTER — Ambulatory Visit (INDEPENDENT_AMBULATORY_CARE_PROVIDER_SITE_OTHER): Payer: Medicare Other | Admitting: Family Medicine

## 2015-09-02 ENCOUNTER — Encounter: Payer: Self-pay | Admitting: Family Medicine

## 2015-09-02 VITALS — BP 134/79 | HR 79 | Temp 98.0°F | Wt 151.0 lb

## 2015-09-02 DIAGNOSIS — F341 Dysthymic disorder: Secondary | ICD-10-CM | POA: Diagnosis not present

## 2015-09-02 DIAGNOSIS — G6289 Other specified polyneuropathies: Secondary | ICD-10-CM

## 2015-09-02 MED ORDER — TRAMADOL HCL 50 MG PO TABS: 50.0000 mg | ORAL_TABLET | Freq: Every day | ORAL | Status: DC | PRN

## 2015-09-02 NOTE — Assessment & Plan Note (Signed)
Improved    Follow up as needed

## 2015-09-02 NOTE — Progress Notes (Signed)
Date of Visit: 09/02/2015   HPI:  Patient presents for routine follow up.  Leg pain - saw Dr. Bridgett Larsson of vascular surgery. Planning for BLE venous reflux study and ABI to evaluate veins & arteries of legs. She was very reassured to hear that she has no major issues with her legs and is looking forward to these studies. Takes tramadol for leg pain, not more than once per day. Only takes as needed, not every day. Wants to be sure it's okay to continue on the medication like this. In the past she did not tolerate gabapentin. Has numbness type feeling in legs. Painful at night after sleeping.  Dysthymia - doing better. Feels happy. No concerns today.  ROS: See HPI.  Hope: history of breast cancer, osteopenia, hives/angioedema, venous insufficiency, varicose veins  PHYSICAL EXAM: BP 134/79 mmHg  Pulse 79  Temp(Src) 98 F (36.7 C) (Oral)  Wt 151 lb (68.493 kg) Gen: NAD, pleasant, cooperative HEENT: normocephalic, atraumatic  Lungs: normal work of breathing  Neuro: alert, grossly nonfocal, speech normal Ext: 1+ bilateral lower extremity edema. No calf tenderness or erythema. 2+ dp pulses bilaterally. Psych: normal range of affect, well groomed, speech normal in rate and volume, normal eye contact   ASSESSMENT/PLAN:  Peripheral neuropathy Stable overall. Vascular workup pending, appreciate vascular surgery seeing her. Discussed with patient that I am okay with her cautiously continuing tramadol. Not to be taken more than once per day. Only take if she really needs it. [place] Controlled Substance Database reviewed, findings are appropriate. Refill for tramadol given today. Follow up in 3 months, sooner if needed.  Dysthymia Improved. Follow up as needed    FOLLOW UP: Follow up in 3 months for chronic medical issues, sooner if needed  [place] J. Ardelia Mems, [place]

## 2015-09-02 NOTE — Assessment & Plan Note (Addendum)
Stable overall. Vascular workup pending, appreciate vascular surgery seeing her. Discussed with patient that I am okay with her cautiously continuing tramadol. Not to be taken more than once per day. Only take if she really needs it. [place] Controlled Substance Database reviewed, findings are appropriate. Refill for tramadol given today. Follow up in 3 months, sooner if needed.

## 2015-09-02 NOTE — Patient Instructions (Signed)
Great to see you today. Refilled tramadol Ok to use but only as needed, not more than once per day.  Follow up with me in 3 months, sooner if any issues  Be well, Dr. Ardelia Mems

## 2015-09-03 ENCOUNTER — Other Ambulatory Visit: Payer: Self-pay | Admitting: *Deleted

## 2015-09-03 DIAGNOSIS — I83893 Varicose veins of bilateral lower extremities with other complications: Secondary | ICD-10-CM

## 2015-09-03 DIAGNOSIS — I739 Peripheral vascular disease, unspecified: Secondary | ICD-10-CM

## 2015-10-01 ENCOUNTER — Encounter: Payer: Self-pay | Admitting: Vascular Surgery

## 2015-10-02 ENCOUNTER — Ambulatory Visit (INDEPENDENT_AMBULATORY_CARE_PROVIDER_SITE_OTHER)
Admission: RE | Admit: 2015-10-02 | Discharge: 2015-10-02 | Disposition: A | Discharge status: Home / Self Care | Payer: Medicare Other | Source: Ambulatory Visit | Attending: Vascular Surgery | Admitting: Vascular Surgery

## 2015-10-02 ENCOUNTER — Ambulatory Visit (HOSPITAL_COMMUNITY)
Admission: RE | Admit: 2015-10-02 | Discharge: 2015-10-02 | Disposition: A | Discharge status: Home / Self Care | Payer: Medicare Other | Source: Ambulatory Visit | Attending: Vascular Surgery | Admitting: Vascular Surgery

## 2015-10-02 DIAGNOSIS — I83893 Varicose veins of bilateral lower extremities with other complications: Secondary | ICD-10-CM | POA: Diagnosis not present

## 2015-10-02 DIAGNOSIS — F329 Major depressive disorder, single episode, unspecified: Secondary | ICD-10-CM | POA: Diagnosis not present

## 2015-10-02 DIAGNOSIS — I739 Peripheral vascular disease, unspecified: Secondary | ICD-10-CM | POA: Insufficient documentation

## 2015-10-02 DIAGNOSIS — R0989 Other specified symptoms and signs involving the circulatory and respiratory systems: Secondary | ICD-10-CM | POA: Diagnosis present

## 2015-10-02 NOTE — Progress Notes (Signed)
Established Venous Insufficiency   History of Present Illness  Chloe Lopez is a 82 y.o. (28-Jun-1923) female who presents with chief complaint: bilateral lower leg pain.  The patient's symptoms have not progressed.  The patient's symptoms are: aching in lower legs bilaterally after sitting in bed for a few hours.  The patient is compliant with compression stockings.  The patient's PMH, PSH, SH, and FamHx are unchanged from 08/30/15.  Current Outpatient Prescriptions  Medication Sig Dispense Refill  . doxepin (SINEQUAN) 10 MG capsule Take 3 capsules (30 mg total) by mouth at bedtime. 90 capsule 5  . EPINEPHrine (EPIPEN 2-PAK) 0.3 mg/0.3 mL IJ SOAJ injection Inject 0.3 mg into the muscle once. Reported on 08/30/2015    . Multiple Vitamins-Minerals (ONE-A-DAY 50 PLUS PO) Take by mouth daily.    . predniSONE (DELTASONE) 10 MG tablet Take as directed. Alternate 10mg  one day then 5mg  the next. 100 tablet 0  . predniSONE (DELTASONE) 5 MG tablet TAKE 1 TABLET BY MOUTH DAILY (Patient taking differently: TAKE 2 TABLET BY MOUTH DAILY) 90 tablet 1  . ranitidine (ZANTAC) 150 MG tablet Take one tablet twice a day. 60 tablet 5  . traMADol (ULTRAM) 50 MG tablet Take 1 tablet (50 mg total) by mouth daily as needed (pain in legs). 30 tablet 3  . [DISCONTINUED] DOXEPIN HCL PO Take by mouth. 30 mg  Twice a day     No current facility-administered medications for this visit.    No Known Allergies  On ROS today: +legs swelling, B calf aching without walking   Physical Examination  Filed Vitals:   10/04/15 0856  BP: 127/89  Pulse: 80  Height: 5\' 6"  (1.676 m)  Weight: 152 lb 4.8 oz (69.083 kg)  SpO2: 96%   Body mass index is 24.59 kg/(m^2).  General: A&O x 3, WDWN  Pulmonary: Sym exp, good air movt, CTAB, no rales, rhonchi, & wheezing  Cardiac: RRR, Nl S1, S2, no Murmurs, rubs or gallops  Vascular: Vessel Right Left  Radial Palpable Palpable  Brachial Palpable Palpable  Carotid  Palpable, without bruit Palpable, without bruit  Aorta Not palpable N/A  Femoral Palpable Palpable  Popliteal Not palpable Not palpable  PT Palpable Palpable  DP Palpable Palpable   Gastrointestinal: soft, NTND, no G/R, no HSM, - masses, no CVAT B  Musculoskeletal: M/S 5/5 throughout , Extremities without ischemic changes , B small to moderate varicosities, no LDS, LLE edema 1+  Neurologic: Pain and light touch intact in extremities , Motor exam as listed above  Non-Invasive Vascular Imaging  BLE Venous Insufficiency Duplex (Date: 10/02/2015):   RLE:   no DVT and SVT,   no GSV reflux,   no SSV reflux,  + deep venous reflux: CFV  LLE:  no DVT and SVT,   no GSV reflux,   no SSV reflux,  + deep venous reflux: CFV   ABI (Date: 10/02/2015)  R:   ABI: 1.0,   DP: tri,   PT: tri,   TBI: 0.84  L:   ABI: 1.08,   DP: tri,   PT: tri,   TBI: 0.88   Medical Decision Making  Chloe Lopez is a 82 y.o. female who presents with: B leg chronic venous insufficiency (C2), No evidence of PAD   This patient's sx are NOT related to an arterial or venous etiology.  May need to consider neuropathy and spinal stenosis in the differential diagnosis.  Based on the patient's vascular studies and examination,  I have offered the patient: compressive therapy.  Pt is already using compression stockings.  Thank you for allowing [place] to participate in this patient's care.   Adele Barthel, MD [facility] Office: [phone] Pager: [phone]  10/02/2015, 8:28 AM

## 2015-10-04 ENCOUNTER — Ambulatory Visit (INDEPENDENT_AMBULATORY_CARE_PROVIDER_SITE_OTHER): Payer: Medicare Other | Admitting: Vascular Surgery

## 2015-10-04 ENCOUNTER — Encounter: Payer: Self-pay | Admitting: Vascular Surgery

## 2015-10-04 VITALS — BP 127/89 | HR 80 | Ht 66.0 in | Wt 152.3 lb

## 2015-10-04 DIAGNOSIS — I83893 Varicose veins of bilateral lower extremities with other complications: Secondary | ICD-10-CM

## 2015-12-04 ENCOUNTER — Other Ambulatory Visit: Payer: Self-pay

## 2015-12-04 ENCOUNTER — Telehealth: Payer: Self-pay | Admitting: Allergy and Immunology

## 2015-12-04 MED ORDER — PREDNISONE 5 MG PO TABS: 5.0000 mg | ORAL_TABLET | Freq: Every day | ORAL | 0 refills | Status: DC

## 2015-12-04 MED ORDER — PREDNISONE 10 MG PO TABS: ORAL_TABLET | ORAL | 0 refills | Status: DC

## 2015-12-04 NOTE — Telephone Encounter (Signed)
Sent in 1 refill and will notify pt that she is getting one refill and will need an office visit.

## 2015-12-04 NOTE — Telephone Encounter (Signed)
Informed pt that she needs an appointment and that her refills where sent in

## 2015-12-04 NOTE — Telephone Encounter (Signed)
Dr. Neldon Mc patient. She is suppose to have a refill for her Prednisone at [facility]. She checked and it hasn't been called in yet. She is going out of town Friday and only has 2 pills left. Would like you to call in this prescription.

## 2015-12-04 NOTE — Telephone Encounter (Signed)
Is it ok to send in refill for prednisone?

## 2015-12-04 NOTE — Telephone Encounter (Signed)
Please provide patient with refill of prednisone with refills up to 6 months from last visit

## 2015-12-18 ENCOUNTER — Encounter: Payer: Self-pay | Admitting: Allergy and Immunology

## 2015-12-18 ENCOUNTER — Ambulatory Visit (INDEPENDENT_AMBULATORY_CARE_PROVIDER_SITE_OTHER): Payer: Medicare Other | Admitting: Allergy and Immunology

## 2015-12-18 ENCOUNTER — Encounter (INDEPENDENT_AMBULATORY_CARE_PROVIDER_SITE_OTHER): Payer: Self-pay

## 2015-12-18 VITALS — BP 102/72 | HR 58 | Resp 16

## 2015-12-18 DIAGNOSIS — L501 Idiopathic urticaria: Secondary | ICD-10-CM | POA: Diagnosis not present

## 2015-12-18 DIAGNOSIS — G629 Polyneuropathy, unspecified: Secondary | ICD-10-CM

## 2015-12-18 DIAGNOSIS — R531 Weakness: Secondary | ICD-10-CM

## 2015-12-18 MED ORDER — PREDNISONE 10 MG PO TABS: ORAL_TABLET | ORAL | 5 refills | Status: DC

## 2015-12-18 MED ORDER — DOXEPIN HCL 10 MG PO CAPS: 30.0000 mg | ORAL_CAPSULE | Freq: Every day | ORAL | 5 refills | Status: DC

## 2015-12-18 MED ORDER — PREDNISONE 5 MG PO TABS: ORAL_TABLET | ORAL | 5 refills | Status: DC

## 2015-12-18 NOTE — Progress Notes (Signed)
Follow-up Note  Referring Provider: Leeanne Rio, MD Primary Provider: Chrisandra Netters, MD Date of Office Visit: 12/18/2015  Subjective:   Chloe Lopez (DOB: 1923/11/25) is a 82 y.o. female who returns to the [facility] on 12/18/2015 in re-evaluation of the following:  HPI: Chloe Lopez returns to this clinic in evaluation of her recurrent urticaria with intermittent episodes of angioedema. While consistently using 10 mg of prednisone alternating with 5 mg of prednisone every other day and continuing on doxepin and ranitidine she has not had any significant problems with her urticaria or angioedema since I last saw her in his clinic in March 2017.  She has had some problems with intermittent episodes of weakness which have been a problem over the course of the past several years. Her weakness appears to be muscular and there is a day when she is weak and a day when she is not weak and there is no real pattern that she can describe regarding this issue. This does not appear to be associated with any systemic or constitutional symptoms and there is no obvious trigger. She has stopped drinking alcohol several years ago. She does continue on prednisone however.  In addition, it sounds as though she is developing a neuropathy in her feet. She will have some painful achiness involving her feet up into lower shin area. She did go see a vein specialist regarding the possibility that this was secondary to her varicose veins but the vein specialist did not suggest any specific therapy to address this issue.    Medication List      doxepin 10 MG capsule Commonly known as:  SINEQUAN Take 3 capsules (30 mg total) by mouth at bedtime.   EPIPEN 2-PAK 0.3 mg/0.3 mL Soaj injection Generic drug:  EPINEPHrine Inject 0.3 mg into the muscle once. Reported on 08/30/2015   ONE-A-DAY 50 PLUS PO Take by mouth daily.   predniSONE 10 MG tablet Commonly known as:  DELTASONE Take as  directed. Alternate 10mg  one day then 5mg  the next.   predniSONE 5 MG tablet Commonly known as:  DELTASONE Take as directed. Alternate every other day with 10mg .   ranitidine 150 MG tablet Commonly known as:  ZANTAC Take one tablet twice a day.   traMADol 50 MG tablet Commonly known as:  ULTRAM Take 1 tablet (50 mg total) by mouth daily as needed (pain in legs).       Past Medical History:  Diagnosis Date  . Angioedema   . Depression   . Sciatica 08/21/2011   Started recently in left hip. Takes tramadol. Does not want surgery. Happens once a week or so.    Marland Kitchen Urticaria     Past Surgical History:  Procedure Laterality Date  . ABDOMINAL HYSTERECTOMY    . MASTECTOMY, PARTIAL  2009    No Known Allergies  Review of systems negative except as noted in HPI / PMHx or noted below:  Review of Systems  Constitutional: Negative.   HENT: Negative.   Eyes: Negative.   Respiratory: Negative.   Cardiovascular: Negative.   Gastrointestinal: Negative.   Genitourinary: Negative.   Musculoskeletal: Negative.   Skin: Negative.   Neurological: Negative.   Endo/Heme/Allergies: Negative.   Psychiatric/Behavioral: Negative.      Objective:   Vitals:   12/18/15 0917  BP: 102/72  Pulse: (!) 58  Resp: 16          Physical Exam  Constitutional: She is well-developed, well-nourished, and in  no distress.  HENT:  Head: Normocephalic.  Right Ear: Tympanic membrane, external ear and ear canal normal.  Left Ear: Tympanic membrane, external ear and ear canal normal.  Nose: Nose normal. No mucosal edema or rhinorrhea.  Mouth/Throat: Uvula is midline, oropharynx is clear and moist and mucous membranes are normal. No oropharyngeal exudate.  Eyes: Conjunctivae are normal.  Neck: Trachea normal. No tracheal tenderness present. No tracheal deviation present. No thyromegaly present.  Cardiovascular: Normal rate, regular rhythm, S1 normal, S2 normal and normal heart sounds.   No murmur  heard. Pulmonary/Chest: Breath sounds normal. No stridor. No respiratory distress. She has no wheezes. She has no rales.  Musculoskeletal: She exhibits no edema.  Lymphadenopathy:       Head (right side): No tonsillar adenopathy present.       Head (left side): No tonsillar adenopathy present.    She has no cervical adenopathy.  Neurological: She is alert. Gait normal.  Skin: No rash noted. She is not diaphoretic. No erythema. Nails show no clubbing.  Psychiatric: Mood and affect normal.    Diagnostics: None   Assessment and Plan:   1. Idiopathic urticaria   2. Weakness   3. Neuropathy ([place])     1. Continue Prednisone 10 mg alternating with 5 mg every other day    2. Continue doxepin 30 mg in the evening and ranitidine 150 mg twice a day  3. Can add Benadryl and over-the-counter magnesium if needed  4. EpiPen if needed  5. Return in January 2017 or earlier if problem  6. Obtain fall flu vaccine  I'm going to keep Chloe Lopez on prednisone and doxepin and ranitidine which is working quite well regarding control of her chronic urticaria and angioedema. In the past I tried to have her use Xolair to address this issue but she is not interested in using this medication at this point in time and is very satisfied with the response that she is receiving the prednisone even though she understands all the potential side effects that can occur while utilizing this medication consistently. I do not know the cause of her intermittent weakness or her apparent neuropathy involving her lower extremities. She clearly stated to me that she does not want to undergo evaluation or any testing regarding this issue as she is 82 years old. I made a recommendation that she can try to use over-the-counter magnesium at 1000 mg twice a day to see if this helps at all with some of her weakness and her neuropathy. If that was the case then I would be an easy fix and a relatively safe fix for this issue. I will see her  back in this clinic in 6 months or earlier if there is a problem.  Allena Katz, MD [place]

## 2015-12-18 NOTE — Patient Instructions (Addendum)
  1. Continue Prednisone 10 mg alternating with 5 mg every other day    2. Continue doxepin 30 mg in the evening and ranitidine 150 mg twice a day  3. Can add Benadryl and over-the-counter magnesium if needed  4. EpiPen if needed  5. Return in January 2017 or earlier if problem  6. Obtain fall flu vaccine

## 2016-01-07 ENCOUNTER — Ambulatory Visit (INDEPENDENT_AMBULATORY_CARE_PROVIDER_SITE_OTHER): Payer: Medicare Other | Admitting: Family Medicine

## 2016-01-07 ENCOUNTER — Encounter: Payer: Self-pay | Admitting: Family Medicine

## 2016-01-07 VITALS — BP 128/62 | HR 81 | Temp 98.1°F | Wt 151.0 lb

## 2016-01-07 DIAGNOSIS — G2581 Restless legs syndrome: Secondary | ICD-10-CM | POA: Diagnosis not present

## 2016-01-07 DIAGNOSIS — G6289 Other specified polyneuropathies: Secondary | ICD-10-CM

## 2016-01-07 DIAGNOSIS — Z23 Encounter for immunization: Secondary | ICD-10-CM | POA: Diagnosis not present

## 2016-01-07 DIAGNOSIS — G609 Hereditary and idiopathic neuropathy, unspecified: Secondary | ICD-10-CM | POA: Diagnosis not present

## 2016-01-07 MED ORDER — TRAMADOL HCL 50 MG PO TABS: 50.0000 mg | ORAL_TABLET | Freq: Every day | ORAL | 3 refills | Status: DC | PRN

## 2016-01-07 NOTE — Progress Notes (Signed)
Date of Visit: 01/07/2016   HPI:  Patient presents for routine follow up and medication refill.  Saw vascular surgery for her vein issues and was told there was nothing amenable to a procedure to fix her veins. Has venous stasis/insufficiency but otherwise no major issues. Legs are doing well now. Has much less swelling now, as she's figured out she cannot stand on her feet all day, has to take breaks and prop her legs up. Does get some burning in her legs and then days later a new vein will appear. Takes tramadol for discomfort. Takes it before bed at night, only once per day. No falls. Tramadol helps her sleep with the leg discomfort. She does have a sensation of needing to move legs a lot at night. Saw her allergist recently who recommended she try over the counter magnesium supplements. She is not sure what dose she should take.  ROS: See HPI.  Chloe Lopez: history of peripheral neuropathy, varicose veins, venous insufficiency, chronic angioedema  PHYSICAL EXAM: BP 128/62 (BP Location: Left Arm, Patient Position: Sitting, Cuff Size: Normal)   Pulse 81   Temp 98.1 F (36.7 C) (Oral)   Wt 151 lb (68.5 kg)   BMI 24.37 kg/m  Gen: NAD, pleasant, cooperative HEENT: normocephalic, atraumatic  Lungs: normal work of breathing  Neuro: alert grossly nonfocal speech normal Ext: no lower extremity edema bilaterally. Some superficial/varicose veins present on bilateral legs.  ASSESSMENT/PLAN:  Health maintenance:  -flu shot given today -not a candidate for zostavax due to chronic prednisone use/immunocompromise.  Peripheral neuropathy Stable presently. Swelling greatly improved with intermittently propping legs up during the day. To rule out metabolic causes will also check B12, RPR. Recent normal TSH. Discussed risks/benefits of tramadol with patient - she would like to continue. Using it only once per day. Will refill today. Follow up in 3 months.  Also reviewed magnesium supplement dosing -  recommended magnesium oxide 250mg  once daily as initial dose.  FOLLOW UP: Follow up in 3 mos for neuropathy  [place] J. Ardelia Mems, [place]

## 2016-01-07 NOTE — Patient Instructions (Signed)
It was great to see you again today!  Checking some labwork today Getting flu shot today  Refilled tramadol for 3 months  Magnesium - try 250mg  of magnesium oxide once a day to start  Be well, Dr. Ardelia Mems

## 2016-01-08 LAB — RPR

## 2016-01-08 LAB — VITAMIN B12: Vitamin B-12: 575 pg/mL (ref 200–1100)

## 2016-01-09 NOTE — Assessment & Plan Note (Addendum)
Stable presently. Swelling greatly improved with intermittently propping legs up during the day. To rule out metabolic causes will also check B12, RPR. Recent normal TSH. Discussed risks/benefits of tramadol with patient - she would like to continue. Using it only once per day. Will refill today. Follow up in 3 months.  Also reviewed magnesium supplement dosing - recommended magnesium oxide 250mg  once daily as initial dose.

## 2016-01-14 DIAGNOSIS — J069 Acute upper respiratory infection, unspecified: Secondary | ICD-10-CM | POA: Diagnosis not present

## 2016-01-17 ENCOUNTER — Encounter: Payer: Self-pay | Admitting: Family Medicine

## 2016-05-19 ENCOUNTER — Ambulatory Visit (INDEPENDENT_AMBULATORY_CARE_PROVIDER_SITE_OTHER): Payer: Medicare Other | Admitting: Family Medicine

## 2016-05-19 ENCOUNTER — Encounter: Payer: Self-pay | Admitting: Family Medicine

## 2016-05-19 DIAGNOSIS — F341 Dysthymic disorder: Secondary | ICD-10-CM

## 2016-05-19 DIAGNOSIS — G6289 Other specified polyneuropathies: Secondary | ICD-10-CM

## 2016-05-19 MED ORDER — TRAMADOL HCL 50 MG PO TABS: 50.0000 mg | ORAL_TABLET | Freq: Every day | ORAL | 1 refills | Status: DC | PRN

## 2016-05-19 NOTE — Progress Notes (Signed)
Date of Visit: 05/19/2016   HPI:  Patient presents for follow up. She is accompanied by her daughter Jacqlyn Larsen.  She has a list of complaints she brought with her today: - wonders if she is depressed. Notices her body is changing and she is not able to do as much as she used to do. Feels saddened by this.  Denies thoughts of harming herself or others. - skin and veins are fragile - has noticed if she hits her hand on something it bruises easily and skin tears more easily - feels generally tired and achy - notes allergic reaction to some medications she took back in September - went to urgent care and was started on course of tessalon and azithromycin. Patient has history of chronic angioedema, and had worsening angioedema after taking these medications (had facial swelling and swelling of her mouth/lips/tongue). Just wants me to be aware of this. Will add to allergy list.  Also needs refill on tramadol. Daughter Jacqlyn Larsen is concerned about the tramadol use. Patient takes it at most once per day. Jacqlyn Larsen is worried that patient will become dependent on this medication, and is also concerned about using it along with doxepin (prescribed by Dr. Carmelina Peal for angioedema). Patient does not feel she has a problem with using the tramadol. Takes it at night in order to relieve pains in her legs, which helps her sleep. Is better rested when she takes this at night. Denies any unwanted side effects of the medication. No history of seizures. She is actually working on lowering her doxepin dose herself (encouraged her to follow up with Dr. Carmelina Peal for this). No recent falls.   ROS: See HPI.  [place]: history of breast cancer, hives/urticaria, osteopenia, chronic venous insufficiency  PHYSICAL EXAM: BP (!) 148/78   Pulse 71   Temp 97.4 F (36.3 C) (Oral)   Ht 5\' 6"  (1.676 m)   Wt 153 lb (69.4 kg)   SpO2 96%   BMI 24.69 kg/m  Gen: NAD, pleasant, cooperative HEENT: normocephalic, atraumatic, moist mucous membranes   Heart: regular rate and rhythm, no murmur Lungs: clear to auscultation bilaterally, normal work of breathing  Neuro: alert, grossly nonfocal, speech normal Ext: minimal lower extremity edema bilaterally. No erythema or warmth of either calf. Negative homans. 2+ DP pulses bilaterally. Psych: normal range of affect, well groomed, speech normal in rate and volume, normal eye contact. No SI/HI. Occasionally tearful. Skin: thin skin noted. No bruising or skin breakdown.  ASSESSMENT/PLAN:  Health maintenance:  -need to discuss a few HM items with patient but did not have time for this today (visit lasted nearly 45 minutes). Will plan to try and discuss pneumovax, Tdap, and DEXA scan when I see her next.  Peripheral neuropathy Continues to have good relief of symptoms with daily use of tramadol. Long discussion with patient and her daughter today about this medication. Daughter has some concerns about patient becoming dependent on it, though patient feels this is not a concern. Medically, I am comfortable with her continuing once daily dosing since she has so much relief with it. Reviewed interactions of doxepin and tramadol - risk is serotonin syndrome (less likely given long term stability) and lowering seizure threshold (no history of seizures in patient). Advised there are always risks with any medication, and we will continue to monitor for unwanted side effects. Patient accepts these risks and wants to continue tramadol. We did discuss trying a lower dose - patient will split pill in half and try 25mg  to  see if she has benefit from a lower dose. - discussed possible benefit of home physical therapy to help with mobility, aches/pains but patient not interested in this at this time. Encouraged her to consider it.  - refill given for 2 months. Follow up with me to continue discussions before future refills.  - encouraged follow up in geriatrics clinic to continue discussions  Dysthymia May very well  be depressed. With lots of somatic complaints (aches, pains, fatigue) as well as some mood dysthymia, would benefit from more comprehensive geriatric assessment. Our visit was already prolonged today (>40 mins) and thus could not do full assessment for mood disorder today. No SI/HI. Discussed recommendation for follow up in geriatrics clinic with patient and her daughter, who are appreciative of and agreeable to this recommendation. They will schedule a follow up visit in geriatrics clinic with Dr. McDiarmid.  FOLLOW UP: Follow up in geriatrics clinic, schedule with me after that.  Greater than 25 minutes were spent during this encounter, with at least 50% of the time devoted to counseling and coordination of care.   [place]. Ardelia Mems, [place]

## 2016-05-19 NOTE — Progress Notes (Signed)
fu

## 2016-05-19 NOTE — Patient Instructions (Signed)
Refilled tramadol for 2 months Try the 25mg  dose (break pill in half)  Schedule a visit in geriatrics clinic with Dr. McDiarmid  See me after that visit Consider home health physical therapy - let me know if you want to try this  Be well, Dr. Ardelia Mems

## 2016-05-21 NOTE — Assessment & Plan Note (Addendum)
Continues to have good relief of symptoms with daily use of tramadol. Long discussion with patient and her daughter today about this medication. Daughter has some concerns about patient becoming dependent on it, though patient feels this is not a concern. Medically, I am comfortable with her continuing once daily dosing since she has so much relief with it. Reviewed interactions of doxepin and tramadol - risk is serotonin syndrome (less likely given long term stability) and lowering seizure threshold (no history of seizures in patient). Advised there are always risks with any medication, and we will continue to monitor for unwanted side effects. Patient accepts these risks and wants to continue tramadol. We did discuss trying a lower dose - patient will split pill in half and try 25mg  to see if she has benefit from a lower dose. - discussed possible benefit of home physical therapy to help with mobility, aches/pains but patient not interested in this at this time. Encouraged her to consider it.  - refill given for 2 months. Follow up with me to continue discussions before future refills.  - encouraged follow up in geriatrics clinic to continue discussions

## 2016-05-22 NOTE — Assessment & Plan Note (Signed)
May very well be depressed. With lots of somatic complaints (aches, pains, fatigue) as well as some mood dysthymia, would benefit from more comprehensive geriatric assessment. Our visit was already prolonged today (>40 mins) and thus could not do full assessment for mood disorder today. No SI/HI. Discussed recommendation for follow up in geriatrics clinic with patient and her daughter, who are appreciative of and agreeable to this recommendation. They will schedule a follow up visit in geriatrics clinic with Dr. McDiarmid.

## 2016-06-04 ENCOUNTER — Ambulatory Visit (INDEPENDENT_AMBULATORY_CARE_PROVIDER_SITE_OTHER): Payer: Medicare Other | Admitting: Family Medicine

## 2016-06-04 ENCOUNTER — Encounter: Payer: Self-pay | Admitting: Family Medicine

## 2016-06-04 VITALS — BP 118/60 | HR 63 | Temp 98.3°F | Ht 66.0 in | Wt 160.0 lb

## 2016-06-04 DIAGNOSIS — H93299 Other abnormal auditory perceptions, unspecified ear: Secondary | ICD-10-CM | POA: Diagnosis not present

## 2016-06-04 DIAGNOSIS — R5383 Other fatigue: Secondary | ICD-10-CM

## 2016-06-04 DIAGNOSIS — R4181 Age-related cognitive decline: Secondary | ICD-10-CM | POA: Diagnosis present

## 2016-06-04 DIAGNOSIS — Z7952 Long term (current) use of systemic steroids: Secondary | ICD-10-CM | POA: Diagnosis not present

## 2016-06-04 NOTE — Progress Notes (Signed)
Geriatrics Visit Note  Provider: Dr McDiarmid/ Dr Lajuana Ripple Location of Care: [facility]  Patient is accompanied by patient.  Primary caregiver is patient and daughter. Patient information was obtained from patient. History/Exam limitations: none.  Chief Complaint:  Chief Complaint  Patient presents with  . 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  The family and the patient identify problems with difficulty remembering why she went into a room or forgetting that she read a book before.  She reports that she is an avid reader and that this concerns her.  Additionally she reports difficulty hearing words despite adequate volume.  She is unable to make out words sometimes.  Additionally, she reports long term use of steroids for hives, which she sees an allergist for.  She notes that she will fatigue easily, intermittently, citing that she feels like grooming could take half her day up sometimes.  She is worried that her adrenals are insufficient and would like to be tested for this.  She also reports bilateral foot pain 2/2 numbness at night time.  She takes Tramadol for this and wonders if this is ok.  She does report good relief of her feet pain with medication.  Denies adverse side effects.  Outpatient Encounter Prescriptions as of 06/04/2016  Medication Sig  . doxepin (SINEQUAN) 10 MG capsule Take 3 capsules (30 mg total) by mouth at bedtime.  Marland Kitchen EPINEPHrine (EPIPEN 2-PAK) 0.3 mg/0.3 mL IJ SOAJ injection Inject 0.3 mg into the muscle once. Reported on 08/30/2015  . Multiple Vitamins-Minerals (ONE-A-DAY 50 PLUS PO) Take by mouth daily.  . predniSONE (DELTASONE) 10 MG tablet Take as directed. Alternate 10mg  one day then 5mg  the next.  . predniSONE (DELTASONE) 5 MG tablet Take as directed. Alternate every other  day with 10mg .  . ranitidine (ZANTAC) 150 MG tablet Take one tablet twice a day.  . traMADol (ULTRAM) 50 MG tablet Take 1 tablet (50 mg total) by mouth daily as needed (pain in legs).   No facility-administered encounter medications on file as of 06/04/2016.     History Past Medical History:  Diagnosis Date  . Angioedema   . Depression   . Dysphagia, pharyngoesophageal phase 07/07/2012  . Left hip pain 07/22/2011  . Leg swelling 11/02/2012  . Sciatica 08/21/2011   Started recently in left hip. Takes tramadol. Does not want surgery. Happens once a week or so.    . Sciatica 04/19/2013  . Sweating 01/21/2012   Might relate to Doxepin   . Urticaria    Past Surgical History:  Procedure Laterality Date  . ABDOMINAL HYSTERECTOMY    . MASTECTOMY, PARTIAL  2009   No family history on file.  reports that she quit smoking about 20 years ago. She has never used smokeless tobacco. She reports that she drinks about 1.2 oz of alcohol per week . She reports that she does not use drugs.  Activities of Daily Living - Current Assessment walk without assistance; Able to perform the following independently: eating, bathing, toileting, personal cares, ambulating, grooming, hygiene, dressing upper body, dressing lower body, meal preparation and taking own medications.  Medication administration: patient self medicates.  Overall functional status:  Self-care  Comments:  Self discontinued driving despite passing vision exam at [facility].  Vitals:   06/04/16 1425  BP: 118/60  Pulse: 63  Temp: 98.3 F (  36.8 C)   Filed Weights   06/04/16 1425  Weight: 160 lb (72.6 kg)   Vision: R 20/30, L 20/40; Bilateral 20/40  Review of Systems Per HPI  PHYSICAL EXAM: General: No acute distress, well nourished, pleasant CV:  Rate regular RESP: No resp distress or accessory muscle use. Gait:  Able to ambulate alone, good balance Skin:  No significant skin lesions or rash Psych:  Fully oriented, Judgment and insight  normal, Memory normal for long and short term, Mood and affect appropriate  MMSE:   MMSE - Mini Mental State Exam 09/28/2012 12/15/2010  Orientation to time 5 5  Orientation to Place 5 5  Registration 3 3  Attention/ Calculation 5 5  Recall 3 2  Language- name 2 objects 2 2  Language- repeat 1 1  Language- follow 3 step command 3 3  Language- read & follow direction 1 1  Write a sentence 1 1  Copy design 1 1  Total score 30 29   MoCA: Montreal Cognitive Assessment  06/04/2016  Visuospatial/ Executive (0/5) 3  Naming (0/3) 3  Attention: Read list of digits (0/2) 2  Attention: Read list of letters (0/1) 0  Attention: Serial 7 subtraction starting at 100 (0/3) 3  Language: Repeat phrase (0/2) 1  Language : Fluency (0/1) 1  Abstraction (0/2) 2  Delayed Recall (0/5) 3  Orientation (0/6) 6  Total 24  Adjusted Score (based on education) 24   Assessment and Plan:   Problem List Items Addressed This Visit      Nervous and Auditory   Impairment of auditory discrimination     Other   Age-related cognitive decline - Primary   Current chronic use of systemic steroids   Relevant Orders   Cortisol    Other Visit Diagnoses    Low energy       Relevant Orders   Cortisol     1. Age-related cognitive decline.  MoCA score of 24.  Discussed that medication is not recommended.    2. Low energy.  Possibly related to chronic use of oral steroids.  She does not seem to be depressed.   - Cortisol; Future.  Patient to come in for a morning cortisol.  3. Current chronic use of systemic steroids - Cortisol; Future  4. Impairment of auditory discrimination, unspecified laterality - Reassurance.   - Discussed some hearing devices that are available to help with this.  Patient has a Living Will.  We do not have a copy of this in her chart but have requested a copy from patient.  Medications were reviewed.  Doxepin at 30mg /d can be highly anticholinergic/ sedating.  Would consider  alternatives/ lowered dosing if possible.  Chloe Lopez M. Lajuana Ripple, DO PGY-3, [facility]

## 2016-06-05 NOTE — Progress Notes (Signed)
I have interviewed and examined the patient with Dr Lajuana Ripple I agree with their assessments and plans as documented in their visit note.   A/  Age-related cognitive decline Peripheral neuropathy, idiopathic - Good symptom control with prn use of tramaol - Patient using tramadol sparingly.  - Given effectiveness of tramadol and greater safety in low to moderate doses in a quite healthy older adult, I believe that it is a relatively safe and effective medication that Chloe Lopez should consider continuing.  This opinion was conveyed to Chloe Lopez.  Chronic Urticaria Chronic Systemic Corticosteroid Therapy - Indication is patient's chronic Urticaria - Chloe Lopez is concerned that the Prednisone is suppressing her adrenal gland function which causes intermittent day-long periods of exhaustion.  CMET in 02/17 showed normal serum potassium and serum sodium levels.   P/ Continue engagement with cognitively challenging activities such as reading, word puzzles, and complex tasks like cooking and gardening.  Physical exercise has benefits with slowing functional decline of aging and improve psychological symptoms of depression/anxiety common to aging.   Agree with continued prn use of tramadol for neuropathic pain of peripheral neuropathy.    Given that the risk of Serotonin Syndrome is not just related to the number of meds affecting the serotonin system, the also the doses of those medications, it seems clinically reasonable to continue this relatively low dose of Doxepin given its dual function of anticholinergic effect for the urticaria and the norepinephrine effect on her peripheral neuropathy pain.   While it is very likely that the prednisone has suppressed her adrenal gland secretion of cortisol, it may be helpful to see the ability of the adrenal at time of usual cortisol maximum production.  Pt to return for a morning fasting cortisol level.   Will check Vit B12   30  minutes face to face where spent  in total with counseling / coordination of care took more than 50% of the total time. Counseling involved discussion with patient means of preserving or slowly decline in cognitive and physical function with aging.  Discussed risks and benefits of her neuropathy and urticaia therapies.

## 2016-06-11 ENCOUNTER — Ambulatory Visit: Payer: Medicare Other | Admitting: Family Medicine

## 2016-08-18 ENCOUNTER — Ambulatory Visit (INDEPENDENT_AMBULATORY_CARE_PROVIDER_SITE_OTHER): Payer: Medicare Other | Admitting: Allergy and Immunology

## 2016-08-18 ENCOUNTER — Encounter: Payer: Self-pay | Admitting: Allergy and Immunology

## 2016-08-18 VITALS — BP 118/74 | HR 72 | Resp 16

## 2016-08-18 DIAGNOSIS — L501 Idiopathic urticaria: Secondary | ICD-10-CM | POA: Diagnosis not present

## 2016-08-18 MED ORDER — PREDNISONE 5 MG PO TABS: ORAL_TABLET | ORAL | 1 refills | Status: DC

## 2016-08-18 MED ORDER — PREDNISONE 10 MG PO TABS: ORAL_TABLET | ORAL | 1 refills | Status: DC

## 2016-08-18 MED ORDER — DOXEPIN HCL 10 MG PO CAPS: 30.0000 mg | ORAL_CAPSULE | Freq: Every day | ORAL | 1 refills | Status: AC

## 2016-08-18 MED ORDER — RANITIDINE HCL 150 MG PO TABS: ORAL_TABLET | ORAL | 1 refills | Status: AC

## 2016-08-18 NOTE — Patient Instructions (Signed)
  1. Continue Prednisone 10 mg alternating with 5 mg every other day    2. Continue doxepin 30 mg in the evening and ranitidine 150 mg twice a day  3. Can add Benadryl if needed  4. EpiPen if needed  5. Return in 6 months or earlier if problem  6. Further treatment for hives?

## 2016-08-18 NOTE — Progress Notes (Signed)
Follow-up Note  Referring Provider: Leeanne Rio, MD Primary Provider: Chrisandra Netters, MD Date of Office Visit: 08/18/2016  Subjective:   Chloe Lopez (DOB: January 21, 1924) is a 83 y.o. female who returns to [facility] on 08/18/2016 in re-evaluation of the following:  HPI: Chloe Lopez returns to this clinic in reevaluation of her recurrent urticaria and angioeema. I have not seen her in this clinic since August 2017.   She has done relatively well while consistently using systemic steroids and doxepin and ranitidine. She will have an occasional periorbital swelling or a lip swelling at a frequency of less than 1 time per month. However, 2 days ago she developed global urticaria that lasted about 24 hours and was not associated with any systemic or constitutional symptoms and without any obvious trigger. She ended up taking 30 mg of prednisone and Benadryl and she has not had any hives noted today.   Allergies as of 08/18/2016      Reactions   Azithromycin Swelling   Reportedly had reaction September 2017 - caused worsening angioedema. Unclear if it was this or the benzonatate she started at the same time.   Benzonatate Swelling   Reportedly had reaction September 2017 - caused worsening angioedema. Unclear if it was this or the azithromycin she started at the same time.      Medication List      doxepin 10 MG capsule Commonly known as:  SINEQUAN Take 3 capsules (30 mg total) by mouth at bedtime.   EPIPEN 2-PAK 0.3 mg/0.3 mL Soaj injection Generic drug:  EPINEPHrine Inject 0.3 mg into the muscle once. Reported on 08/30/2015   ONE-A-DAY 50 PLUS PO Take by mouth daily.   predniSONE 10 MG tablet Commonly known as:  DELTASONE Take as directed. Alternate 10mg  one day then 5mg  the next.   predniSONE 5 MG tablet Commonly known as:  DELTASONE Take as directed. Alternate every other day with 10mg .   ranitidine 150 MG tablet Commonly known as:  ZANTAC Take  one tablet twice a day.   traMADol 50 MG tablet Commonly known as:  ULTRAM Take 1 tablet (50 mg total) by mouth daily as needed (pain in legs).       Past Medical History:  Diagnosis Date  . Angioedema   . Depression   . Dysphagia, pharyngoesophageal phase 07/07/2012  . Left hip pain 07/22/2011  . Leg swelling 11/02/2012  . Sciatica 08/21/2011   Started recently in left hip. Takes tramadol. Does not want surgery. Happens once a week or so.    . Sciatica 04/19/2013  . Sweating 01/21/2012   Might relate to Doxepin   . Urticaria     Past Surgical History:  Procedure Laterality Date  . ABDOMINAL HYSTERECTOMY    . MASTECTOMY, PARTIAL  2009    Review of systems negative except as noted in HPI / PMHx or noted below:  Review of Systems  Constitutional: Negative.   HENT: Negative.   Eyes: Negative.   Respiratory: Negative.   Cardiovascular: Negative.   Gastrointestinal: Negative.   Genitourinary: Negative.   Musculoskeletal: Negative.   Skin: Negative.   Neurological: Negative.   Endo/Heme/Allergies: Negative.   Psychiatric/Behavioral: Negative.      Objective:   Vitals:   08/18/16 1116  BP: 118/74  Pulse: 72  Resp: 16          Physical Exam  Constitutional: She is well-developed, well-nourished, and in no distress.  HENT:  Head: Normocephalic.  Right Ear: Tympanic membrane, external ear and ear canal normal.  Left Ear: Tympanic membrane, external ear and ear canal normal.  Nose: Nose normal. No mucosal edema or rhinorrhea.  Mouth/Throat: Uvula is midline, oropharynx is clear and moist and mucous membranes are normal. No oropharyngeal exudate.  Eyes: Conjunctivae are normal.  Neck: Trachea normal. No tracheal tenderness present. No tracheal deviation present. No thyromegaly present.  Cardiovascular: Normal rate, regular rhythm, S1 normal, S2 normal and normal heart sounds.   No murmur heard. Pulmonary/Chest: Breath sounds normal. No stridor. No respiratory  distress. She has no wheezes. She has no rales.  Musculoskeletal: She exhibits no edema.  Lymphadenopathy:       Head (right side): No tonsillar adenopathy present.       Head (left side): No tonsillar adenopathy present.    She has no cervical adenopathy.  Neurological: She is alert. Gait normal.  Skin: No rash noted. She is not diaphoretic. No erythema. Nails show no clubbing.  Psychiatric: Mood and affect normal.    Diagnostics: none   Assessment and Plan:   1. Idiopathic urticaria     1. Continue Prednisone 10 mg alternating with 5 mg every other day    2. Continue doxepin 30 mg in the evening and ranitidine 150 mg twice a day  3. Can add Benadryl if needed  4. EpiPen if needed  5. Return in 6 months or earlier if problem  6. Further treatment for hives?   Overall Chloe Lopez is doing relatively well and she will continue on the therapy mentioned above for her immunological hyperreactivity manifested as recurrent urticaria and angioedema. I had a talk with her in the past and once again today about the possibility of using omalizumab to treat her immunological hyperreactivity but she is not very interested in undergoing this treatment at this point in time. She would rather just stay on her alternating dose of prednisone and doxepin and ranitidine. Certainly if this plan does not result in good control of her immunological hyperactivity she will require further evaluation. I will see her back in this clinic in 6 months or earlier if there is a problem.  Allena Katz, MD Allergy / Immunology [place]

## 2016-08-19 DIAGNOSIS — L821 Other seborrheic keratosis: Secondary | ICD-10-CM | POA: Diagnosis not present

## 2016-08-25 ENCOUNTER — Ambulatory Visit (HOSPITAL_COMMUNITY)
Admission: RE | Admit: 2016-08-25 | Discharge: 2016-08-25 | Disposition: A | Discharge status: Home / Self Care | Payer: Medicare Other | Source: Ambulatory Visit | Attending: Family Medicine | Admitting: Family Medicine

## 2016-08-25 ENCOUNTER — Other Ambulatory Visit: Payer: Self-pay

## 2016-08-25 ENCOUNTER — Encounter (HOSPITAL_COMMUNITY): Payer: Self-pay | Admitting: Emergency Medicine

## 2016-08-25 ENCOUNTER — Observation Stay (HOSPITAL_COMMUNITY)
Admission: EM | Admit: 2016-08-25 | Discharge: 2016-08-26 | Disposition: A | Discharge status: Home / Self Care | Payer: Medicare Other | Attending: Family Medicine | Admitting: Family Medicine

## 2016-08-25 ENCOUNTER — Emergency Department (HOSPITAL_COMMUNITY): Payer: Medicare Other

## 2016-08-25 ENCOUNTER — Ambulatory Visit (INDEPENDENT_AMBULATORY_CARE_PROVIDER_SITE_OTHER): Payer: Medicare Other | Admitting: Family Medicine

## 2016-08-25 ENCOUNTER — Encounter: Payer: Self-pay | Admitting: Family Medicine

## 2016-08-25 VITALS — BP 104/80 | HR 70 | Temp 98.4°F | Ht 66.0 in | Wt 153.0 lb

## 2016-08-25 DIAGNOSIS — G6289 Other specified polyneuropathies: Secondary | ICD-10-CM | POA: Diagnosis not present

## 2016-08-25 DIAGNOSIS — I499 Cardiac arrhythmia, unspecified: Secondary | ICD-10-CM

## 2016-08-25 DIAGNOSIS — L509 Urticaria, unspecified: Secondary | ICD-10-CM | POA: Diagnosis not present

## 2016-08-25 DIAGNOSIS — M8580 Other specified disorders of bone density and structure, unspecified site: Secondary | ICD-10-CM | POA: Diagnosis not present

## 2016-08-25 DIAGNOSIS — L821 Other seborrheic keratosis: Secondary | ICD-10-CM

## 2016-08-25 DIAGNOSIS — M5430 Sciatica, unspecified side: Secondary | ICD-10-CM | POA: Diagnosis not present

## 2016-08-25 DIAGNOSIS — Z79899 Other long term (current) drug therapy: Secondary | ICD-10-CM | POA: Insufficient documentation

## 2016-08-25 DIAGNOSIS — H93299 Other abnormal auditory perceptions, unspecified ear: Secondary | ICD-10-CM | POA: Diagnosis not present

## 2016-08-25 DIAGNOSIS — T783XXA Angioneurotic edema, initial encounter: Secondary | ICD-10-CM | POA: Insufficient documentation

## 2016-08-25 DIAGNOSIS — I4891 Unspecified atrial fibrillation: Secondary | ICD-10-CM | POA: Diagnosis not present

## 2016-08-25 DIAGNOSIS — Z87891 Personal history of nicotine dependence: Secondary | ICD-10-CM | POA: Insufficient documentation

## 2016-08-25 DIAGNOSIS — Z66 Do not resuscitate: Secondary | ICD-10-CM | POA: Diagnosis not present

## 2016-08-25 DIAGNOSIS — X58XXXA Exposure to other specified factors, initial encounter: Secondary | ICD-10-CM | POA: Diagnosis not present

## 2016-08-25 DIAGNOSIS — Z7952 Long term (current) use of systemic steroids: Secondary | ICD-10-CM | POA: Diagnosis not present

## 2016-08-25 DIAGNOSIS — Z853 Personal history of malignant neoplasm of breast: Secondary | ICD-10-CM | POA: Insufficient documentation

## 2016-08-25 DIAGNOSIS — Z888 Allergy status to other drugs, medicaments and biological substances status: Secondary | ICD-10-CM | POA: Diagnosis not present

## 2016-08-25 DIAGNOSIS — R000 Tachycardia, unspecified: Secondary | ICD-10-CM | POA: Diagnosis not present

## 2016-08-25 LAB — CBC
HCT: 47.9 % — ABNORMAL HIGH (ref 36.0–46.0)
HEMOGLOBIN: 15.9 g/dL — AB (ref 12.0–15.0)
MCH: 31.2 pg (ref 26.0–34.0)
MCHC: 33.2 g/dL (ref 30.0–36.0)
MCV: 94.1 fL (ref 78.0–100.0)
PLATELETS: 283 10*3/uL (ref 150–400)
RBC: 5.09 MIL/uL (ref 3.87–5.11)
RDW: 14.4 % (ref 11.5–15.5)
WBC: 8.9 10*3/uL (ref 4.0–10.5)

## 2016-08-25 LAB — TROPONIN I: Troponin I: <0.03 ng/mL (ref <0.03)

## 2016-08-25 LAB — BASIC METABOLIC PANEL
Anion gap: 9 (ref 5–15)
BUN: 14 mg/dL (ref 6–20)
CALCIUM: 9.6 mg/dL (ref 8.9–10.3)
CO2: 22 mmol/L (ref 22–32)
CREATININE: 0.75 mg/dL (ref 0.44–1.00)
Chloride: 108 mmol/L (ref 101–111)
GFR calc non Af Amer: >60 mL/min (ref >60)
GLUCOSE: 137 mg/dL — AB (ref 65–99)
Potassium: 4.3 mmol/L (ref 3.5–5.1)
Sodium: 139 mmol/L (ref 135–145)

## 2016-08-25 LAB — TSH: TSH: 1.387 u[IU]/mL (ref 0.350–4.500)

## 2016-08-25 LAB — I-STAT TROPONIN, ED: TROPONIN I, POC: 0 ng/mL (ref 0.00–0.08)

## 2016-08-25 MED ORDER — FAMOTIDINE 20 MG PO TABS
20.0000 mg | ORAL_TABLET | Freq: Two times a day (BID) | ORAL | Status: DC
  Administered 2016-08-25 – 2016-08-26 (×2): 20 mg via ORAL
  Filled 2016-08-25 (×3): qty 1

## 2016-08-25 MED ORDER — DILTIAZEM HCL-DEXTROSE 100-5 MG/100ML-% IV SOLN (PREMIX)
5.0000 mg/h | INTRAVENOUS | Status: AC
  Administered 2016-08-25: 5 mg/h via INTRAVENOUS
  Filled 2016-08-25: qty 100

## 2016-08-25 MED ORDER — DOXEPIN HCL 10 MG PO CAPS
30.0000 mg | ORAL_CAPSULE | Freq: Every day | ORAL | Status: DC
  Administered 2016-08-25: 30 mg via ORAL
  Filled 2016-08-25: qty 3

## 2016-08-25 MED ORDER — DILTIAZEM HCL 30 MG PO TABS
30.0000 mg | ORAL_TABLET | Freq: Four times a day (QID) | ORAL | Status: DC
  Administered 2016-08-25 – 2016-08-26 (×4): 30 mg via ORAL
  Filled 2016-08-25 (×4): qty 1

## 2016-08-25 MED ORDER — ACETAMINOPHEN 325 MG PO TABS: 650.0000 mg | ORAL_TABLET | ORAL | Status: DC | PRN

## 2016-08-25 MED ORDER — PREDNISONE 5 MG PO TABS
5.0000 mg | ORAL_TABLET | ORAL | Status: DC
  Administered 2016-08-26: 5 mg via ORAL
  Filled 2016-08-25: qty 1

## 2016-08-25 MED ORDER — DILTIAZEM LOAD VIA INFUSION
20.0000 mg | Freq: Once | INTRAVENOUS | Status: AC
  Administered 2016-08-25: 20 mg via INTRAVENOUS
  Filled 2016-08-25: qty 20

## 2016-08-25 MED ORDER — PREDNISONE 10 MG PO TABS
10.0000 mg | ORAL_TABLET | ORAL | Status: DC
Start: 2016-08-25 — End: 2016-08-26
  Filled 2016-08-25: qty 1

## 2016-08-25 MED ORDER — ONDANSETRON HCL 4 MG/2ML IJ SOLN: 4.0000 mg | Freq: Four times a day (QID) | INTRAMUSCULAR | Status: DC | PRN

## 2016-08-25 MED ORDER — TRAMADOL HCL 50 MG PO TABS: 50.0000 mg | ORAL_TABLET | Freq: Every day | ORAL | Status: DC | PRN

## 2016-08-25 MED ORDER — APIXABAN 5 MG PO TABS
5.0000 mg | ORAL_TABLET | Freq: Two times a day (BID) | ORAL | Status: DC
  Administered 2016-08-25 – 2016-08-26 (×2): 5 mg via ORAL
  Filled 2016-08-25 (×2): qty 1

## 2016-08-25 NOTE — Progress Notes (Signed)
Pt arrived to 2w from [facility] ED. Pt oriented to room. Vitals obtained. Telemetry box applied and CCMD notified. Pt updated on plan of care. Admission questions completed. Dinner tray ordered.  Grant Fontana BSN, RN

## 2016-08-25 NOTE — Assessment & Plan Note (Signed)
Well controlled on tramadol at night as needed. Had planned to refill today, but more pressing issue was her afib with RVR, which prompted urgent transfer to the ED.

## 2016-08-25 NOTE — ED Notes (Signed)
Attempted report 

## 2016-08-25 NOTE — Progress Notes (Signed)
Date of Visit: 08/25/2016   HPI:  Patient presents today for routine follow up. She has no acute issues to discuss at the start of our visit.  Chronic urticaria/angioedema - has followed with Dr. Carmelina Peal. Doing well. Alternating prednisone 5mg  with 10mg  daily. Also taking doxepin 30mg  at night.   Rash on back - has several raised moles/lesions on her back. Follows with Dr. Wilhemina Bonito of dermatology and plans to schedule follow up with him. Believes these lesions are new in the last few months.  Peripheral neuropathy - takes tramadol occasionally at night to help her sleep. Needs refill of this today.  Atrial fibrillation - noted this visit on examination, confirmed with EKG. No history of heart issues in the past. Denies chest pain, shortness of breath, lightheadedness/dizziness, or palpitations.  ROS: See HPI.   [place]: history of breast cancer, hives/angioedema, osteopenia, venous insufficiency  PHYSICAL EXAM: BP 104/80   Pulse 70   Temp 98.4 F (36.9 C) (Oral)   Ht 5\' 6"  (1.676 m)   Wt 153 lb (69.4 kg)   SpO2 95%   BMI 24.69 kg/m  Gen: no acute distress, pleasant, cooperative HEENT: normocephalic, atraumatic, moist mucous membranes  Heart: irregularly irregular and tachycardic both on auscultation and palpation of pulse Lungs: clear to auscultation bilaterally normal work of breathing  Neuro: alert, grossly nonfocal, speech normal Ext: No appreciable lower extremity edema bilaterally  Skin: many seborrheic keratoses on back in [employer] distribution  ASSESSMENT/PLAN:  Atrial fibrillation ([place]) Noted on auscultation of patient's heart that rhythm was irregular and rate quite tachycardic, confirmed on peripheral pulse as well. Patient is entirely asymptomatic. EKG obtained which shows atrial fibrillation with RVR, heart rate in 150's. Unclear how long she's been in afib with RVR given her lack of symptoms.  Anticipate need for hospital admission, however with RVR patient  not stable for direct admission to the hospital. Discussed recommendation with patient that she be seen in ED urgently to control heart rate. She was agreeable. EMS called and transported patient to ED. Daughter present, aware of this, and will accompany patient to ED.  URTICARIA-HIVES/Angioedema Continue follow up with Dr. Carmelina Peal.  Seborrheic keratoses Many SK's on her back, apparently new per patient. I am not sure how new these really are, but it is certainly worth evaluation, as if she has true new onset of multiple SK's then this could be concerning for underlying malignancy (sign of Leser Trelat). She will schedule follow up with her dermatologist. We did not discuss potential for underlying malignancy, as the greater more pressing issue during this visit was her newly discovered afib with RVR. Will plan to bring this up when I see her next.  Peripheral neuropathy (HCC) Well controlled on tramadol at night as needed. Had planned to refill today, but more pressing issue was her afib with RVR, which prompted urgent transfer to the ED.    [place]. Ardelia Mems, [place]

## 2016-08-25 NOTE — Assessment & Plan Note (Addendum)
Noted on auscultation of patient's heart that rhythm was irregular and rate quite tachycardic, confirmed on peripheral pulse as well. Patient is entirely asymptomatic. EKG obtained which shows atrial fibrillation with RVR, heart rate in 150's. Unclear how long she's been in afib with RVR given her lack of symptoms.  Anticipate need for hospital admission, however with RVR patient not stable for direct admission to the hospital. Discussed recommendation with patient that she be seen in ED urgently to control heart rate. She was agreeable. EMS called and transported patient to ED. Daughter present, aware of this, and will accompany patient to ED.

## 2016-08-25 NOTE — ED Provider Notes (Signed)
[facility] DEPT Provider Note   CSN: [id number] Arrival date & time: 08/25/16  1009     History   Chief Complaint Chief Complaint  Patient presents with  . Atrial Fibrillation    HPI Chloe Lopez is a 83 y.o. female.  Pt presents to the ED today from her doctor's office for a.fib with RVR.  The pt went to her doctor's office for a routine visit.  When she arrived, she was in a.fib with RVR.  HR 154.  The pt has no sx from the a.fib and has no idea when it started.  The pt has never had a hx of a.fib.        Past Medical History:  Diagnosis Date  . Angioedema   . Depression   . Dysphagia, pharyngoesophageal phase 07/07/2012  . Left hip pain 07/22/2011  . Leg swelling 11/02/2012  . Sciatica 08/21/2011   Started recently in left hip. Takes tramadol. Does not want surgery. Happens once a week or so.    . Sciatica 04/19/2013  . Sweating 01/21/2012   Might relate to Doxepin   . Urticaria     Patient Active Problem List   Diagnosis Date Noted  . Age-related cognitive decline 06/04/2016  . Current chronic use of systemic steroids 06/04/2016  . 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 Varicose veins of both lower extremities with complications 02/54/2706  . Dysthymia 07/15/2015  . Open angle with borderline findings, low risk, bilateral 03/25/2015  . Peripheral neuropathy 09/10/2014  . Venous (peripheral) insufficiency 11/10/2012  . Hearing loss 11/10/2012  . AMD (age related macular degeneration) 11/23/2011  . Pseudophakia 11/23/2011  . History of breast cancer in female 03/25/2009  . URTICARIA-HIVES/Angioedema 07/01/2006  . OSTEOPENIA 07/01/2006    Past Surgical History:  Procedure Laterality Date  . ABDOMINAL HYSTERECTOMY    . MASTECTOMY, PARTIAL  2009    OB History    No data available       Home Medications    Prior to Admission medications   Medication Sig Start Date End Date Taking? Authorizing Provider  doxepin (SINEQUAN) 10 MG capsule  Take 3 capsules (30 mg total) by mouth at bedtime. 08/18/16   Jiles Prows, MD  EPINEPHrine (EPIPEN 2-PAK) 0.3 mg/0.3 mL IJ SOAJ injection Inject 0.3 mg into the muscle once. Reported on 08/30/2015    Historical Provider, MD  Multiple Vitamins-Minerals (ONE-A-DAY 50 PLUS PO) Take by mouth daily.    Historical Provider, MD  predniSONE (DELTASONE) 10 MG tablet Take as directed. Alternate 10mg  one day then 5mg  the next. 08/18/16   Jiles Prows, MD  predniSONE (DELTASONE) 5 MG tablet Take as directed. Alternate every other day with 10mg . 08/18/16   Jiles Prows, MD  ranitidine (ZANTAC) 150 MG tablet Take one tablet twice a day. 08/18/16   Jiles Prows, MD  traMADol (ULTRAM) 50 MG tablet Take 1 tablet (50 mg total) by mouth daily as needed (pain in legs). 05/19/16   Leeanne Rio, MD    Family History History reviewed. No pertinent family history.  Social History Social History  Substance Use Topics  . Smoking status: Former Smoker    Quit date: 12/15/1995  . Smokeless tobacco: Never Used  . Alcohol use 1.2 oz/week    2 Standard drinks or equivalent per week     Allergies   Azithromycin and Benzonatate   Review of Systems Review of Systems  All other systems reviewed and are negative.  Physical Exam Updated Vital Signs BP 126/87   Pulse 91   Temp 98.1 F (36.7 C) (Oral)   Resp 20   Ht 5\' 6"  (1.676 m)   Wt 153 lb (69.4 kg)   SpO2 95%   BMI 24.69 kg/m   Physical Exam  Constitutional: She is oriented to person, place, and time. She appears well-developed and well-nourished.  HENT:  Head: Normocephalic and atraumatic.  Right Ear: External ear normal.  Left Ear: External ear normal.  Nose: Nose normal.  Mouth/Throat: Oropharynx is clear and moist.  Eyes: Conjunctivae and EOM are normal. Pupils are equal, round, and reactive to light.  Neck: Normal range of motion. Neck supple.  Cardiovascular: Intact distal pulses.  An irregularly irregular rhythm present.    Pulmonary/Chest: Effort normal and breath sounds normal.  Abdominal: Soft. Bowel sounds are normal.  Musculoskeletal: Normal range of motion.  Neurological: She is alert and oriented to person, place, and time.  Skin: Skin is warm and dry. Capillary refill takes less than 2 seconds.  Psychiatric: She has a normal mood and affect. Her behavior is normal. Judgment and thought content normal.  Nursing note and vitals reviewed.    ED Treatments / Results  Labs (all labs ordered are listed, but only abnormal results are displayed) Labs Reviewed  BASIC METABOLIC PANEL - Abnormal; Notable for the following:       Result Value   Glucose, Bld 137 (*)    All other components within normal limits  CBC - Abnormal; Notable for the following:    Hemoglobin 15.9 (*)    HCT 47.9 (*)    All other components within normal limits  TSH  I-STAT TROPOININ, ED    EKG  EKG Interpretation  Date/Time:  Tuesday August 25 2016 10:57:31 EDT Ventricular Rate:  92 PR Interval:    QRS Duration: 85 QT Interval:  415 QTC Calculation: 514 R Axis:   72 Text Interpretation:  Atrial fibrillation Ventricular premature complex Prolonged QT interval after cardizem Confirmed by Shyane Fossum MD, Matty Vanroekel ([id number]) on 08/25/2016 11:37:40 AM      1st EKG:  Not coming through on MUSE  HR 141.  Afib with rvr  Radiology Dg Chest Port 1 View  Result Date: 08/25/2016 CLINICAL DATA:  Atrial fibrillation EXAM: PORTABLE CHEST 1 VIEW COMPARISON:  06/14/2009 FINDINGS: Mild cardiomegaly is stable. Chronic aortic tortuosity. Chronic interstitial coarsening, likely bronchitic. No Kerley line or air bronchogram. No effusion or pneumothorax. 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 IMPRESSION: No acute finding when compared to priors. Electronically Signed   By: Monte Fantasia M.D.   On: 08/25/2016 10:57    Procedures Procedures (including critical care time)  Medications  Ordered in ED Medications  diltiazem (CARDIZEM) 1 mg/mL load via infusion 20 mg (20 mg Intravenous Bolus from Bag 08/25/16 1034)    And  diltiazem (CARDIZEM) 100 mg in dextrose 5% 171mL (1 mg/mL) infusion (5 mg/hr Intravenous New Bag/Given 08/25/16 1039)     Initial Impression / Assessment and Plan / ED Course  I have reviewed the triage vital signs and the nursing notes.  Pertinent labs & imaging results that were available during my care of the patient were reviewed by me and considered in my medical decision making (see chart for details).    Pt given cardizem bolus which helped hr, but it is still high, so she was put on a drip.  This has controlled her hr, but she  is still in a.fib.  Pt d/w FP resident who will see pt.   Final Clinical Impressions(s) / ED Diagnoses   Final diagnoses:  Atrial fibrillation with RVR [facility])    New Prescriptions New Prescriptions   No medications on file     Isla Pence, MD 08/25/16 1305

## 2016-08-25 NOTE — Assessment & Plan Note (Addendum)
Many SK's on her back, apparently new per patient. I am not sure how new these really are, but it is certainly worth evaluation, as if she has true new onset of multiple SK's then this could be concerning for underlying malignancy (sign of Leser Trelat). She will schedule follow up with her dermatologist. We did not discuss potential for underlying malignancy, as the greater more pressing issue during this visit was her newly discovered afib with RVR. Will plan to bring this up when I see her next.

## 2016-08-25 NOTE — Assessment & Plan Note (Signed)
Continue follow up with Dr. Carmelina Peal.

## 2016-08-25 NOTE — ED Triage Notes (Signed)
Pt from PCP office via [facility] in afib-RVR with no hx of the same.  Pt denies any complaints, no pain, no SOB, no N/V.  NAD, A&O.

## 2016-08-25 NOTE — H&P (Signed)
[facility] Admission History and Physical Service Pager: [phone]  Patient name: Chloe Lopez Medical record number: [id number] Date of birth: 1923/10/19 Age: 83 y.o. Gender: female  Primary Care Provider: Chrisandra Netters, MD Consultants: None Code Status: DNR (per discussion on admission)   Chief Complaint: "rapid heart rate"  Assessment and Plan: Chloe Lopez is a 83 y.o. female presenting with fast heart rate. PMH is significant for h/o breast CA s/p lumpectomy, recurrent urticaria and angioedema, osteopenia, sciatica, age related cognitive decline.  New onset A fib w/ RVR: Patient found to be in Afib RVR on EKG at regularly scheduled PCP visit while asymptomatic. No chest pain, palpitations, lightheadedness, SOB. On presentation HR up to 154, started on diltiazem bolus and then gtt in the ED for rate control. Repeat EKG showing Afib appropriately rate controlled. POC trop neg. CXR unremarkable. Normal kidney function and electrolytes. No anemia (hgb 15.9). CHADsVASC 3 (age, sex). HAS-BLED 1.  - Place in observation, telemetry floor, attending Dr. Erin Hearing - continue diltiazem gtt, anticipate transition to po diltiazem today - start Eliquis 5mg  BID - monitor on telemetry - Trend troponins - Risk stratification labs: TSH 1.387 WNL. Check a1c and lipid panel - am EKG - Echo - Consult cardiology if patient goes back into A fib with RVR or if hemodynamically unstable  Chronic steroid use: On steroids for recurrent urticaria and angioedema. Follows with allergist.  - continue home prednisone daily, alternating 10mg  and 5mg  every other day  Recurrent urticaria and angioedema: chronic prednisone as above. Additionally on doxepin and ranitidine per allergist - Continue home medications  Sciatica Pain: Notes that she has some tingling pain that radiates to her left ankle at night. Take Tramadol intermittently for this.  - Continue home Tramadol PRN  Hx  of breast cancer: s/p partial mastectomy on left breast. Patient notes she has had follow up mammograms that were normal. No chemo or radiation  Hx of Age related cognitive decline: MoCA score of 24 in Feb 2018. Patient is oriented x3.  FEN/GI: regular diet, ranitidine Prophylaxis: eliquis  Disposition: Admit to FPTS, telemetry floor, attending Dr. Erin Hearing  History of Present Illness:  Chloe Lopez is a 83 y.o. female presenting with A fib with RVR.   Patient notes she went to her PCP earlier today for regularly scheduled follow up. When getting her vital signs checked by the CMA she noted that her "heart was racing" on the monitor but patient did not actually feel her heart racing. In fact, she was completely asymptomatic. Denies any chest pain, shortness of breath, palpitations, numbness, tingling, nausea, vomiting, abdominal pain, diarrhea, constipation.   Patient notes that she lives alone and is very functional usually. Walks without help and performs her ADLs. She does not drive but rather depends on her daughter for transportation.   ED course: Patient was noted to be in A fib with RVR. She was placed on a Cardizem bolus and then on drip and rate quickly went down to normal. She remained in afib.   Review Of Systems: Per HPI with the following additions: see HPI  Review of Systems  Constitutional: Negative for malaise/fatigue.  Respiratory: Negative for shortness of breath and wheezing.   Cardiovascular: Negative for chest pain and palpitations.  Gastrointestinal: Negative for abdominal pain, constipation, diarrhea, heartburn, nausea and vomiting.  Musculoskeletal: Negative for myalgias.  Neurological: Negative for dizziness, focal weakness, weakness and headaches.    Patient Active Problem List   Diagnosis Date  Noted  . Age-related cognitive decline 06/04/2016  . Current chronic use of systemic steroids 06/04/2016  . 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  Varicose veins of both lower extremities with complications 16/02/9603  . Dysthymia 07/15/2015  . Open angle with borderline findings, low risk, bilateral 03/25/2015  . Peripheral neuropathy 09/10/2014  . Venous (peripheral) insufficiency 11/10/2012  . Hearing loss 11/10/2012  . AMD (age related macular degeneration) 11/23/2011  . Pseudophakia 11/23/2011  . History of breast cancer in female 03/25/2009  . URTICARIA-HIVES/Angioedema 07/01/2006  . OSTEOPENIA 07/01/2006    Past Medical History: Past Medical History:  Diagnosis Date  . Angioedema   . Depression   . Dysphagia, pharyngoesophageal phase 07/07/2012  . Left hip pain 07/22/2011  . Leg swelling 11/02/2012  . Sciatica 08/21/2011   Started recently in left hip. Takes tramadol. Does not want surgery. Happens once a week or so.    . Sciatica 04/19/2013  . Sweating 01/21/2012   Might relate to Doxepin   . Urticaria     Past Surgical History: Past Surgical History:  Procedure Laterality Date  . ABDOMINAL HYSTERECTOMY    . MASTECTOMY, PARTIAL  2009    Social History: Social History  Substance Use Topics  . Smoking status: Former Smoker    Quit date: 12/15/1995  . Smokeless tobacco: Never Used  . Alcohol use 1.2 oz/week    2 Standard drinks or equivalent per week   Denies EtOH use. Patient notes that the last time she smoked was 15 years ago, she did smoke for about 20 years before she quit (she only smoked socially).  No recreational drug use Please also refer to relevant sections of EMR.  Family History: History reviewed. No pertinent family history.  Allergies and Medications: Allergies  Allergen Reactions  . Azithromycin Swelling    Reportedly had reaction September 2017 - caused worsening angioedema. Unclear if it was this or the benzonatate she started at the same time.  . Benzonatate Swelling    Reportedly had reaction September 2017 - caused worsening angioedema. Unclear if it was this or the azithromycin she  started at the same time.   No current facility-administered medications on file prior to encounter.    Current Outpatient Prescriptions on File Prior to Encounter  Medication Sig Dispense Refill  . doxepin (SINEQUAN) 10 MG capsule Take 3 capsules (30 mg total) by mouth at bedtime. 270 capsule 1  . EPINEPHrine (EPIPEN 2-PAK) 0.3 mg/0.3 mL IJ SOAJ injection Inject 0.3 mg into the muscle once. Reported on 08/30/2015    . Multiple Vitamins-Minerals (ONE-A-DAY 50 PLUS PO) Take by mouth daily.    . predniSONE (DELTASONE) 10 MG tablet Take as directed. Alternate 10mg  one day then 5mg  the next. 45 tablet 1  . predniSONE (DELTASONE) 5 MG tablet Take as directed. Alternate every other day with 10mg . 45 tablet 1  . ranitidine (ZANTAC) 150 MG tablet Take one tablet twice a day. 180 tablet 1  . traMADol (ULTRAM) 50 MG tablet Take 1 tablet (50 mg total) by mouth daily as needed (pain in legs). 30 tablet 1  . [DISCONTINUED] DOXEPIN HCL PO Take by mouth. 30 mg  Twice a day      Objective: BP 126/87   Pulse 91   Temp 98.1 F (36.7 C) (Oral)   Resp 20   Ht 5\' 6"  (1.676 m)   Wt 153 lb (69.4 kg)   SpO2 95%   BMI 24.69 kg/m  Exam: General: Pleasant elderly female,  sitting up in bed comfortably, in NAD Eyes: PERRL, EOMI ENTM: MMM, good dentition Neck: supple, normal ROM Cardiovascular: irregularly irregular, normal rate, no murmurs Respiratory: CTAB, normal effort on room air Gastrointestinal: soft, nontender, nondistended, + bowel sounds MSK: normal bulk and tone, normal ROM Derm: warm and dry. Venous insufficiency changes in feet and ankles Neuro: alert, oriented to person/place/time. No focal deficits Psych: appropriate affect  Labs and Imaging: CBC BMET   Recent Labs Lab 08/25/16 1022  WBC 8.9  HGB 15.9*  HCT 47.9*  PLT 283    Recent Labs Lab 08/25/16 1022  NA 139  K 4.3  CL 108  CO2 22  BUN 14  CREATININE 0.75  GLUCOSE 137*  CALCIUM 9.6     POC trop 0.00  TSH  1.387   [name], DO 08/25/2016, 12:24 PM PGY-1, [facility] pager: [phone], text pages welcome  I have separately seen and examined the patient. I have discussed the findings and exam with Dr Shawna Orleans and agree with the above note.  My changes/additions are outlined in BLUE.   Smitty Cords, MD [place], PGY-2

## 2016-08-26 ENCOUNTER — Observation Stay (HOSPITAL_COMMUNITY): Payer: Medicare Other

## 2016-08-26 DIAGNOSIS — Z853 Personal history of malignant neoplasm of breast: Secondary | ICD-10-CM | POA: Diagnosis not present

## 2016-08-26 DIAGNOSIS — Z888 Allergy status to other drugs, medicaments and biological substances status: Secondary | ICD-10-CM | POA: Diagnosis not present

## 2016-08-26 DIAGNOSIS — H93299 Other abnormal auditory perceptions, unspecified ear: Secondary | ICD-10-CM | POA: Diagnosis not present

## 2016-08-26 DIAGNOSIS — Z7952 Long term (current) use of systemic steroids: Secondary | ICD-10-CM | POA: Diagnosis not present

## 2016-08-26 DIAGNOSIS — T783XXA Angioneurotic edema, initial encounter: Secondary | ICD-10-CM | POA: Diagnosis not present

## 2016-08-26 DIAGNOSIS — I4891 Unspecified atrial fibrillation: Secondary | ICD-10-CM | POA: Diagnosis not present

## 2016-08-26 DIAGNOSIS — M8580 Other specified disorders of bone density and structure, unspecified site: Secondary | ICD-10-CM | POA: Diagnosis not present

## 2016-08-26 DIAGNOSIS — Z66 Do not resuscitate: Secondary | ICD-10-CM | POA: Diagnosis not present

## 2016-08-26 DIAGNOSIS — M5430 Sciatica, unspecified side: Secondary | ICD-10-CM | POA: Diagnosis not present

## 2016-08-26 DIAGNOSIS — Z87891 Personal history of nicotine dependence: Secondary | ICD-10-CM | POA: Diagnosis not present

## 2016-08-26 DIAGNOSIS — Z79899 Other long term (current) drug therapy: Secondary | ICD-10-CM | POA: Diagnosis not present

## 2016-08-26 LAB — BASIC METABOLIC PANEL
Anion gap: 7 (ref 5–15)
BUN: 18 mg/dL (ref 6–20)
CALCIUM: 9.1 mg/dL (ref 8.9–10.3)
CO2: 26 mmol/L (ref 22–32)
CREATININE: 0.75 mg/dL (ref 0.44–1.00)
Chloride: 105 mmol/L (ref 101–111)
GFR calc non Af Amer: >60 mL/min (ref >60)
Glucose, Bld: 92 mg/dL (ref 65–99)
Potassium: 3.7 mmol/L (ref 3.5–5.1)
SODIUM: 138 mmol/L (ref 135–145)

## 2016-08-26 LAB — CBC
HCT: 43.6 % (ref 36.0–46.0)
Hemoglobin: 13.9 g/dL (ref 12.0–15.0)
MCH: 30.2 pg (ref 26.0–34.0)
MCHC: 31.9 g/dL (ref 30.0–36.0)
MCV: 94.6 fL (ref 78.0–100.0)
PLATELETS: 253 10*3/uL (ref 150–400)
RBC: 4.61 MIL/uL (ref 3.87–5.11)
RDW: 14.7 % (ref 11.5–15.5)
WBC: 8.8 10*3/uL (ref 4.0–10.5)

## 2016-08-26 LAB — ECHOCARDIOGRAM COMPLETE
HEIGHTINCHES: 66 in
Weight: 2406.4 oz

## 2016-08-26 LAB — LIPID PANEL
CHOL/HDL RATIO: 2.5 ratio
CHOLESTEROL: 171 mg/dL (ref 0–200)
HDL: 68 mg/dL (ref >40)
LDL Cholesterol: 88 mg/dL (ref 0–99)
Triglycerides: 77 mg/dL (ref <150)
VLDL: 15 mg/dL (ref 0–40)

## 2016-08-26 LAB — HEMOGLOBIN A1C
Hgb A1c MFr Bld: 5.6 % (ref 4.8–5.6)
Mean Plasma Glucose: 114 mg/dL

## 2016-08-26 LAB — TROPONIN I: Troponin I: <0.03 ng/mL (ref <0.03)

## 2016-08-26 MED ORDER — DILTIAZEM HCL ER COATED BEADS 180 MG PO CP24
180.0000 mg | ORAL_CAPSULE | Freq: Every day | ORAL | Status: DC
  Administered 2016-08-26: 180 mg via ORAL
  Filled 2016-08-26: qty 1

## 2016-08-26 MED ORDER — APIXABAN 5 MG PO TABS: 5.0000 mg | ORAL_TABLET | Freq: Two times a day (BID) | ORAL | 0 refills | Status: DC

## 2016-08-26 MED ORDER — DILTIAZEM HCL ER COATED BEADS 180 MG PO CP24: 180.0000 mg | ORAL_CAPSULE | Freq: Every day | ORAL | 0 refills | Status: DC

## 2016-08-26 NOTE — Care Management Note (Signed)
Case Management Note Marvetta Gibbons RN, BSN Unit 2W-Case Manager [phone]  Patient Details  Name: Chloe Lopez MRN: [id number] Date of Birth: Mar 24, 1924  Subjective/Objective:   Pt presented with afib                 Action/Plan: PTA pt lived at home- started on Eliquis- pt given 30 day free card to use on discharge- was unable to do benefits check prior to discharge- pt to f/u with pharmacy regarding cost and if copay too high will f/u with her doctor for alternate drug choice.   Expected Discharge Date:  08/26/16               Expected Discharge Plan:  Home/Self Care  In-House Referral:     Discharge planning Services  CM Consult, Medication Assistance  Post Acute Care Choice:    Choice offered to:     DME Arranged:    DME Agency:     HH Arranged:    HH Agency:     Status of Service:  Completed, signed off  If discussed at [employer] of Stay Meetings, dates discussed:    Additional Comments:  Dawayne Patricia, RN 08/26/2016, 2:11 PM

## 2016-08-26 NOTE — Progress Notes (Signed)
Family Medicine Teaching Service Daily Progress Note Intern Pager: [phone]  Patient name: Chloe Lopez Medical record number: [id number] Date of birth: [birth year] Age: 83 y.o. Gender: female  Primary Care Provider: Chrisandra Netters, MD Consultants: none Code Status: DNR/DNI (per discussion on admission)  Pt Overview and Major Events to Date:  4/24 Placed in observation for asymptomatic afib  Assessment and Plan: Chloe Lopez is a 83 y.o. female presenting with fast heart rate. PMH is significant for h/o breast CA s/p lumpectomy, recurrent urticaria and angioedema, osteopenia, sciatica, age related cognitive decline.  New onset A fib: Presented in Afib RVR requiring diltiazem gtt but now rate controlled on po diltiazem with HR in 80s. Troponins neg. CHADsVASC 3 (age, sex). HAS-BLED 1.  - po diltiazem 30mg  q6h, will change to 24h tablet 180mg  on DC. - Eliquis 5mg  BID - monitor on telemetry - Risk stratification labs: TSH 1.387, a1c 5.6, LDL 88 - Echo pending - Consult cardiology if patient goes back into A fib with RVR or if hemodynamically unstable  Chronic steroid use: On steroids for recurrent urticaria and angioedema. Follows with allergist.  - continue home prednisone daily, alternating 10mg  and 5mg  every other day  Osteopenia: Likely to chronic steroid use - consider start of empiric calcium and vit D supplementation  Recurrent urticaria and angioedema: chronic prednisone as above. Additionally on doxepin and ranitidine per allergist - Continue home medications  Sciatica Pain: Notes that she has some tingling pain that radiates to her left ankle at night. Take Tramadol intermittently for this.  - Continue home Tramadol PRN  Hx of breast cancer: s/p partial mastectomy on left breast. Patient notes she has had follow up mammograms that were normal. No chemo or radiation  Hx of Age related cognitive decline: MoCA score of 24 in Feb 2018. Patient is oriented  x3.  FEN/GI: regular diet, ranitidine Prophylaxis: eliquis  Disposition: home pending echo  Subjective:  States feels well this morning. No CP, SOB, abdominal pain.   Objective: Temp:  [97.5 F (36.4 C)-98.4 F (36.9 C)] 97.6 F (36.4 C) (04/25 0642) Pulse Rate:  [44-140] 87 (04/25 0642) Resp:  [16-25] 18 (04/25 0642) BP: (104-160)/(72-110) 128/81 (04/25 0642) SpO2:  [94 %-98 %] 97 % (04/25 0642) Weight:  [150 lb 6.4 oz (68.2 kg)-153 lb (69.4 kg)] 150 lb 6.4 oz (68.2 kg) (04/24 1608) Physical Exam: General: Lying in bed comfortably, in NAD Cardiovascular: irregularly irregular, normal rate, no murmurs Respiratory: CTAB, normal effort on room air Abdomen: soft, nontender, nondistended, + bowel sounds Extremities: no LE edema, warm and well perfused  Laboratory:  Recent Labs Lab 08/25/16 1022 08/26/16 0256  WBC 8.9 8.8  HGB 15.9* 13.9  HCT 47.9* 43.6  PLT 283 253    Recent Labs Lab 08/25/16 1022 08/26/16 0256  NA 139 138  K 4.3 3.7  CL 108 105  CO2 22 26  BUN 14 18  CREATININE 0.75 0.75  CALCIUM 9.6 9.1  GLUCOSE 137* 92   TSH 1.387 Hemoglobin a1c 5.6 Troponin <0.03 x3  Lipid Panel     Component Value Date/Time   CHOL 171 08/26/2016 0256   TRIG 77 08/26/2016 0256   HDL 68 08/26/2016 0256   CHOLHDL 2.5 08/26/2016 0256   VLDL 15 08/26/2016 0256   LDLCALC 88 08/26/2016 0256    Imaging/Diagnostic Tests: Dg Chest Port 1 View  Result Date: 08/25/2016 CLINICAL DATA:  Atrial fibrillation EXAM: PORTABLE CHEST 1 VIEW COMPARISON:  06/14/2009 FINDINGS: Mild cardiomegaly is stable.  Chronic aortic tortuosity. Chronic interstitial coarsening, likely bronchitic. No Kerley line or air bronchogram. No effusion or pneumothorax. 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 IMPRESSION: No acute finding when compared to priors. Electronically Signed   By: Monte Fantasia M.D.   On: 08/25/2016 10:57    [name],  DO 08/26/2016, 7:03 AM PGY-1, [place] Intern pager: [phone], text pages welcome

## 2016-08-26 NOTE — Discharge Summary (Signed)
[facility] Discharge Summary  Patient name: Chloe Lopez Medical record number: [id number] Date of birth: Sep 15, 1923 Age: 83 y.o. Gender: female Date of Admission: 08/25/2016  Date of Discharge: 08/26/16 Admitting Physician: Lind Covert, MD  Primary Care Provider: Chrisandra Netters, MD Consultants: none  Indication for Hospitalization: Afib with RVR  Discharge Diagnoses/Problem List:  New onset Afib Chronic steroid use Osteopenia Recurrent urticaria and angioedema Sciatica pain h/o breast CA   Disposition: Home  Discharge Condition: Stable, improved  Discharge Exam:  General: Lying in bed comfortably, in NAD Cardiovascular: irregularly irregular, normal rate, no murmurs Respiratory: CTAB, normal effort on room air Abdomen: soft, nontender, nondistended, + bowel sounds Extremities: no LE edema, warm and well perfused  Brief Hospital Course:  Chloe Lopez a 83 y.o.femalewith PMH significant for h/o breast CA s/p lumpectomy, recurrent urticaria and angioedema, osteopenia, sciatica, age related cognitive decline who presented in asymptomatic Afib with RVR. She was started on a diltiazem gtt and did well, was able to be transitioned to po diltizem and maintained good rate control. Given her CHADsVASC 3 and HAS-BLED 1, patient was asked regarding her wishes for anticoagulation. Patient opted for anticoagulation and was started on Eliquis which she tolerated well.   Issues for Follow Up:  1. Please follow up on rate control since started on po cardiazem 24h tablet 180mg  on DC.  Significant Procedures: none  Significant Labs and Imaging:   Recent Labs Lab 08/25/16 1022 08/26/16 0256  WBC 8.9 8.8  HGB 15.9* 13.9  HCT 47.9* 43.6  PLT 283 253    Recent Labs Lab 08/25/16 1022 08/26/16 0256  NA 139 138  K 4.3 3.7  CL 108 105  CO2 22 26  GLUCOSE 137* 92  BUN 14 18  CREATININE 0.75 0.75  CALCIUM 9.6 9.1     Results/Tests  Pending at Time of Discharge: none  Discharge Medications:  Allergies as of 08/26/2016      Reactions   Azithromycin Swelling   Reportedly had reaction September 2017 - caused worsening angioedema. Unclear if it was this or the benzonatate she started at the same time.   Benzonatate Swelling   Reportedly had reaction September 2017 - caused worsening angioedema. Unclear if it was this or the azithromycin she started at the same time.      Medication List    TAKE these medications   apixaban 5 MG Tabs tablet Commonly known as:  ELIQUIS Take 1 tablet (5 mg total) by mouth 2 (two) times daily.   diltiazem 180 MG 24 hr capsule Commonly known as:  CARDIZEM CD Take 1 capsule (180 mg total) by mouth daily.   doxepin 10 MG capsule Commonly known as:  SINEQUAN Take 3 capsules (30 mg total) by mouth at bedtime.   EPIPEN 2-PAK 0.3 mg/0.3 mL Soaj injection Generic drug:  EPINEPHrine Inject 0.3 mg into the muscle once. Reported on 08/30/2015   ONE-A-DAY 50 PLUS PO Take 1 tablet by mouth daily.   predniSONE 5 MG tablet Commonly known as:  DELTASONE Take as directed. Alternate every other day with 10mg . What changed:  how much to take  how to take this  when to take this  additional instructions   predniSONE 10 MG tablet Commonly known as:  DELTASONE Take as directed. Alternate 10mg  one day then 5mg  the next. What changed:  how much to take  how to take this  when to take this  additional instructions   ranitidine 150  MG tablet Commonly known as:  ZANTAC Take one tablet twice a day. What changed:  how much to take  how to take this  when to take this  additional instructions   traMADol 50 MG tablet Commonly known as:  ULTRAM Take 1 tablet (50 mg total) by mouth daily as needed (pain in legs).       Discharge Instructions: Please refer to Patient Instructions section of EMR for full details.  Patient was counseled important signs and symptoms that should  prompt return to medical care, changes in medications, dietary instructions, activity restrictions, and follow up appointments.   Follow-Up Appointments:   Follow-up Information    [name], DO. Go on 09/01/2016.   Why:  Hospital follow up appointment at 1:30pm, please arrive 12min early for check in Contact information: [place] [id number] [place], DO 08/26/2016, 11:14 AM PGY-1, [place]

## 2016-08-26 NOTE — Discharge Instructions (Signed)
It has been a pleasure taking care of you! You were admitted due to Atrial fibrillation. We have treated you with a medication called diltiazem that controls your heart rate. With that your symptoms improved to the point we think it is safe to let you go home and follow up in the office.   -Please take Diltiazem 180mg  once daily. You may want to get a device to measure your heart rate at home for the next week and bring a record to your hospital follow up appointment in the clinic. -You were started on a blood thinner called Eliquis 5mg  twice a day. Risks of taking a blood thinner include bleeding or easy bruising.   There could be some changes made to your home medications during this hospitalization. Please, make sure to read the directions before you take them. The names and directions on how to take these medications are found on this discharge paper under medication section.  Please follow up at your primary care doctor's office. The address, date and time are found on the discharge paper under follow up section.   Apixaban oral tablets What is this medicine? APIXABAN (a PIX a ban) is an anticoagulant (blood thinner). It is used to lower the chance of stroke in people with a medical condition called atrial fibrillation. It is also used to treat or prevent blood clots in the lungs or in the veins. This medicine may be used for other purposes; ask your health care provider or pharmacist if you have questions. COMMON BRAND NAME(S): Eliquis What should I tell my health care provider before I take this medicine? They need to know if you have any of these conditions: -bleeding disorders -bleeding in the brain -blood in your stools (black or tarry stools) or if you have blood in your vomit -history of stomach bleeding -kidney disease -liver disease -mechanical heart valve -an unusual or allergic reaction to apixaban, other medicines, foods, dyes, or preservatives -pregnant or trying to get  pregnant -breast-feeding How should I use this medicine? Take this medicine by mouth with a glass of water. Follow the directions on the prescription label. You can take it with or without food. If it upsets your stomach, take it with food. Take your medicine at regular intervals. Do not take it more often than directed. Do not stop taking except on your doctor's advice. Stopping this medicine may increase your risk of a blot clot. Be sure to refill your prescription before you run out of medicine. Talk to your pediatrician regarding the use of this medicine in children. Special care may be needed. Overdosage: If you think you have taken too much of this medicine contact a poison control center or emergency room at once. NOTE: This medicine is only for you. Do not share this medicine with others. What if I miss a dose? If you miss a dose, take it as soon as you can. If it is almost time for your next dose, take only that dose. Do not take double or extra doses. What may interact with this medicine? This medicine may interact with the following: -aspirin and aspirin-like medicines -certain medicines for fungal infections like ketoconazole and itraconazole -certain medicines for seizures like carbamazepine and phenytoin -certain medicines that treat or prevent blood clots like warfarin, enoxaparin, and dalteparin -clarithromycin -NSAIDs, medicines for pain and inflammation, like ibuprofen or naproxen -rifampin -ritonavir -St. John's wort This list may not describe all possible interactions. Give your health care provider a list of all the  medicines, herbs, non-prescription drugs, or dietary supplements you use. Also tell them if you smoke, drink alcohol, or use illegal drugs. Some items may interact with your medicine. What should I watch for while using this medicine? Visit your doctor or health care professional for regular checks on your progress. Notify your doctor or health care  professional and seek emergency treatment if you develop breathing problems; changes in vision; chest pain; severe, sudden headache; pain, swelling, warmth in the leg; trouble speaking; sudden numbness or weakness of the face, arm or leg. These can be signs that your condition has gotten worse. If you are going to have surgery or other procedure, tell your doctor that you are taking this medicine. What side effects may I notice from receiving this medicine? Side effects that you should report to your doctor or health care professional as soon as possible: -allergic reactions like skin rash, itching or hives, swelling of the face, lips, or tongue -signs and symptoms of bleeding such as bloody or black, tarry stools; red or dark-brown urine; spitting up blood or brown material that looks like coffee grounds; red spots on the skin; unusual bruising or bleeding from the eye, gums, or nose This list may not describe all possible side effects. Call your doctor for medical advice about side effects. You may report side effects to FDA at 1-800-FDA-1088. Where should I keep my medicine? Keep out of the reach of children. Store at room temperature between 20 and 25 degrees C (68 and 77 degrees F). Throw away any unused medicine after the expiration date. NOTE: This sheet is a summary. It may not cover all possible information. If you have questions about this medicine, talk to your doctor, pharmacist, or health care provider.  2018 Elsevier/Gold Standard (2015-11-11 11:54:23)

## 2016-08-26 NOTE — Progress Notes (Signed)
Echocardiogram 2D Echocardiogram has been performed.  Chloe Lopez 08/26/2016, 11:10 AM

## 2016-09-01 ENCOUNTER — Encounter: Payer: Self-pay | Admitting: Family Medicine

## 2016-09-01 ENCOUNTER — Ambulatory Visit (INDEPENDENT_AMBULATORY_CARE_PROVIDER_SITE_OTHER): Payer: Medicare Other | Admitting: Family Medicine

## 2016-09-01 VITALS — BP 118/76 | HR 52 | Temp 97.7°F | Ht 66.0 in | Wt 152.4 lb

## 2016-09-01 DIAGNOSIS — I4891 Unspecified atrial fibrillation: Secondary | ICD-10-CM

## 2016-09-01 DIAGNOSIS — G6289 Other specified polyneuropathies: Secondary | ICD-10-CM

## 2016-09-01 MED ORDER — TRAMADOL HCL 50 MG PO TABS: 50.0000 mg | ORAL_TABLET | Freq: Every day | ORAL | 0 refills | Status: DC | PRN

## 2016-09-01 NOTE — Assessment & Plan Note (Signed)
Rate controlled on diltiazem, doing well and asymptomatic. CHADVASc 3 and HASBLED 1. Discussed Eliquis vs ASA vs discontinuing all anticoagulation. Patient opted for continued anticoagulation at this time.  - Continue diltiazem 24h tablet 180mg  daily. Offered to switch to short acting but patient preferred to explore her insurance options further and discuss with PCP. - Continue Eliquis, patient provided with copay card today.

## 2016-09-01 NOTE — Patient Instructions (Signed)
It was great to see you again!  For your Afib, - Please continue taking the diltiazem 24hour 180mg  tablet once daily. If cost becomes a problem then there is a short acting version that may be more affordable although it is several times a day.  I have refilled your tramadol to get you to your next appointment with Dr. Ardelia Mems   Take care and seek immediate care sooner if you develop any concerns.   Dr. [name], [place]

## 2016-09-01 NOTE — Assessment & Plan Note (Signed)
Per PCP note, had planned to refill tramadol at last visit but patient was unexpectedly admitted to the hospital.  - Will provide one refill to allow patient to make appointment with PCP in 1 month.

## 2016-09-01 NOTE — Progress Notes (Signed)
    Subjective:  Chloe Lopez is a 83 y.o. female who presents to the [facility] today for hospital follow up.  HPI:   Date of Admission: 08/25/16    Date of Discharge: 08/26/16  Primary Care Physician (PCP): Dr. Chrisandra Lopez  Discharge Diagnosis: Atrial fibrillation    TODAY's VISIT  New medications prescribed/discontinued upon discharge?  - Has been doing well since discharge, has no complaints.Taking both diltiazem 24hr tablet and eliquis and tolerating both well. - Did pick up a BP/HR monitor as instructed however, the instructions stated not to use in patients with Afib so she has not been monitoring HR at home. - Asking for refill of tramadol since she was due at last visit but unexpectedly was admitted to the hospital.  Barriers identified related to medications:  - She does not have medicare prescription coverage and has had to pay ~$500 for her medications. She is currently able to afford them but not for the long term. Is working with her family members and is pursuing other options. Would prefer to stay on her current regimen and readdress with her PCP when she has a better idea of her financial situation and insurance coverage.   ROS: Per HPI. Otherwise, no CP, palpitations, lightheaded, dizziness, SOB, falls or bruising.  Objective:  Physical Exam: BP 118/76   Pulse (!) 52   Temp 97.7 F (36.5 C) (Oral)   Ht 5\' 6"  (1.676 m)   Wt 152 lb 6.4 oz (69.1 kg)   SpO2 95%   BMI 24.60 kg/m  HR manual recheck 70 bpm Gen: NAD, resting comfortably CV: irregularly irregular, no murmurs appreciated Pulm: NWOB, CTAB with no crackles, wheezes, or rhonchi GI: Normal bowel sounds present. Soft, Nontender, Nondistended. MSK: no edema, cyanosis, or clubbing noted Skin: warm, dry Neuro: grossly normal, moves all extremities Psych: Normal affect and thought content   Assessment/Plan:  Atrial fibrillation (HCC) Rate controlled on diltiazem, doing well and asymptomatic. CHADVASc 3  and HASBLED 1. Discussed Eliquis vs ASA vs discontinuing all anticoagulation. Patient opted for continued anticoagulation at this time.  - Continue diltiazem 24h tablet 180mg  daily. Offered to switch to short acting but patient preferred to explore her insurance options further and discuss with PCP. - Continue Eliquis, patient provided with copay card today.  Peripheral neuropathy ([place]) Per PCP note, had planned to refill tramadol at last visit but patient was unexpectedly admitted to the hospital.  - Will provide one refill to allow patient to make appointment with PCP in 1 month.   [name], DO PGY-1, [facility] 09/01/2016 2:21 PM

## 2016-09-14 NOTE — Progress Notes (Signed)
    Subjective:  Chloe Lopez is a 83 y.o. female who presents to the [facility] today for follow up on Atrial fibrillation.  HPI:  States has been doing well. Recently gave up caffeine, was previously drinking 2 pots of coffee daily and now is drinking decaf. Has noticed she is occasionally very tired but attributes this to normal aging vs not having as much caffeine. Taking diltiazem regularly, tolerating well. No CP, SOB, lightheadedness/dizziness, falls or bruising. Having difficulty affording Eliquis, has already used copay card. Is asking if she can come of anticoagulation completely.    ROS: Per HPI  Objective:  Physical Exam: BP 116/60   Pulse 63   Temp 98.3 F (36.8 C) (Oral)   Ht 5\' 6"  (1.676 m)   Wt 150 lb (68 kg)   SpO2 97%   BMI 24.21 kg/m   Gen: NAD, resting comfortably CV: irregularly irregular, no murmurs appreciated Pulm: NWOB, CTAB with no crackles, wheezes, or rhonchi MSK: no edema, cyanosis, or clubbing noted Skin: warm, dry Neuro: grossly normal, moves all extremities Psych: Normal affect and thought content   Assessment/Plan:  Atrial fibrillation (HCC) Rate controlled on diltiazem and continuing to do well. Eliquis has become very costly for patient so revisited discussion of DOAC vs warfarin vs ASA vs discontinuing all anticoagulation. Patient and daughter are in agreement about discontinuing all anticoagulation per patient's wishes knowing that risk of stroke is 3.2% per year by CHADSVASc 49 (age, gender).  - Continue diltiazem 24h tablet 180mg  daily - Patient will finish out the Eliquis she has before coming off all anticoagulation.   [name], DO PGY-1, [facility] 09/15/2016 8:39 AM

## 2016-09-15 ENCOUNTER — Ambulatory Visit (INDEPENDENT_AMBULATORY_CARE_PROVIDER_SITE_OTHER): Payer: Medicare Other | Admitting: Family Medicine

## 2016-09-15 ENCOUNTER — Encounter: Payer: Self-pay | Admitting: Family Medicine

## 2016-09-15 VITALS — BP 116/60 | HR 63 | Temp 98.3°F | Ht 66.0 in | Wt 150.0 lb

## 2016-09-15 DIAGNOSIS — I4891 Unspecified atrial fibrillation: Secondary | ICD-10-CM

## 2016-09-15 NOTE — Patient Instructions (Signed)
It was great to see you again!  For your atrial fibrillation, - Please keep taking the diltiazem for heart rate control. - We talked about anticoagulation today with the pros vs cons of continuing Eliquis vs warfarin vs aspirin. We decided on no anticoagulation so you can finish the Eliquis you have before coming off all anticoagulation.  Please make your next appointment with Dr. Ardelia Mems in June  Take care and seek immediate care sooner if you develop any concerns.   Dr. [name], [place]

## 2016-09-15 NOTE — Assessment & Plan Note (Signed)
Rate controlled on diltiazem and continuing to do well. Eliquis has become very costly for patient so revisited discussion of DOAC vs warfarin vs ASA vs discontinuing all anticoagulation. Patient and daughter are in agreement about discontinuing all anticoagulation per patient's wishes knowing that risk of stroke is 3.2% per year by CHADSVASc 50 (age, gender).  - Continue diltiazem 24h tablet 180mg  daily - Patient will finish out the Eliquis she has before coming off all anticoagulation.

## 2016-10-01 ENCOUNTER — Other Ambulatory Visit: Payer: Self-pay | Admitting: Family Medicine

## 2016-10-01 MED ORDER — DILTIAZEM HCL ER COATED BEADS 180 MG PO CP24: 180.0000 mg | ORAL_CAPSULE | Freq: Every day | ORAL | 5 refills | Status: DC

## 2016-10-01 NOTE — Telephone Encounter (Signed)
Pt needs a refill on Cardizem. Pt uses [facility]. ep

## 2016-10-05 ENCOUNTER — Ambulatory Visit (INDEPENDENT_AMBULATORY_CARE_PROVIDER_SITE_OTHER): Payer: Medicare Other | Admitting: Family Medicine

## 2016-10-05 ENCOUNTER — Encounter: Payer: Self-pay | Admitting: Family Medicine

## 2016-10-05 VITALS — BP 112/78 | HR 92 | Temp 98.2°F | Ht 66.0 in | Wt 153.6 lb

## 2016-10-05 DIAGNOSIS — L508 Other urticaria: Secondary | ICD-10-CM

## 2016-10-05 DIAGNOSIS — G6289 Other specified polyneuropathies: Secondary | ICD-10-CM | POA: Diagnosis not present

## 2016-10-05 DIAGNOSIS — I4891 Unspecified atrial fibrillation: Secondary | ICD-10-CM | POA: Diagnosis not present

## 2016-10-05 MED ORDER — TRAMADOL HCL 50 MG PO TABS: 50.0000 mg | ORAL_TABLET | Freq: Every day | ORAL | 3 refills | Status: DC | PRN

## 2016-10-05 NOTE — Progress Notes (Signed)
Date of Visit: 10/05/2016   HPI:  Patient presents for routine follow up.  Leg pains - requests refill of tramadol. Takes it nightly as needed. No misuse. Helps her sleep well. Would like to space how often she sees me to q4 month instead of q3 months just to save money on copays, so she is requesting a four month supply prescribed today. Has mild leg swelling, no shortness of breath. Overall similar to prior.  Atrial fibrillation f/u - tolerating diltiazem well. No chest pain, shortness of breath, palpitations (though she was asymptomatic from the beginning). Remains off anticoagulation due to cost and not wanting to be on blood thinners. Accepts the risks of a stroke with her afib.   Doxepin/prednisone - she asks whether I would be willing to prescribe these medications for her, which she takes for chronic urticaria/angioedema.  She is followed by Dr. Carmelina Peal for this. She is looking to simplify how often she has to go to the doctor, and would like to have all medications prescribed in one place.  Seborrheic keratoses - she has follow up in place with her dermatologist for these. Does not think they erupted suddenly. discsused today the concern for underlying malignancy if she had many of these come up at once.  ROS: See HPI.  [place]: history of breast cancer, hives/agnioedema, osteopenia, venous insufficiency  PHYSICAL EXAM: BP 112/78   Pulse 92   Temp 98.2 F (36.8 C) (Oral)   Ht 5\' 6"  (1.676 m)   Wt 153 lb 9.6 oz (69.7 kg)   SpO2 95%   BMI 24.79 kg/m  Gen: no acute distress, pleasant, cooperative HEENT: normocephalic, atraumatic, moist mucous membranes  Heart: irregularly irregular rhythm, normal rate Lungs: clear to auscultation bilaterally, normal work of breathing  Neuro: alert, grossly nonfocal, speech normal Ext: minimal appreciable lower extremity edema bilaterally   ASSESSMENT/PLAN:  Urticaria: followed by Dr. Carmelina Peal, prescribed prednisone & doxepin - advised patient I  don't typically prescribe doxepin. I would potentially be willing to take over this if Dr. Carmelina Peal provides clear guidance on what is necessary for managing this condition. She will consider this and speak with him.  Seborrheic keratoses - discussed briefly today as a follow up from our last visit. She has follow up with dermatology scheduled. She is aware that sudden eruption could raise concern for underlying malignancy, though this seems less likely as she now states they were more slowly showing up.  Atrial fibrillation - doing well on diltiazem. Has opted out of anticoagulation, which is reasonable as she accepts the risks of stroke. Presently rate controlled. Continue current regimen.  Leg pain - well controlled on daily as needed tramadol. Refilled today. Follow up with me in 4 months, sooner if needed.  FOLLOW UP: Follow up in 4 months for above issues  [place] J. Ardelia Mems, [place]

## 2016-10-05 NOTE — Patient Instructions (Signed)
Great to see you today I'm glad you're doing so well  Refilled tramadol  See me in 4 months  Be well, Dr. Ardelia Mems

## 2016-11-30 ENCOUNTER — Other Ambulatory Visit: Payer: Self-pay | Admitting: Allergy and Immunology

## 2016-11-30 DIAGNOSIS — L821 Other seborrheic keratosis: Secondary | ICD-10-CM | POA: Diagnosis not present

## 2017-01-27 ENCOUNTER — Other Ambulatory Visit: Payer: Self-pay | Admitting: Family Medicine

## 2017-01-27 DIAGNOSIS — G6289 Other specified polyneuropathies: Secondary | ICD-10-CM

## 2017-01-27 NOTE — Telephone Encounter (Signed)
Pt tried to schedule an appt for Chloe Lopez in Oct for her 4 month checkup but nothing was available.  Should she an appt with another dr?  She is doing ok- no problems. She will run out of her tramadol at end of sept and needs a refill.  She uses [facility]. Please let her know if this can be called in.

## 2017-02-03 NOTE — Telephone Encounter (Signed)
I am covering for Dr. Ardelia Mems who is out of town.  Please have her schedule with another physician.

## 2017-02-04 NOTE — Telephone Encounter (Signed)
Patient informed of message from Dr. Mingo Amber. Patient states she would prefer to wait until Dr. Ardelia Mems returns to see if she will fill medication until she can see her for an appointment. Patient informed that Dr. Ardelia Mems should be back next week and she could expect to hear something then.

## 2017-02-08 MED ORDER — TRAMADOL HCL 50 MG PO TABS: 50.0000 mg | ORAL_TABLET | Freq: Every day | ORAL | 0 refills | Status: DC | PRN

## 2017-02-08 NOTE — Telephone Encounter (Signed)
l called in a month's supply for patient. Please let her know. Thanks Leeanne Rio, MD

## 2017-02-09 NOTE — Telephone Encounter (Signed)
Patient informed, appointment scheduled with PCP for 11/2.

## 2017-03-05 ENCOUNTER — Ambulatory Visit: Payer: Medicare Other | Admitting: Family Medicine

## 2017-03-19 ENCOUNTER — Encounter: Payer: Self-pay | Admitting: Family Medicine

## 2017-03-19 ENCOUNTER — Other Ambulatory Visit: Payer: Self-pay

## 2017-03-19 ENCOUNTER — Ambulatory Visit (INDEPENDENT_AMBULATORY_CARE_PROVIDER_SITE_OTHER): Payer: Medicare Other | Admitting: Family Medicine

## 2017-03-19 VITALS — BP 132/82 | HR 75 | Temp 97.8°F | Ht 66.0 in | Wt 150.6 lb

## 2017-03-19 DIAGNOSIS — Z23 Encounter for immunization: Secondary | ICD-10-CM | POA: Diagnosis present

## 2017-03-19 DIAGNOSIS — M8580 Other specified disorders of bone density and structure, unspecified site: Secondary | ICD-10-CM | POA: Diagnosis not present

## 2017-03-19 DIAGNOSIS — G6289 Other specified polyneuropathies: Secondary | ICD-10-CM | POA: Diagnosis not present

## 2017-03-19 DIAGNOSIS — I4891 Unspecified atrial fibrillation: Secondary | ICD-10-CM | POA: Diagnosis not present

## 2017-03-19 MED ORDER — TRAMADOL HCL 50 MG PO TABS: 50.0000 mg | ORAL_TABLET | Freq: Every day | ORAL | 3 refills | Status: DC | PRN

## 2017-03-19 NOTE — Assessment & Plan Note (Signed)
Discussed options for repeating DEXA scan and possibly starting a bisphosphonate with patient, but she prefers to not chase down any osteoporosis or osteopenia at this time, given her advanced age.

## 2017-03-19 NOTE — Assessment & Plan Note (Signed)
Well-controlled with as needed tramadol.  Refill provided today.  I did offer physical therapy evaluation for patient given her difficulty rising from a seated position with knee pain, but she declined this at this time.  Follow-up in 4 months.

## 2017-03-19 NOTE — Progress Notes (Signed)
Date of Visit: 03/19/2017   HPI:  Shyonna presents for follow-up.  Leg pain: Doing very well with taking tramadol once per day.  Takes it just before bedtime, and it allows her to sleep.  Having some increased pain in both of her knees, especially on the left side.  Able to walk fine on her legs, but has to use her hands to rise from a seated position.  Atrial fibrillation: Currently taking diltiazem.  Tolerating this well.  She has previously opted against anticoagulation.  Osteopenia: Discussed bone density with patient in light of her chronic steroid use.  She last had a DEXA scan about 6 years ago, which showed osteopenia but had a high fracture risk.  Discussed the option of repeating the DEXA scan and considering a bisphosphonate therapy, but patient prefers at this time to not undergo any workup or treatment for osteoporosis. Prefers to only treat conditions which currently make her feel bad, in light of her age.  Health maintenance: Agreeable to flu shot and Prevnar today.  Also agreeable to scheduling annual wellness visit.  ROS: See HPI.  [place]: History of atrial fibrillation, peripheral neuropathy, chronic urticaria, seborrheic keratoses, osteopenia, venous insufficiency  PHYSICAL EXAM: BP 132/82   Pulse 75   Temp 97.8 F (36.6 C) (Oral)   Ht 5\' 6"  (1.676 m)   Wt 150 lb 9.6 oz (68.3 kg)   SpO2 98%   BMI 24.31 kg/m  Gen: no acute distress, pleasant, cooperative, well appearing HEENT: normocephalic, atraumatic Heart: irregularly irreg rhythm, no murmur, palpated pulse rate of 78 Lungs: clear to auscultation bilaterally, normal work of breathing  Neuro: alert, grossly nonfocal, speech normal Ext: minimal edema bilateral lower extremities. Crepitus present with flexion & extension of bilateral knees. No joint effusion or redness.  ASSESSMENT/PLAN:  Health maintenance:  -given flu & prevnar today -Advised to schedule annual wellness visit  Atrial fibrillation ([place]) Rate  controlled, not on anticoagulation per patient's choice.  Continue diltiazem.  Peripheral neuropathy ([place]) Well-controlled with as needed tramadol.  Refill provided today.  I did offer physical therapy evaluation for patient given her difficulty rising from a seated position with knee pain, but she declined this at this time.  Follow-up in 4 months.  Osteopenia Discussed options for repeating DEXA scan and possibly starting a bisphosphonate with patient, but she prefers to not chase down any osteoporosis or osteopenia at this time, given her advanced age.  FOLLOW UP: Follow up in 4 mos for routine medical issues  [place] J. Ardelia Mems, [place]

## 2017-03-19 NOTE — Assessment & Plan Note (Signed)
Rate controlled, not on anticoagulation per patient's choice.  Continue diltiazem.

## 2017-03-19 NOTE — Patient Instructions (Signed)
It was great to see you again today!  Refilled tramadol for 4 months  Schedule an annual wellness visit with Adonis Brook or Ander Purpura. This is a longer visit to focus on your goals and how to keep you well. It is free.   Be well, Dr. Ardelia Mems

## 2017-03-30 ENCOUNTER — Other Ambulatory Visit: Payer: Self-pay | Admitting: Family Medicine

## 2017-04-30 ENCOUNTER — Other Ambulatory Visit: Payer: Self-pay | Admitting: Allergy and Immunology

## 2017-04-30 ENCOUNTER — Other Ambulatory Visit: Payer: Self-pay

## 2017-04-30 MED ORDER — PREDNISONE 10 MG PO TABS: ORAL_TABLET | ORAL | 1 refills | Status: AC

## 2017-04-30 MED ORDER — PREDNISONE 5 MG PO TABS: ORAL_TABLET | ORAL | 1 refills | Status: DC

## 2017-04-30 NOTE — Telephone Encounter (Signed)
Patient aware that medication was sent into the pharmacy.

## 2017-04-30 NOTE — Telephone Encounter (Signed)
Patient was told by her pharmacy to give [place] a call for her refills on her 5mg  and 10mg  Prednisone.   Please Advise  Chloe Lopez and [name]   Please call patient when sent in.

## 2017-05-25 ENCOUNTER — Encounter (HOSPITAL_COMMUNITY): Payer: Self-pay | Admitting: Emergency Medicine

## 2017-05-25 ENCOUNTER — Emergency Department (HOSPITAL_COMMUNITY): Payer: Medicare Other | Admitting: Certified Registered Nurse Anesthetist

## 2017-05-25 ENCOUNTER — Inpatient Hospital Stay (HOSPITAL_COMMUNITY)
Admission: EM | Admit: 2017-05-25 | Discharge: 2017-06-03 | DRG: 064 | Disposition: A | Discharge status: Skilled Nursing Facility | Payer: Medicare Other | Attending: Neurology | Admitting: Neurology

## 2017-05-25 ENCOUNTER — Inpatient Hospital Stay (HOSPITAL_COMMUNITY): Payer: Medicare Other

## 2017-05-25 ENCOUNTER — Emergency Department (HOSPITAL_COMMUNITY): Payer: Medicare Other

## 2017-05-25 ENCOUNTER — Encounter (HOSPITAL_COMMUNITY)
Admission: EM | Disposition: A | Discharge status: Skilled Nursing Facility | Payer: Self-pay | Source: Home / Self Care | Attending: Neurology

## 2017-05-25 DIAGNOSIS — R4701 Aphasia: Secondary | ICD-10-CM | POA: Diagnosis present

## 2017-05-25 DIAGNOSIS — I482 Chronic atrial fibrillation: Secondary | ICD-10-CM | POA: Diagnosis not present

## 2017-05-25 DIAGNOSIS — B9620 Unspecified Escherichia coli [E. coli] as the cause of diseases classified elsewhere: Secondary | ICD-10-CM | POA: Diagnosis present

## 2017-05-25 DIAGNOSIS — I700 Atherosclerosis of aorta: Secondary | ICD-10-CM | POA: Diagnosis present

## 2017-05-25 DIAGNOSIS — R0781 Pleurodynia: Secondary | ICD-10-CM

## 2017-05-25 DIAGNOSIS — I6601 Occlusion and stenosis of right middle cerebral artery: Secondary | ICD-10-CM | POA: Diagnosis present

## 2017-05-25 DIAGNOSIS — G464 Cerebellar stroke syndrome: Secondary | ICD-10-CM | POA: Diagnosis not present

## 2017-05-25 DIAGNOSIS — F329 Major depressive disorder, single episode, unspecified: Secondary | ICD-10-CM | POA: Diagnosis present

## 2017-05-25 DIAGNOSIS — K8020 Calculus of gallbladder without cholecystitis without obstruction: Secondary | ICD-10-CM | POA: Diagnosis not present

## 2017-05-25 DIAGNOSIS — E785 Hyperlipidemia, unspecified: Secondary | ICD-10-CM | POA: Diagnosis present

## 2017-05-25 DIAGNOSIS — I63231 Cerebral infarction due to unspecified occlusion or stenosis of right carotid arteries: Secondary | ICD-10-CM | POA: Diagnosis not present

## 2017-05-25 DIAGNOSIS — I63411 Cerebral infarction due to embolism of right middle cerebral artery: Secondary | ICD-10-CM | POA: Diagnosis present

## 2017-05-25 DIAGNOSIS — R1312 Dysphagia, oropharyngeal phase: Secondary | ICD-10-CM | POA: Diagnosis not present

## 2017-05-25 DIAGNOSIS — R471 Dysarthria and anarthria: Secondary | ICD-10-CM | POA: Diagnosis present

## 2017-05-25 DIAGNOSIS — N390 Urinary tract infection, site not specified: Secondary | ICD-10-CM | POA: Diagnosis present

## 2017-05-25 DIAGNOSIS — I9589 Other hypotension: Secondary | ICD-10-CM | POA: Diagnosis not present

## 2017-05-25 DIAGNOSIS — B370 Candidal stomatitis: Secondary | ICD-10-CM | POA: Diagnosis not present

## 2017-05-25 DIAGNOSIS — I739 Peripheral vascular disease, unspecified: Secondary | ICD-10-CM | POA: Diagnosis present

## 2017-05-25 DIAGNOSIS — R079 Chest pain, unspecified: Secondary | ICD-10-CM | POA: Diagnosis not present

## 2017-05-25 DIAGNOSIS — R41841 Cognitive communication deficit: Secondary | ICD-10-CM | POA: Diagnosis not present

## 2017-05-25 DIAGNOSIS — T783XXA Angioneurotic edema, initial encounter: Secondary | ICD-10-CM | POA: Diagnosis present

## 2017-05-25 DIAGNOSIS — J690 Pneumonitis due to inhalation of food and vomit: Secondary | ICD-10-CM | POA: Diagnosis present

## 2017-05-25 DIAGNOSIS — I959 Hypotension, unspecified: Secondary | ICD-10-CM | POA: Diagnosis not present

## 2017-05-25 DIAGNOSIS — R29719 NIHSS score 19: Secondary | ICD-10-CM | POA: Diagnosis present

## 2017-05-25 DIAGNOSIS — R1011 Right upper quadrant pain: Secondary | ICD-10-CM | POA: Diagnosis not present

## 2017-05-25 DIAGNOSIS — R404 Transient alteration of awareness: Secondary | ICD-10-CM | POA: Diagnosis not present

## 2017-05-25 DIAGNOSIS — R1033 Periumbilical pain: Secondary | ICD-10-CM | POA: Diagnosis not present

## 2017-05-25 DIAGNOSIS — E876 Hypokalemia: Secondary | ICD-10-CM | POA: Diagnosis present

## 2017-05-25 DIAGNOSIS — Z87891 Personal history of nicotine dependence: Secondary | ICD-10-CM

## 2017-05-25 DIAGNOSIS — R1310 Dysphagia, unspecified: Secondary | ICD-10-CM | POA: Diagnosis present

## 2017-05-25 DIAGNOSIS — Z4682 Encounter for fitting and adjustment of non-vascular catheter: Secondary | ICD-10-CM | POA: Diagnosis not present

## 2017-05-25 DIAGNOSIS — Z7901 Long term (current) use of anticoagulants: Secondary | ICD-10-CM | POA: Diagnosis not present

## 2017-05-25 DIAGNOSIS — R531 Weakness: Secondary | ICD-10-CM | POA: Diagnosis not present

## 2017-05-25 DIAGNOSIS — R739 Hyperglycemia, unspecified: Secondary | ICD-10-CM | POA: Diagnosis present

## 2017-05-25 DIAGNOSIS — Z66 Do not resuscitate: Secondary | ICD-10-CM | POA: Diagnosis present

## 2017-05-25 DIAGNOSIS — K8010 Calculus of gallbladder with chronic cholecystitis without obstruction: Secondary | ICD-10-CM | POA: Diagnosis present

## 2017-05-25 DIAGNOSIS — I10 Essential (primary) hypertension: Secondary | ICD-10-CM | POA: Diagnosis present

## 2017-05-25 DIAGNOSIS — Z9010 Acquired absence of unspecified breast and nipple: Secondary | ICD-10-CM

## 2017-05-25 DIAGNOSIS — H518 Other specified disorders of binocular movement: Secondary | ICD-10-CM | POA: Diagnosis present

## 2017-05-25 DIAGNOSIS — R059 Cough, unspecified: Secondary | ICD-10-CM

## 2017-05-25 DIAGNOSIS — I679 Cerebrovascular disease, unspecified: Secondary | ICD-10-CM | POA: Diagnosis not present

## 2017-05-25 DIAGNOSIS — N3000 Acute cystitis without hematuria: Secondary | ICD-10-CM | POA: Diagnosis not present

## 2017-05-25 DIAGNOSIS — I639 Cerebral infarction, unspecified: Secondary | ICD-10-CM

## 2017-05-25 DIAGNOSIS — R29818 Other symptoms and signs involving the nervous system: Secondary | ICD-10-CM | POA: Diagnosis not present

## 2017-05-25 DIAGNOSIS — Q2546 Tortuous aortic arch: Secondary | ICD-10-CM | POA: Diagnosis not present

## 2017-05-25 DIAGNOSIS — R414 Neurologic neglect syndrome: Secondary | ICD-10-CM | POA: Diagnosis present

## 2017-05-25 DIAGNOSIS — R278 Other lack of coordination: Secondary | ICD-10-CM | POA: Diagnosis not present

## 2017-05-25 DIAGNOSIS — R29810 Facial weakness: Secondary | ICD-10-CM | POA: Diagnosis present

## 2017-05-25 DIAGNOSIS — G8190 Hemiplegia, unspecified affecting unspecified side: Secondary | ICD-10-CM | POA: Diagnosis not present

## 2017-05-25 DIAGNOSIS — J984 Other disorders of lung: Secondary | ICD-10-CM | POA: Diagnosis not present

## 2017-05-25 DIAGNOSIS — R0602 Shortness of breath: Secondary | ICD-10-CM | POA: Diagnosis not present

## 2017-05-25 DIAGNOSIS — I6521 Occlusion and stenosis of right carotid artery: Secondary | ICD-10-CM | POA: Diagnosis present

## 2017-05-25 DIAGNOSIS — I63311 Cerebral infarction due to thrombosis of right middle cerebral artery: Secondary | ICD-10-CM | POA: Diagnosis not present

## 2017-05-25 DIAGNOSIS — I69354 Hemiplegia and hemiparesis following cerebral infarction affecting left non-dominant side: Secondary | ICD-10-CM | POA: Diagnosis not present

## 2017-05-25 DIAGNOSIS — R05 Cough: Secondary | ICD-10-CM

## 2017-05-25 DIAGNOSIS — M6281 Muscle weakness (generalized): Secondary | ICD-10-CM | POA: Diagnosis not present

## 2017-05-25 DIAGNOSIS — R4781 Slurred speech: Secondary | ICD-10-CM | POA: Diagnosis not present

## 2017-05-25 DIAGNOSIS — G8194 Hemiplegia, unspecified affecting left nondominant side: Secondary | ICD-10-CM | POA: Diagnosis present

## 2017-05-25 DIAGNOSIS — I4891 Unspecified atrial fibrillation: Secondary | ICD-10-CM | POA: Diagnosis not present

## 2017-05-25 DIAGNOSIS — Z7952 Long term (current) use of systemic steroids: Secondary | ICD-10-CM

## 2017-05-25 DIAGNOSIS — J9601 Acute respiratory failure with hypoxia: Secondary | ICD-10-CM | POA: Diagnosis present

## 2017-05-25 DIAGNOSIS — Z90710 Acquired absence of both cervix and uterus: Secondary | ICD-10-CM

## 2017-05-25 DIAGNOSIS — R109 Unspecified abdominal pain: Secondary | ICD-10-CM

## 2017-05-25 DIAGNOSIS — Z978 Presence of other specified devices: Secondary | ICD-10-CM

## 2017-05-25 HISTORY — PX: IR PERCUTANEOUS ART THROMBECTOMY/INFUSION INTRACRANIAL INC DIAG ANGIO: IMG6087

## 2017-05-25 HISTORY — PX: IR ANGIO INTRA EXTRACRAN SEL COM CAROTID INNOMINATE UNI R MOD SED: IMG5359

## 2017-05-25 HISTORY — PX: IR ANGIO VERTEBRAL SEL SUBCLAVIAN INNOMINATE UNI R MOD SED: IMG5365

## 2017-05-25 HISTORY — PX: RADIOLOGY WITH ANESTHESIA: SHX6223

## 2017-05-25 LAB — COMPREHENSIVE METABOLIC PANEL
ALK PHOS: 64 U/L (ref 38–126)
ALT: 11 U/L — AB (ref 14–54)
ANION GAP: 14 (ref 5–15)
AST: 20 U/L (ref 15–41)
Albumin: 3.3 g/dL — ABNORMAL LOW (ref 3.5–5.0)
BUN: 11 mg/dL (ref 6–20)
CALCIUM: 9 mg/dL (ref 8.9–10.3)
CO2: 23 mmol/L (ref 22–32)
CREATININE: 0.74 mg/dL (ref 0.44–1.00)
Chloride: 102 mmol/L (ref 101–111)
Glucose, Bld: 118 mg/dL — ABNORMAL HIGH (ref 65–99)
Potassium: 3.9 mmol/L (ref 3.5–5.1)
Sodium: 139 mmol/L (ref 135–145)
Total Bilirubin: 0.7 mg/dL (ref 0.3–1.2)
Total Protein: 6 g/dL — ABNORMAL LOW (ref 6.5–8.1)

## 2017-05-25 LAB — POCT I-STAT 3, ART BLOOD GAS (G3+)
ACID-BASE DEFICIT: 3 mmol/L — AB (ref 0.0–2.0)
Bicarbonate: 22.1 mmol/L (ref 20.0–28.0)
O2 SAT: 99 %
PCO2 ART: 38.5 mmHg (ref 32.0–48.0)
PH ART: 7.366 (ref 7.350–7.450)
Patient temperature: 98.6
TCO2: 23 mmol/L (ref 22–32)
pO2, Arterial: 145 mmHg — ABNORMAL HIGH (ref 83.0–108.0)

## 2017-05-25 LAB — CBG MONITORING, ED: Glucose-Capillary: 109 mg/dL — ABNORMAL HIGH (ref 65–99)

## 2017-05-25 LAB — I-STAT CHEM 8, ED
BUN: 12 mg/dL (ref 6–20)
CALCIUM ION: 1.11 mmol/L — AB (ref 1.15–1.40)
CHLORIDE: 101 mmol/L (ref 101–111)
Creatinine, Ser: 0.6 mg/dL (ref 0.44–1.00)
GLUCOSE: 119 mg/dL — AB (ref 65–99)
HCT: 47 % — ABNORMAL HIGH (ref 36.0–46.0)
Hemoglobin: 16 g/dL — ABNORMAL HIGH (ref 12.0–15.0)
Potassium: 3.7 mmol/L (ref 3.5–5.1)
Sodium: 140 mmol/L (ref 135–145)
TCO2: 27 mmol/L (ref 22–32)

## 2017-05-25 LAB — CBC
HEMATOCRIT: 46.6 % — AB (ref 36.0–46.0)
Hemoglobin: 15.7 g/dL — ABNORMAL HIGH (ref 12.0–15.0)
MCH: 32.3 pg (ref 26.0–34.0)
MCHC: 33.7 g/dL (ref 30.0–36.0)
MCV: 95.9 fL (ref 78.0–100.0)
Platelets: 276 10*3/uL (ref 150–400)
RBC: 4.86 MIL/uL (ref 3.87–5.11)
RDW: 13.8 % (ref 11.5–15.5)
WBC: 10.6 10*3/uL — AB (ref 4.0–10.5)

## 2017-05-25 LAB — DIFFERENTIAL
BASOS PCT: 0 %
Basophils Absolute: 0 10*3/uL (ref 0.0–0.1)
EOS PCT: 1 %
Eosinophils Absolute: 0.1 10*3/uL (ref 0.0–0.7)
LYMPHS PCT: 30 %
Lymphs Abs: 3.1 10*3/uL (ref 0.7–4.0)
MONO ABS: 0.8 10*3/uL (ref 0.1–1.0)
MONOS PCT: 8 %
NEUTROS ABS: 6.5 10*3/uL (ref 1.7–7.7)
Neutrophils Relative %: 61 %

## 2017-05-25 LAB — MRSA PCR SCREENING: MRSA by PCR: NEGATIVE

## 2017-05-25 LAB — PROTIME-INR
INR: 1.03
PROTHROMBIN TIME: 13.4 s (ref 11.4–15.2)

## 2017-05-25 LAB — TRIGLYCERIDES: TRIGLYCERIDES: 66 mg/dL (ref <150)

## 2017-05-25 LAB — I-STAT TROPONIN, ED: TROPONIN I, POC: 0 ng/mL (ref 0.00–0.08)

## 2017-05-25 LAB — APTT: aPTT: 27 seconds (ref 24–36)

## 2017-05-25 SURGERY — IR WITH ANESTHESIA
Anesthesia: Choice

## 2017-05-25 MED ORDER — SODIUM CHLORIDE 0.9 % IV SOLN
INTRAVENOUS | Status: DC
  Administered 2017-05-25: 15:00:00 via INTRAVENOUS

## 2017-05-25 MED ORDER — CEFAZOLIN SODIUM-DEXTROSE 2-4 GM/100ML-% IV SOLN
INTRAVENOUS | Status: AC
  Filled 2017-05-25: qty 100

## 2017-05-25 MED ORDER — ACETAMINOPHEN 650 MG RE SUPP
650.0000 mg | RECTAL | Status: DC | PRN
  Administered 2017-05-27: 650 mg via RECTAL
  Filled 2017-05-25: qty 1

## 2017-05-25 MED ORDER — FAMOTIDINE IN NACL 20-0.9 MG/50ML-% IV SOLN
20.0000 mg | Freq: Two times a day (BID) | INTRAVENOUS | Status: DC
  Administered 2017-05-26: 20 mg via INTRAVENOUS
  Filled 2017-05-25: qty 50

## 2017-05-25 MED ORDER — PHENYLEPHRINE HCL 10 MG/ML IJ SOLN
INTRAVENOUS | Status: DC | PRN
  Administered 2017-05-25: 50 ug/min via INTRAVENOUS

## 2017-05-25 MED ORDER — PROPOFOL 1000 MG/100ML IV EMUL
5.0000 ug/kg/min | INTRAVENOUS | Status: DC
  Administered 2017-05-25: 15 ug/kg/min via INTRAVENOUS
  Administered 2017-05-26: 20 ug/kg/min via INTRAVENOUS
  Filled 2017-05-25 (×2): qty 100

## 2017-05-25 MED ORDER — IOPAMIDOL (ISOVUE-300) INJECTION 61%
INTRAVENOUS | Status: AC
  Filled 2017-05-25: qty 100

## 2017-05-25 MED ORDER — CEFAZOLIN SODIUM-DEXTROSE 2-3 GM-%(50ML) IV SOLR
INTRAVENOUS | Status: DC | PRN
  Administered 2017-05-25: 2 g via INTRAVENOUS

## 2017-05-25 MED ORDER — ACETAMINOPHEN 160 MG/5ML PO SOLN: 650.0000 mg | ORAL | Status: DC | PRN

## 2017-05-25 MED ORDER — HEPARIN SODIUM (PORCINE) 5000 UNIT/ML IJ SOLN
5000.0000 [IU] | Freq: Three times a day (TID) | INTRAMUSCULAR | Status: DC
  Administered 2017-05-26 – 2017-06-01 (×20): 5000 [IU] via SUBCUTANEOUS
  Filled 2017-05-25 (×20): qty 1

## 2017-05-25 MED ORDER — PROPOFOL 10 MG/ML IV BOLUS
INTRAVENOUS | Status: DC | PRN
  Administered 2017-05-25: 70 mg via INTRAVENOUS
  Administered 2017-05-25: 30 mg via INTRAVENOUS

## 2017-05-25 MED ORDER — SODIUM CHLORIDE 0.9 % IV SOLN
INTRAVENOUS | Status: DC
  Administered 2017-05-25 (×2): via INTRAVENOUS

## 2017-05-25 MED ORDER — IOPAMIDOL (ISOVUE-300) INJECTION 61%
INTRAVENOUS | Status: AC
  Administered 2017-05-25: 25 mL
  Filled 2017-05-25: qty 150

## 2017-05-25 MED ORDER — NITROGLYCERIN 1 MG/10 ML FOR IR/CATH LAB
INTRA_ARTERIAL | Status: AC
  Filled 2017-05-25: qty 10

## 2017-05-25 MED ORDER — CHLORHEXIDINE GLUCONATE 0.12% ORAL RINSE (MEDLINE KIT): 15.0000 mL | Freq: Two times a day (BID) | OROMUCOSAL | Status: DC

## 2017-05-25 MED ORDER — EPTIFIBATIDE 20 MG/10ML IV SOLN
INTRAVENOUS | Status: AC
  Filled 2017-05-25: qty 10

## 2017-05-25 MED ORDER — SUCCINYLCHOLINE CHLORIDE 200 MG/10ML IV SOSY
PREFILLED_SYRINGE | INTRAVENOUS | Status: DC | PRN
  Administered 2017-05-25: 70 mg via INTRAVENOUS

## 2017-05-25 MED ORDER — IOPAMIDOL (ISOVUE-300) INJECTION 61%
INTRAVENOUS | Status: AC
  Administered 2017-05-25: 75 mL
  Filled 2017-05-25: qty 150

## 2017-05-25 MED ORDER — CHLORHEXIDINE GLUCONATE 0.12% ORAL RINSE (MEDLINE KIT)
15.0000 mL | Freq: Two times a day (BID) | OROMUCOSAL | Status: DC
  Administered 2017-05-25 – 2017-05-28 (×5): 15 mL via OROMUCOSAL

## 2017-05-25 MED ORDER — ROCURONIUM BROMIDE 100 MG/10ML IV SOLN
INTRAVENOUS | Status: DC | PRN
  Administered 2017-05-25: 30 mg via INTRAVENOUS
  Administered 2017-05-25: 10 mg via INTRAVENOUS
  Administered 2017-05-25: 40 mg via INTRAVENOUS
  Administered 2017-05-25: 20 mg via INTRAVENOUS
  Administered 2017-05-25: 30 mg via INTRAVENOUS

## 2017-05-25 MED ORDER — ATORVASTATIN CALCIUM 80 MG PO TABS: 80.0000 mg | ORAL_TABLET | Freq: Every day | ORAL | Status: DC

## 2017-05-25 MED ORDER — PHENYLEPHRINE HCL 10 MG/ML IJ SOLN
INTRAMUSCULAR | Status: DC | PRN
  Administered 2017-05-25: 120 ug via INTRAVENOUS
  Administered 2017-05-25: 40 ug via INTRAVENOUS
  Administered 2017-05-25: 80 ug via INTRAVENOUS

## 2017-05-25 MED ORDER — ASPIRIN 325 MG PO TABS
325.0000 mg | ORAL_TABLET | Freq: Every day | ORAL | Status: DC
  Administered 2017-05-26 – 2017-06-01 (×6): 325 mg via ORAL
  Filled 2017-05-25 (×6): qty 1

## 2017-05-25 MED ORDER — IOPAMIDOL (ISOVUE-370) INJECTION 76%
INTRAVENOUS | Status: AC
  Administered 2017-05-25: 90 mL
  Filled 2017-05-25: qty 100

## 2017-05-25 MED ORDER — LIDOCAINE 2% (20 MG/ML) 5 ML SYRINGE
INTRAMUSCULAR | Status: DC | PRN
  Administered 2017-05-25: 40 mg via INTRAVENOUS

## 2017-05-25 MED ORDER — FENTANYL CITRATE (PF) 100 MCG/2ML IJ SOLN
INTRAMUSCULAR | Status: DC | PRN
  Administered 2017-05-25: 100 ug via INTRAVENOUS

## 2017-05-25 MED ORDER — PROPOFOL 500 MG/50ML IV EMUL
INTRAVENOUS | Status: DC | PRN
  Administered 2017-05-25: 50 ug/kg/min via INTRAVENOUS

## 2017-05-25 MED ORDER — NICARDIPINE HCL IN NACL 20-0.86 MG/200ML-% IV SOLN: 0.0000 mg/h | INTRAVENOUS | Status: DC

## 2017-05-25 MED ORDER — ORAL CARE MOUTH RINSE
15.0000 mL | OROMUCOSAL | Status: DC
  Administered 2017-05-25 – 2017-05-26 (×7): 15 mL via OROMUCOSAL

## 2017-05-25 MED ORDER — PHENYLEPHRINE HCL 10 MG/ML IJ SOLN
0.0000 ug/min | INTRAMUSCULAR | Status: DC
  Administered 2017-05-25: 35 ug/min via INTRAVENOUS
  Administered 2017-05-25: 50 ug/min via INTRAVENOUS
  Administered 2017-05-26: 30 ug/min via INTRAVENOUS
  Administered 2017-05-26: 55 ug/min via INTRAVENOUS
  Administered 2017-05-26: 30 ug/min via INTRAVENOUS
  Administered 2017-05-26 (×2): 40 ug/min via INTRAVENOUS
  Administered 2017-05-27 (×2): 35 ug/min via INTRAVENOUS
  Administered 2017-05-27: 25 ug/min via INTRAVENOUS
  Administered 2017-05-28: 30 ug/min via INTRAVENOUS
  Administered 2017-05-28: 20 ug/min via INTRAVENOUS
  Administered 2017-05-28: 35 ug/min via INTRAVENOUS
  Administered 2017-05-28: 25 ug/min via INTRAVENOUS
  Administered 2017-05-29: 20 ug/min via INTRAVENOUS
  Filled 2017-05-25 (×3): qty 10
  Filled 2017-05-25 (×3): qty 1
  Filled 2017-05-25 (×3): qty 10
  Filled 2017-05-25: qty 1
  Filled 2017-05-25: qty 10
  Filled 2017-05-25: qty 1
  Filled 2017-05-25: qty 10
  Filled 2017-05-25: qty 1
  Filled 2017-05-25 (×2): qty 10

## 2017-05-25 MED ORDER — ORAL CARE MOUTH RINSE
15.0000 mL | Freq: Four times a day (QID) | OROMUCOSAL | Status: DC
  Administered 2017-05-26 – 2017-05-29 (×8): 15 mL via OROMUCOSAL

## 2017-05-25 MED ORDER — PHENYLEPHRINE 40 MCG/ML (10ML) SYRINGE FOR IV PUSH (FOR BLOOD PRESSURE SUPPORT)
PREFILLED_SYRINGE | INTRAVENOUS | Status: DC | PRN
  Administered 2017-05-25: 160 ug via INTRAVENOUS

## 2017-05-25 MED ORDER — STROKE: EARLY STAGES OF RECOVERY BOOK
Freq: Once | Status: DC
  Filled 2017-05-25 (×2): qty 1

## 2017-05-25 MED ORDER — ACETAMINOPHEN 325 MG PO TABS
650.0000 mg | ORAL_TABLET | ORAL | Status: DC | PRN
  Administered 2017-05-31 – 2017-06-01 (×3): 650 mg via ORAL
  Filled 2017-05-25 (×5): qty 2

## 2017-05-25 NOTE — Anesthesia Procedure Notes (Signed)
Arterial Line Insertion Start/End1/22/2019 12:23 PM Performed by: Oletta Lamas, CRNA, CRNA  Patient location: OR. Emergency situation radial was placed Catheter size: 20 G Hand hygiene performed  and maximum sterile barriers used   Attempts: 1 Procedure performed without using ultrasound guided technique. Following insertion, Biopatch and dressing applied. Post procedure assessment: normal  Patient tolerated the procedure well with no immediate complications.

## 2017-05-25 NOTE — Code Documentation (Addendum)
84yo female arriving to [facility] via [place] at 1116. Patient from home where she was found this morning with altered LOC. Patient was LKW at 53 yesterday when she spoke with her daughter on the phone. Another daughter attempted to call patient this morning at 0900 with no response. EMS called and assessed right gaze, left hemiplegia and slurred speech and activated a code stroke for LVO score 5. Of note, patient lives home alone and is independent per family. Stroke team at the bedside on patient arrival.  Labs drawn and patient to CT with stroke team. NIHSS 22, see documentation for details and code stroke times. Patient with right gaze preference, left facial droop, left hemiplegia and left neglect on exam. Patient is outside the window for treatment with tPA. CT completed followed by CTP and CTA imaging. VSS.  2nd PIV placed.  Plan of care and treatment discussed with patient's daughter.  Patient transferred to IR by ED RN and stroke RN.  Procedure consent obtained from patient's daughter. Handoff with IR team. Patient to be admitted to ICU post-procedure.

## 2017-05-25 NOTE — Anesthesia Procedure Notes (Signed)
Procedure Name: Intubation Date/Time: 05/25/2017 12:14 PM Performed by: Leonor Liv, CRNA Pre-anesthesia Checklist: Patient identified, Emergency Drugs available, Suction available and Patient being monitored Patient Re-evaluated:Patient Re-evaluated prior to induction Oxygen Delivery Method: Circle System Utilized Preoxygenation: Pre-oxygenation with 100% oxygen Induction Type: IV induction Ventilation: Mask ventilation without difficulty Laryngoscope Size: Mac and 3 Grade View: Grade II Tube type: Oral Number of attempts: 2 (CRNA x1 MD x1) Airway Equipment and Method: Stylet and Oral airway Placement Confirmation: ETT inserted through vocal cords under direct vision,  positive ETCO2 and breath sounds checked- equal and bilateral Secured at: 21 (left) cm Tube secured with: Tape Dental Injury: Teeth and Oropharynx as per pre-operative assessment

## 2017-05-25 NOTE — Progress Notes (Signed)
Patient ID: MALEEA CAMILO, female   DOB: 1923/07/06, 84 y.o.   MRN: [id number] 84 yr old RT F LSW  700pm  Discovered at 9 am today with Rt gaze deviationLt hemiplegia and unresponsive. CT Brain No ICH ASPECTS 7  Premorbid  Modified rankin 0  CTA  Occluded RT ICA prox to terminus with  RT MCA M1 occlusion.Patent ACOM . CTP . CBF  < 30 % vol 8.Tmax> 6.0 s vol 130 ml Mismatch vol 122 .Mismatch ratio 16.2   Option of endovascular treatment to improve blood flow to  RT side of brain to prevent further neurological injury discussed with daughter. Procedure,reasons,risks,alternatives all reviewed in detail. Risks of ICH of 10 %,worsening neurological function vent dependency,death and inability to revascularize were all discussed in detail. Questions answered with daughter expressing understanding of proposed treatment. Informed witnessed consent obtained from daughter for treatment. S.Shaarav Ripple MD

## 2017-05-25 NOTE — Progress Notes (Signed)
Patient transported to MRI and back to 4N24 without any complications.

## 2017-05-25 NOTE — H&P (Signed)
STROKE H&P  CC: Left-sided weakness, right gaze deviation  History is obtained from: Chart, patient, patient's daughter at bedside  HPI: Chloe Lopez is a 84 y.o. female who has a past medical history of atrial fibrillation not on anticoagulation, peripheral neuropathy who is functionally independent at baseline, lives at home, who was in her usual state of health to the best of family's knowledge at 7 PM last night when the daughter spoke with her, and found this morning to be weak on the left side and gazing to the right.  It is unclear if she was seen anytime after 7 PM until being found this morning with left-sided weakness.  The daughter attempted to contact other family members who might have been in touch with her, but no information was available. The daughter says the patient could not move her left arm or leg, was getting to the right had left-sided facial weakness and droop along with slurred speech.  EMS was called.  They also noted left hemiplegia, right gaze preference and left facial weakness along with dysarthria. She was brought into the emergency room.  Taken in for a noncontrast CT of the head that did not reveal any acute bleed but showed a hyperdense right MCA.  She was outside the window for IV TPA hence did not get TPA.  CT angiogram of the head and neck and CT perfusion study was done that showed right ICA occlusion at the bifurcation and a CT perfusion study that showed to 8 cc core and 132 cc area at risk.  Risks benefits of neuro intervention were discussed with the daughter and she was taken in for diagnostic cerebral angiogram and possible thrombectomy.   LKW: 1900 hrs. on 05/25/2017 tpa given?: no, outside the window Premorbid modified Rankin scale (mRS): 0  ROS: Unable to obtain due to altered mental status.   Past Medical History:  Diagnosis Date  . Angioedema   . Depression   . Dysphagia, pharyngoesophageal phase 07/07/2012  . Left hip pain 07/22/2011  . Leg  swelling 11/02/2012  . Sciatica 08/21/2011   Started recently in left hip. Takes tramadol. Does not want surgery. Happens once a week or so.    . Sciatica 04/19/2013  . Sweating 01/21/2012   Might relate to Doxepin   . Urticaria    Atrial fibrillation-not on anticoagulation Peripheral neuropathy  No family history on file. No family history of stroke.  Social History:   reports that she quit smoking about 21 years ago. she has never used smokeless tobacco. She reports that she drinks about 1.2 oz of alcohol per week. She reports that she does not use drugs.  Medications  Current Facility-Administered Medications:  .   stroke: mapping our early stages of recovery book, , Does not apply, Once, Amie Portland, MD .  0.9 %  sodium chloride infusion, , Intravenous, Continuous, Amie Portland, MD .  acetaminophen (TYLENOL) tablet 650 mg, 650 mg, Oral, Q4H PRN **OR** acetaminophen (TYLENOL) solution 650 mg, 650 mg, Per Tube, Q4H PRN **OR** acetaminophen (TYLENOL) suppository 650 mg, 650 mg, Rectal, Q4H PRN, Amie Portland, MD .  heparin injection 5,000 Units, 5,000 Units, Subcutaneous, Q8H, Amie Portland, MD .  iopamidol (ISOVUE-300) 61 % injection, , , ,  .  iopamidol (ISOVUE-300) 61 % injection, , , ,   Current Outpatient Medications:  .  CARTIA XT 180 MG 24 hr capsule, TAKE ONE CAPSULE BY MOUTH DAILY, Disp: 30 capsule, Rfl: 4 .  doxepin (SINEQUAN) 10 MG  capsule, Take 3 capsules (30 mg total) by mouth at bedtime., Disp: 270 capsule, Rfl: 1 .  EPINEPHrine (EPIPEN 2-PAK) 0.3 mg/0.3 mL IJ SOAJ injection, Inject 0.3 mg into the muscle once. Reported on 08/30/2015, Disp: , Rfl:  .  Multiple Vitamins-Minerals (ONE-A-DAY 50 PLUS PO), Take 1 tablet by mouth daily. , Disp: , Rfl:  .  predniSONE (DELTASONE) 10 MG tablet, TAKE AS DIRECTED. ALTERNATE 10 MG ONE DAY THEN 5 MG THE NEXT., Disp: 45 tablet, Rfl: 1 .  predniSONE (DELTASONE) 5 MG tablet, TAKE AS DIRECTED. ALTERNATE EVERY OTHER DAY WITH 10 MG., Disp:  45 tablet, Rfl: 1 .  ranitidine (ZANTAC) 150 MG tablet, Take one tablet twice a day. (Patient taking differently: Take 150 mg by mouth 2 (two) times daily. ), Disp: 180 tablet, Rfl: 1 .  traMADol (ULTRAM) 50 MG tablet, Take 1 tablet (50 mg total) daily as needed by mouth (pain in legs)., Disp: 30 tablet, Rfl: 3   Exam: Current vital signs: BP 117/68 (BP Location: Right Arm)   Pulse 85   Temp 98 F (36.7 C) (Oral)   Resp 16   SpO2 98%  Vital signs in last 24 hours: Temp:  [98 F (36.7 C)] 98 F (36.7 C) (01/22 1127) Pulse Rate:  [85] 85 (01/22 1127) Resp:  [16] 16 (01/22 1127) BP: (117)/(68) 117/68 (01/22 1127) SpO2:  [98 %] 98 % (01/22 1127) General: Awake alert oriented x3 HEENT: Normocephalic atraumatic dry mucous membranes Lungs clear to auscultation Cardiovascular: S1-S2 heard, irregularly irregular, no murmur rub gallop Extremities warm well perfused with intact pulses Neurological exam Patient is awake alert oriented x3 Speech is dysarthric Naming is mildly impaired but unclear if that is due to inattention. Comprehension intact. She is fluent. Cranial nerves: Pupils equal round reactive to light, right gaze preference able to cross midline to look at the left, left homonymous hemianopsia, left lower facial weakness, decreased sensation in the left side of the face. Motor exam: 0/5 left upper extremity, 1/5 left lower extremity, 5/5 right upper and right lower extremities. Sensory exam: Dense loss with neglect on the left side. Coordination: Intact finger nose finger on the right, cannot perform on the left. Gait exam was deferred  NIHSS 1a Level of Conscious.: 0 1b LOC Questions: 0 1c LOC Commands: 0 2 Best Gaze: 1 3 Visual: 2 4 Facial Palsy: 2 5a Motor Arm - left: 4 5b Motor Arm - Right: 0 6a Motor Leg - Left: 3 6b Motor Leg - Right: 0 7 Limb Ataxia: 0 8 Sensory: 2 9 Best Language: 1 10 Dysarthria: 2 11 Extinct. and Inatten.: 2 TOTAL: 19  Labs I have  reviewed labs in epic and the results pertinent to this consultation are:  CBC    Component Value Date/Time   WBC 10.6 (H) 05/25/2017 1118   RBC 4.86 05/25/2017 1118   HGB 16.0 (H) 05/25/2017 1124   HGB 14.8 06/19/2015 1021   HGB 14.8 04/04/2008 1057   HCT 47.0 (H) 05/25/2017 1124   HCT 43.9 06/19/2015 1021   HCT 43.6 04/04/2008 1057   PLT 276 05/25/2017 1118   PLT 290 04/04/2008 1057   MCV 95.9 05/25/2017 1118   MCV 93 06/19/2015 1021   MCV 94.6 04/04/2008 1057   MCH 32.3 05/25/2017 1118   MCHC 33.7 05/25/2017 1118   RDW 13.8 05/25/2017 1118   RDW 14.2 06/19/2015 1021   RDW 13.6 04/04/2008 1057   LYMPHSABS 3.1 05/25/2017 1118   LYMPHSABS 2.2 06/19/2015 1021  LYMPHSABS 3.1 04/04/2008 1057   MONOABS 0.8 05/25/2017 1118   MONOABS 0.7 04/04/2008 1057   EOSABS 0.1 05/25/2017 1118   EOSABS 0.0 06/19/2015 1021   BASOSABS 0.0 05/25/2017 1118   BASOSABS 0.0 06/19/2015 1021   BASOSABS 0.0 04/04/2008 1057    CMP     Component Value Date/Time   NA 140 05/25/2017 1124   NA 142 06/19/2015 1021   K 3.7 05/25/2017 1124   CL 101 05/25/2017 1124   CO2 26 08/26/2016 0256   GLUCOSE 119 (H) 05/25/2017 1124   BUN 12 05/25/2017 1124   BUN 12 06/19/2015 1021   CREATININE 0.60 05/25/2017 1124   CREATININE 0.93 03/13/2013 1011   CALCIUM 9.1 08/26/2016 0256   PROT 6.6 06/19/2015 1021   ALBUMIN 4.1 06/19/2015 1021   AST 15 06/19/2015 1021   ALT 9 06/19/2015 1021   ALKPHOS 73 06/19/2015 1021   BILITOT <0.2 06/19/2015 1021   GFRNONAA >60 08/26/2016 0256   GFRAA >60 08/26/2016 0256    Lipid Panel     Component Value Date/Time   CHOL 171 08/26/2016 0256   TRIG 77 08/26/2016 0256   HDL 68 08/26/2016 0256   CHOLHDL 2.5 08/26/2016 0256   VLDL 15 08/26/2016 0256   LDLCALC 88 08/26/2016 0256     Imaging I have reviewed the images obtained: CT-scan of the brain -hyperdense right MCA.  Aspects 7-8. CT Angie of head and neck-right ICA occlusion and bifurcation.  Fetal right PCA.   Right M1 occlusion with reconstitution of right MCA branches beyond bifurcation suggesting collateral.  No significant left-sided stenosis.  Aortic atherosclerosis. CT perfusion study shows a large ischemic penumbra of around 130 cc with a small core of 8 cc in the right MCA territory.  This is corresponding to the right ICA and right M1 occlusion seen on the CTA.  Assessment:  84 year old woman with past history of atrial fibrillation not on anticoagulation, peripheral neuropathy functionally dependent at baseline lives at home in her usual state of health last known normal at 7 PM last night was found to have left-sided weakness and right gaze preference along with dysarthria and left facial weakness. NIH stroke scale 19 for a right MCA syndrome. Noncontrast CT of the head shows no bleed.  CT aspects 728.  CT angiogram head and neck showed right ICA occlusion and right MCA occlusion in the M1 segment.  CT perfusion profile favorable for emergent intervention because of a small 8 cc core and large ischemic penumbra 130 cc.  Not a candidate for TPA because of outside the window. Interventional team contacted, case discussed and patient going for diagnostic cerebral angiogram and possible thrombectomy. Discussed with family in detail.  IMPRESSION: Acute Ischemic Stroke Cerebral infarction due to embolism of right middle cerebral artery Occlusion and stenosis of R carotid artery  Acuity: Acute Current Suspected Etiology: Cardioembolic Continue Evaluation:  -Admit to: Neuro ICU -Continue Aspirin/ Statin -Blood pressure control, goal of SYS <220 unless successful thrombectomy done.  If successful thrombectomy, goal blood pressure systolic [phone] -[phone]. -QIH/KVQQ/V9D/GLOVF panel. -Hyperglycemia management per SSI to maintain glucose 140-180mg /dL. -PT/OT/ST therapies and recommendations when able  CNS Close neuro monitoring  Dysphagia dysarthria -N.p.o. -Speech therapy  Hemiplegia and  hemiparesis following cerebral infarction affecting left non-dominant side  -PT/OT  RESP Acute Respiratory Failure  -vent management per ICU -wean when able   CV Blood pressure goal as above TTE  Hyperlipidemia, unspecified  - Statin for goal LDL < 70  Chronic atrial  fibrillation -Rate control -Decision on anticoagulation per stroke team based on clinical course and stroke size  HEME Hemoglobin 16 -Monitor -transfuse for hgb < 7 -Trend PT/PTT/INR  ENDO -goal HgbA1c < 7  GI/GU -Gentle hydration  Fluid/Electrolyte Disorders Check labs Replete per ICU protocol  ID Possible Aspiration PNA -CXR -NPO -Monitor ICU consult for vent and medical management   Prophylaxis DVT:  Heparin sq GI: PPI Bowel: doc/senna  Diet: NPO until cleared by speech  Code Status: Full Code for procedure. Was DNR prior to arrival. Needs to be addressed with family.  -- Amie Portland, MD [facility] Pager: [phone] If 7pm to 7am, please call on call as listed on AMION.   CRITICAL CARE ATTESTATION This patient is critically ill and at significant risk of neurological worsening, death and care requires constant monitoring of vital signs, hemodynamics,respiratory and cardiac monitoring. I spent 50  minutes of neurocritical care time performing neurological assessment, discussion with family, other specialists and medical decision making of high complexityin the care of  this patient.

## 2017-05-25 NOTE — Sedation Documentation (Signed)
Pt brought to IR #2 on stretcher from CT scan. Awake and alert. Confused. In no distress. States her right arm and leg hurt.

## 2017-05-25 NOTE — Procedures (Signed)
S/P bilateral common carotid arteriograms followed by attempted revascularization of occluded RT ICA prox and at the terminus with a T occlusion

## 2017-05-25 NOTE — Transfer of Care (Signed)
Immediate Anesthesia Transfer of Care Note  Patient: Chloe Lopez  Procedure(s) Performed: IR WITH ANESTHESIA CODE STROKE (N/A )  Patient Location: ICU  Anesthesia Type:General  Level of Consciousness: Patient remains intubated per anesthesia plan  Airway & Oxygen Therapy: Patient remains intubated per anesthesia plan and Patient placed on Ventilator (see vital sign flow sheet for setting)  Post-op Assessment: Report given to RN and Post -op Vital signs reviewed and stable  Post vital signs: Reviewed and stable  Last Vitals:  Vitals:   05/25/17 1127 05/25/17 1145  BP: 117/68 124/72  Pulse: 85 86  Resp: 16 18  Temp: 36.7 C   SpO2: 98% 99%    Last Pain:  Vitals:   05/25/17 1127  TempSrc: Oral         Complications: No apparent anesthesia complications

## 2017-05-25 NOTE — ED Provider Notes (Signed)
[facility] EMERGENCY DEPARTMENT Provider Note   CSN: [id number] Arrival date & time: 05/25/17  1116     History   Chief Complaint Chief Complaint  Patient presents with  . Code Stroke    HPI Chloe Lopez is a 84 y.o. female.  Pt presents to the ED today as a code stroke.  Pt's daughter said she last spoke with her last night.  This morning, she went over to give her some flowers as she just had a birthday.  She found her on the ground not moving her left side.  Her daughter called EMS.      Past Medical History:  Diagnosis Date  . Angioedema   . Depression   . Dysphagia, pharyngoesophageal phase 07/07/2012  . Left hip pain 07/22/2011  . Leg swelling 11/02/2012  . Sciatica 08/21/2011   Started recently in left hip. Takes tramadol. Does not want surgery. Happens once a week or so.    . Sciatica 04/19/2013  . Sweating 01/21/2012   Might relate to Doxepin   . Urticaria     Patient Active Problem List   Diagnosis Date Noted  . Stroke (cerebrum) ([place]) 05/25/2017  . Atrial fibrillation ([place]) 08/25/2016  . Seborrheic keratoses 08/25/2016  . Age-related cognitive decline 06/04/2016  . Current chronic use of systemic steroids 06/04/2016  . 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 Varicose veins of both lower extremities with complications 18/56/3149  . Dysthymia 07/15/2015  . Open angle with borderline findings, low risk, bilateral 03/25/2015  . Peripheral neuropathy 09/10/2014  . Venous (peripheral) insufficiency 11/10/2012  . Hearing loss 11/10/2012  . AMD (age related macular degeneration) 11/23/2011  . Pseudophakia 11/23/2011  . History of breast cancer in female 03/25/2009  . URTICARIA-HIVES/Angioedema 07/01/2006  . Osteopenia 07/01/2006    Past Surgical History:  Procedure Laterality Date  . ABDOMINAL HYSTERECTOMY    . MASTECTOMY, PARTIAL  2009    OB History    No data available       Home Medications    Prior to Admission  medications   Medication Sig Start Date End Date Taking? Authorizing Provider  CARTIA XT 180 MG 24 hr capsule TAKE ONE CAPSULE BY MOUTH DAILY 03/31/17   Leeanne Rio, MD  doxepin (SINEQUAN) 10 MG capsule Take 3 capsules (30 mg total) by mouth at bedtime. 08/18/16   Kozlow, Donnamarie Poag, MD  EPINEPHrine (EPIPEN 2-PAK) 0.3 mg/0.3 mL IJ SOAJ injection Inject 0.3 mg into the muscle once. Reported on 08/30/2015    [provider]  Multiple Vitamins-Minerals (ONE-A-DAY 50 PLUS PO) Take 1 tablet by mouth daily.     [provider]  predniSONE (DELTASONE) 10 MG tablet TAKE AS DIRECTED. ALTERNATE 10 MG ONE DAY THEN 5 MG THE NEXT. 04/30/17   Kozlow, Donnamarie Poag, MD  predniSONE (DELTASONE) 5 MG tablet TAKE AS DIRECTED. ALTERNATE EVERY OTHER DAY WITH 10 MG. 04/30/17   Kozlow, Donnamarie Poag, MD  ranitidine (ZANTAC) 150 MG tablet Take one tablet twice a day. Patient taking differently: Take 150 mg by mouth 2 (two) times daily.  08/18/16   Kozlow, Donnamarie Poag, MD  traMADol (ULTRAM) 50 MG tablet Take 1 tablet (50 mg total) daily as needed by mouth (pain in legs). 03/19/17   Leeanne Rio, MD    Family History No family history on file.  Social History Social History   Tobacco Use  . Smoking status: Former Smoker    Last attempt to quit: 12/15/1995  Years since quitting: 21.4  . Smokeless tobacco: Never Used  Substance Use Topics  . Alcohol use: Yes    Alcohol/week: 1.2 oz    Types: 2 Standard drinks or equivalent per week  . Drug use: No     Allergies   Azithromycin and Benzonatate   Review of Systems Review of Systems  Neurological:       Right sided weakness     Physical Exam Updated Vital Signs BP 124/72 (BP Location: Right Arm)   Pulse 86   Temp 98 F (36.7 C) (Oral)   Resp 18   SpO2 99%   Physical Exam  Constitutional: She appears well-developed and well-nourished.  HENT:  Head: Normocephalic and atraumatic.  Right Ear: External ear normal.  Left Ear: External  ear normal.  Nose: Nose normal.  Mouth/Throat: Oropharynx is clear and moist.  Eyes: Conjunctivae are normal. Pupils are equal, round, and reactive to light.  Neck: Normal range of motion. Neck supple.  Cardiovascular: Normal rate, regular rhythm, normal heart sounds and intact distal pulses.  Pulmonary/Chest: Effort normal and breath sounds normal.  Abdominal: Soft. Bowel sounds are normal.  Musculoskeletal: Normal range of motion.  Neurological: She is alert.  Left sided weakness  Skin: Skin is warm. Capillary refill takes less than 2 seconds.  Nursing note and vitals reviewed.    ED Treatments / Results  Labs (all labs ordered are listed, but only abnormal results are displayed) Labs Reviewed  CBC - Abnormal; Notable for the following components:      Result Value   WBC 10.6 (*)    Hemoglobin 15.7 (*)    HCT 46.6 (*)    All other components within normal limits  COMPREHENSIVE METABOLIC PANEL - Abnormal; Notable for the following components:   Glucose, Bld 118 (*)    Total Protein 6.0 (*)    Albumin 3.3 (*)    ALT 11 (*)    All other components within normal limits  I-STAT CHEM 8, ED - Abnormal; Notable for the following components:   Glucose, Bld 119 (*)    Calcium, Ion 1.11 (*)    Hemoglobin 16.0 (*)    HCT 47.0 (*)    All other components within normal limits  PROTIME-INR  APTT  DIFFERENTIAL  CBC  CREATININE, SERUM  I-STAT TROPONIN, ED  CBG MONITORING, ED    EKG  EKG Interpretation None       Radiology Ct Angio Head W Or Wo Contrast  Result Date: 05/25/2017 CLINICAL DATA:  Focal neuro deficit for left 6 hours. Stroke suspected. New onset of left-sided weakness, slurred speech, right-sided gaze. Symptoms began at 7 p.m. last evening. EXAM: CT ANGIOGRAPHY HEAD AND NECK TECHNIQUE: Multidetector CT imaging of the head and neck was performed using the standard protocol during bolus administration of intravenous contrast. Multiplanar CT image reconstructions  and MIPs were obtained to evaluate the vascular anatomy. Carotid stenosis measurements (when applicable) are obtained utilizing NASCET criteria, using the distal internal carotid diameter as the denominator. CONTRAST:  97mL ISOVUE-370 IOPAMIDOL (ISOVUE-370) INJECTION 76% COMPARISON:  CT head without contrast from the same day. MRI brain 01/03/2011 FINDINGS: CTA NECK FINDINGS Aortic arch: A 4 vessel aortic arch is present. The left vertebral artery originates directly from the aorta. Atherosclerotic calcifications are present at the aorta. There is no aneurysm. There is no focal stenosis of the great vessel origins. Right carotid system: The right common carotid artery is within normal limits. The right internal carotid artery is occluded  at the bifurcation. There is no reconstitution in the neck. Left carotid system: The left common carotid artery is within normal limits. Bifurcation is unremarkable. The cervical left ICA demonstrates moderate tortuosity without a significant stenosis of greater than 50% relative to the more distal vessel. Vertebral arteries: The left vertebral artery originates from the aortic arch. The right vertebral artery is slightly dominant. It originates from the right subclavian artery without a significant stenosis. No significant focal stenosis or vascular injury to either vertebral artery in the neck. Skeleton: Chronic endplate degenerative changes and loss of disc height are present at C4-5, C5-6, and C6-7. Foraminal narrowing is greatest on the left at C5-6. No focal lytic or blastic lesions are present. Other neck: Calcifications palatine tonsils are compatible with prior infection. No focal mucosal or submucosal lesions are present. Thyroid is heterogeneous without a dominant lesion. No significant adenopathy is present. Salivary glands are within normal limits. Upper chest: The lung apices are clear. Thoracic inlet is within normal limits. Review of the MIP images confirms the above  findings CTA HEAD FINDINGS Anterior circulation: Atherosclerotic calcifications are present within the cavernous left internal carotid artery without a significant stenosis through the ICA terminus. The right ICA terminus fills via a right posterior communicating artery. The right M1 artery also contribute. There is retrograde filling of the right A1 segment the anterior communicating artery. Left A1 and M1 segments are normally opacified. The right M1 segment is occluded. There is reconstitution of right MCA branch vessels at the bifurcation suggesting. Collateral flow. There is some attenuation of right MCA branch vessels compared to left. ACA branch vessels are intact bilaterally. No aneurysms. Posterior circulation: The right vertebral artery is slightly dominant to the left. PICA origins are visualized and normal. The vertebrobasilar junction is normal. The basilar artery is normal. The left posterior cerebral artery originates from basilar tip. Right posterior cerebral artery is of fetal type with a small right P1 segment now filling the artery. PCA branch vessels are within normal limits bilaterally. Venous sinuses: The dural sinuses are patent. The left transverse sinus is dominant. The straight sinus and deep cerebral veins are intact. Cortical veins are unremarkable. Anatomic variants: Fetal type right posterior cerebral artery, now fed by a small right P1 segment. Delayed phase: Not performed in the emergency set. Review of the MIP images confirms the above findings IMPRESSION: 1. The right internal carotid artery is occluded at the carotid bifurcation. 2. The fetal type right posterior cerebral artery fills via a small right P1 segment to the right ICA terminus. 3. Right M1 occlusion with reconstitution of right MCA branch vessels beyond the bifurcation suggesting collateral flow. 4. Right A1 segment flow is likely retrograde. 5. No significant left-sided stenosis. 6. Aortic Atherosclerosis  (ICD10-I70.0). No significant stenosis or aneurysm at the aortic arch. 7. Multilevel spondylosis of the cervical spine. These results were called by telephone at the time of interpretation on 05/25/2017 at 11:58 Am to Dr. Amie Portland , who verbally acknowledged these results. Electronically Signed   By: San Morelle M.D.   On: 05/25/2017 12:07   Ct Angio Neck W Or Wo Contrast  Result Date: 05/25/2017 CLINICAL DATA:  Focal neuro deficit for left 6 hours. Stroke suspected. New onset of left-sided weakness, slurred speech, right-sided gaze. Symptoms began at 7 p.m. last evening. EXAM: CT ANGIOGRAPHY HEAD AND NECK TECHNIQUE: Multidetector CT imaging of the head and neck was performed using the standard protocol during bolus administration of intravenous contrast. Multiplanar CT  image reconstructions and MIPs were obtained to evaluate the vascular anatomy. Carotid stenosis measurements (when applicable) are obtained utilizing NASCET criteria, using the distal internal carotid diameter as the denominator. CONTRAST:  13mL ISOVUE-370 IOPAMIDOL (ISOVUE-370) INJECTION 76% COMPARISON:  CT head without contrast from the same day. MRI brain 01/03/2011 FINDINGS: CTA NECK FINDINGS Aortic arch: A 4 vessel aortic arch is present. The left vertebral artery originates directly from the aorta. Atherosclerotic calcifications are present at the aorta. There is no aneurysm. There is no focal stenosis of the great vessel origins. Right carotid system: The right common carotid artery is within normal limits. The right internal carotid artery is occluded at the bifurcation. There is no reconstitution in the neck. Left carotid system: The left common carotid artery is within normal limits. Bifurcation is unremarkable. The cervical left ICA demonstrates moderate tortuosity without a significant stenosis of greater than 50% relative to the more distal vessel. Vertebral arteries: The left vertebral artery originates from the aortic  arch. The right vertebral artery is slightly dominant. It originates from the right subclavian artery without a significant stenosis. No significant focal stenosis or vascular injury to either vertebral artery in the neck. Skeleton: Chronic endplate degenerative changes and loss of disc height are present at C4-5, C5-6, and C6-7. Foraminal narrowing is greatest on the left at C5-6. No focal lytic or blastic lesions are present. Other neck: Calcifications palatine tonsils are compatible with prior infection. No focal mucosal or submucosal lesions are present. Thyroid is heterogeneous without a dominant lesion. No significant adenopathy is present. Salivary glands are within normal limits. Upper chest: The lung apices are clear. Thoracic inlet is within normal limits. Review of the MIP images confirms the above findings CTA HEAD FINDINGS Anterior circulation: Atherosclerotic calcifications are present within the cavernous left internal carotid artery without a significant stenosis through the ICA terminus. The right ICA terminus fills via a right posterior communicating artery. The right M1 artery also contribute. There is retrograde filling of the right A1 segment the anterior communicating artery. Left A1 and M1 segments are normally opacified. The right M1 segment is occluded. There is reconstitution of right MCA branch vessels at the bifurcation suggesting. Collateral flow. There is some attenuation of right MCA branch vessels compared to left. ACA branch vessels are intact bilaterally. No aneurysms. Posterior circulation: The right vertebral artery is slightly dominant to the left. PICA origins are visualized and normal. The vertebrobasilar junction is normal. The basilar artery is normal. The left posterior cerebral artery originates from basilar tip. Right posterior cerebral artery is of fetal type with a small right P1 segment now filling the artery. PCA branch vessels are within normal limits bilaterally.  Venous sinuses: The dural sinuses are patent. The left transverse sinus is dominant. The straight sinus and deep cerebral veins are intact. Cortical veins are unremarkable. Anatomic variants: Fetal type right posterior cerebral artery, now fed by a small right P1 segment. Delayed phase: Not performed in the emergency set. Review of the MIP images confirms the above findings IMPRESSION: 1. The right internal carotid artery is occluded at the carotid bifurcation. 2. The fetal type right posterior cerebral artery fills via a small right P1 segment to the right ICA terminus. 3. Right M1 occlusion with reconstitution of right MCA branch vessels beyond the bifurcation suggesting collateral flow. 4. Right A1 segment flow is likely retrograde. 5. No significant left-sided stenosis. 6. Aortic Atherosclerosis (ICD10-I70.0). No significant stenosis or aneurysm at the aortic arch. 7. Multilevel spondylosis of  the cervical spine. These results were called by telephone at the time of interpretation on 05/25/2017 at 11:58 Am to Dr. Amie Portland , who verbally acknowledged these results. Electronically Signed   By: San Morelle M.D.   On: 05/25/2017 12:07   Ct Cerebral Perfusion W Contrast  Result Date: 05/25/2017 CLINICAL DATA:  Focal neuro deficit, less than 6 hours, stroke suspected. Acute onset of left-sided weakness. Last seen well at 7 p.m. last evening. EXAM: CT PERFUSION BRAIN TECHNIQUE: Multiphase CT imaging of the brain was performed following IV bolus contrast injection. Subsequent parametric perfusion maps were calculated using RAPID software. CONTRAST:  46mL ISOVUE-370 IOPAMIDOL (ISOVUE-370) INJECTION 76% COMPARISON:  CT head and CTA head and neck from the same day. FINDINGS: CT Brain Perfusion Findings: CBF (<30%) Volume: 52mL Perfusion (Tmax>6.0s) volume: 116mL Mismatch Volume: 140mL Infarction Location:Right MCA territory IMPRESSION: 1. Large area of ischemic penumbra involving the right MCA territory with  estimated volume of 130 mL and small core infarct involving the basal ganglia. The estimated core volume is 8 mm. 2. The right MCA ischemia corresponds to right ICA and M1 occlusions. Electronically Signed   By: San Morelle M.D.   On: 05/25/2017 12:09   Ct Head Code Stroke Wo Contrast  Addendum Date: 05/25/2017   ADDENDUM REPORT: 05/25/2017 11:59 ADDENDUM: Hypodensity in the anterior limb right internal capsule and right insula suggestive of acute infarct. Updated aspects score 8 These results were called by telephone at the time of interpretation on 05/25/2017 at 11:58 am to Dr. Alanson Puls, who verbally acknowledged these results. Electronically Signed   By: Franchot Gallo M.D.   On: 05/25/2017 11:59   Result Date: 05/25/2017 CLINICAL DATA:  Code stroke.  Aphasia.  Right-sided gaze EXAM: CT HEAD WITHOUT CONTRAST TECHNIQUE: Contiguous axial images were obtained from the base of the skull through the vertex without intravenous contrast. COMPARISON:  MRI head 01/03/2011 FINDINGS: Brain: Mild atrophy. Chronic microvascular ischemic changes in the white matter. Negative for acute cortical infarct. Negative for hemorrhage or mass. Vascular: Hyperdense right MCA compatible with thrombus. This extends into the cavernous carotid. Skull: Negative Sinuses/Orbits: Mild mucosal edema left maxillary sinus. Bilateral cataract removal. Other: None ASPECTS ([place] Stroke Program Early CT Score) - Ganglionic level infarction (caudate, lentiform nuclei, internal capsule, insula, M1-M3 cortex): 7 - Supraganglionic infarction (M4-M6 cortex): 3 Total score (0-10 with 10 being normal): 10 IMPRESSION: 1. Hyperdense right MCA compatible with thrombosis. No acute infarct or hemorrhage 2. ASPECTS is 10 I have paged the stroke neurologist. Electronically Signed: By: Franchot Gallo M.D. On: 05/25/2017 11:35    Procedures Procedures (including critical care time)  Medications Ordered in ED Medications  iopamidol (ISOVUE-300)  61 % injection (not administered)  iopamidol (ISOVUE-300) 61 % injection (not administered)   stroke: mapping our early stages of recovery book (not administered)  0.9 %  sodium chloride infusion ( Intravenous New Bag/Given 05/25/17 1157)  acetaminophen (TYLENOL) tablet 650 mg (not administered)    Or  acetaminophen (TYLENOL) solution 650 mg (not administered)    Or  acetaminophen (TYLENOL) suppository 650 mg (not administered)  heparin injection 5,000 Units (not administered)  nitroGLYCERIN 100 MCG/ML intra-arterial injection (not administered)  eptifibatide (INTEGRILIN) 20 MG/10ML injection (not administered)  ceFAZolin (ANCEF) 2-4 GM/100ML-% IVPB (not administered)  iopamidol (ISOVUE-370) 76 % injection (90 mLs  Contrast Given 05/25/17 1130)     Initial Impression / Assessment and Plan / ED Course  I have reviewed the triage vital signs and the nursing notes.  Pertinent labs & imaging results that were available during my care of the patient were reviewed by me and considered in my medical decision making (see chart for details).    Pt d/w Dr. Rory Percy (neurology) who recommended IR.  She is not a candidate for tpa as time down is unknown.   He spoke with Dr. Estanislado Pandy who will do the procedure.  Pt went straight to IR and will be admitted to neurology service.  Final Clinical Impressions(s) / ED Diagnoses   Final diagnoses:  Cerebrovascular accident (CVA) due to thrombosis of right middle cerebral artery [facility])    ED Discharge Orders    None       Isla Pence, MD 05/25/17 1224

## 2017-05-25 NOTE — Consult Note (Signed)
PULMONARY / CRITICAL CARE MEDICINE   Name: Chloe Lopez MRN: [id number] DOB: [birth year]    ADMISSION DATE:  05/25/2017 CONSULTATION DATE: 05/25/2017   CHIEF COMPLAINT: Left hemiplegia  HISTORY OF PRESENT ILLNESS:        This is a 84 year old who was last known to be normal at 7 PM who was found by her daughter approximately 1030 this morning with a left hemiplegia.  She was brought to our department of emergency medicine where CT was remarkable only for a dense right MCA sign.  A CT angiogram showed complete occlusion of the right internal carotid.  The patient was intubated for deep sedation for interventional radiology after she was found to have a very large penumbra in excess of 130 cc on the perfusion scan.  The right internal carotid could not be recannulated.  She arrives in the intensive care unit intubated and mechanically ventilated.  Held her sedation and she is not at all interactive.  PAST MEDICAL HISTORY :  She  has a past medical history of Angioedema, Depression, Dysphagia, pharyngoesophageal phase (07/07/2012), Left hip pain (07/22/2011), Leg swelling (11/02/2012), Sciatica (08/21/2011), Sciatica (04/19/2013), Sweating (01/21/2012), and Urticaria.  PAST SURGICAL HISTORY: She  has a past surgical history that includes Mastectomy, partial (2009) and Abdominal hysterectomy.  Allergies  Allergen Reactions  . Azithromycin Swelling    Reportedly had reaction September 2017 - caused worsening angioedema. Unclear if it was this or the benzonatate she started at the same time.  . Benzonatate Swelling    Reportedly had reaction September 2017 - caused worsening angioedema. Unclear if it was this or the azithromycin she started at the same time.    No current facility-administered medications on file prior to encounter.    Current Outpatient Medications on File Prior to Encounter  Medication Sig  . CARTIA XT 180 MG 24 hr capsule TAKE ONE CAPSULE BY MOUTH DAILY  . doxepin (SINEQUAN) 10  MG capsule Take 3 capsules (30 mg total) by mouth at bedtime.  Marland Kitchen EPINEPHrine (EPIPEN 2-PAK) 0.3 mg/0.3 mL IJ SOAJ injection Inject 0.3 mg into the muscle once. Reported on 08/30/2015  . Multiple Vitamins-Minerals (ONE-A-DAY 50 PLUS PO) Take 1 tablet by mouth daily.   . predniSONE (DELTASONE) 10 MG tablet TAKE AS DIRECTED. ALTERNATE 10 MG ONE DAY THEN 5 MG THE NEXT.  Marland Kitchen predniSONE (DELTASONE) 5 MG tablet TAKE AS DIRECTED. ALTERNATE EVERY OTHER DAY WITH 10 MG.  . ranitidine (ZANTAC) 150 MG tablet Take one tablet twice a day. (Patient taking differently: Take 150 mg by mouth 2 (two) times daily. )  . traMADol (ULTRAM) 50 MG tablet Take 1 tablet (50 mg total) daily as needed by mouth (pain in legs).  . [DISCONTINUED] DOXEPIN HCL PO Take by mouth. 30 mg  Twice a day    FAMILY HISTORY:  Her indicated that her mother is deceased. She indicated that her father is deceased. She indicated that only one of her two sisters is alive. She indicated that her brother is deceased.   SOCIAL HISTORY: She  reports that she quit smoking about 21 years ago. she has never used smokeless tobacco. She reports that she drinks about 1.2 oz of alcohol per week. She reports that she does not use drugs.  She does have an advanced directive which her daughter will obtain.  REVIEW OF SYSTEMS:   At baseline the patient is active and performs her own ADLs she lives independently.  It sounds as though she had a TIA many  years ago but otherwise has had no significant CNS events.  The antidepressant she is taking is being used as a antihistamine due to her history of angioedema not as an antidepressant.  She has no baseline history of lung disease her only cardiac history is that of chronic atrial fibrillation she has no history of chest pain PND orthopnea.  She has had some slight lower extremity edema.  She has no significant GI history she has no history to suggest a bleeding risk specifically no history of recent I back or brain  surgery and no history of internal bleeding.  SUBJECTIVE:  As above  VITAL SIGNS: BP 134/88   Pulse 86   Temp 98.1 F (36.7 C) (Axillary)   Resp 14   Ht 5\' 5"  (1.651 m)   Wt 151 lb 14.4 oz (68.9 kg)   SpO2 100%   BMI 25.28 kg/m   HEMODYNAMICS:    VENTILATOR SETTINGS: Vent Mode: PRVC FiO2 (%):  [40 %] 40 % Set Rate:  [14 bmp] 14 bmp Vt Set:  [460 mL] 460 mL PEEP:  [5 cmH20] 5 cmH20 Plateau Pressure:  [14 cmH20] 14 cmH20  INTAKE / OUTPUT: No intake/output data recorded.  PHYSICAL EXAMINATION: General: He is orally intubated and mechanically ventilated and in no distress. Neuro: There is no response to voice.  In response to sternal rub she does not eye open, she does move the right upper extremity.  There is no movement on the left.  Pupils are equal. Cardiovascular: S1 and S2 are irregular regular without murmur rub or gallop.  There is no JVD there are no carotid bruits.  There is trace dependent edema symmetrically Lungs: There is symmetric air movement, some scattered rhonchi, no wheezes Abdomen: Abdomen is soft without any organomegaly masses tenderness guarding or rebound.  There is a dressing over the left femoral puncture site which is dry, there is no hematoma.   LABS:  BMET Recent Labs  Lab 05/25/17 1118 05/25/17 1124  NA 139 140  K 3.9 3.7  CL 102 101  CO2 23  --   BUN 11 12  CREATININE 0.74 0.60  GLUCOSE 118* 119*    Electrolytes Recent Labs  Lab 05/25/17 1118  CALCIUM 9.0    CBC Recent Labs  Lab 05/25/17 1118 05/25/17 1124  WBC 10.6*  --   HGB 15.7* 16.0*  HCT 46.6* 47.0*  PLT 276  --     Coag's Recent Labs  Lab 05/25/17 1118  APTT 27  INR 1.03    Sepsis Markers No results for input(s): LATICACIDVEN, PROCALCITON, O2SATVEN in the last 168 hours.  ABG No results for input(s): PHART, PCO2ART, PO2ART in the last 168 hours.  Liver Enzymes Recent Labs  Lab 05/25/17 1118  AST 20  ALT 11*  ALKPHOS 64  BILITOT 0.7   ALBUMIN 3.3*    Cardiac Enzymes No results for input(s): TROPONINI, PROBNP in the last 168 hours.  Glucose Recent Labs  Lab 05/25/17 1118  GLUCAP 109*    Imaging Ct Angio Head W Or Wo Contrast  Result Date: 05/25/2017 CLINICAL DATA:  Focal neuro deficit for left 6 hours. Stroke suspected. New onset of left-sided weakness, slurred speech, right-sided gaze. Symptoms began at 7 p.m. last evening. EXAM: CT ANGIOGRAPHY HEAD AND NECK TECHNIQUE: Multidetector CT imaging of the head and neck was performed using the standard protocol during bolus administration of intravenous contrast. Multiplanar CT image reconstructions and MIPs were obtained to evaluate the vascular anatomy. Carotid stenosis  measurements (when applicable) are obtained utilizing NASCET criteria, using the distal internal carotid diameter as the denominator. CONTRAST:  22mL ISOVUE-370 IOPAMIDOL (ISOVUE-370) INJECTION 76% COMPARISON:  CT head without contrast from the same day. MRI brain 01/03/2011 FINDINGS: CTA NECK FINDINGS Aortic arch: A 4 vessel aortic arch is present. The left vertebral artery originates directly from the aorta. Atherosclerotic calcifications are present at the aorta. There is no aneurysm. There is no focal stenosis of the great vessel origins. Right carotid system: The right common carotid artery is within normal limits. The right internal carotid artery is occluded at the bifurcation. There is no reconstitution in the neck. Left carotid system: The left common carotid artery is within normal limits. Bifurcation is unremarkable. The cervical left ICA demonstrates moderate tortuosity without a significant stenosis of greater than 50% relative to the more distal vessel. Vertebral arteries: The left vertebral artery originates from the aortic arch. The right vertebral artery is slightly dominant. It originates from the right subclavian artery without a significant stenosis. No significant focal stenosis or vascular  injury to either vertebral artery in the neck. Skeleton: Chronic endplate degenerative changes and loss of disc height are present at C4-5, C5-6, and C6-7. Foraminal narrowing is greatest on the left at C5-6. No focal lytic or blastic lesions are present. Other neck: Calcifications palatine tonsils are compatible with prior infection. No focal mucosal or submucosal lesions are present. Thyroid is heterogeneous without a dominant lesion. No significant adenopathy is present. Salivary glands are within normal limits. Upper chest: The lung apices are clear. Thoracic inlet is within normal limits. Review of the MIP images confirms the above findings CTA HEAD FINDINGS Anterior circulation: Atherosclerotic calcifications are present within the cavernous left internal carotid artery without a significant stenosis through the ICA terminus. The right ICA terminus fills via a right posterior communicating artery. The right M1 artery also contribute. There is retrograde filling of the right A1 segment the anterior communicating artery. Left A1 and M1 segments are normally opacified. The right M1 segment is occluded. There is reconstitution of right MCA branch vessels at the bifurcation suggesting. Collateral flow. There is some attenuation of right MCA branch vessels compared to left. ACA branch vessels are intact bilaterally. No aneurysms. Posterior circulation: The right vertebral artery is slightly dominant to the left. PICA origins are visualized and normal. The vertebrobasilar junction is normal. The basilar artery is normal. The left posterior cerebral artery originates from basilar tip. Right posterior cerebral artery is of fetal type with a small right P1 segment now filling the artery. PCA branch vessels are within normal limits bilaterally. Venous sinuses: The dural sinuses are patent. The left transverse sinus is dominant. The straight sinus and deep cerebral veins are intact. Cortical veins are unremarkable.  Anatomic variants: Fetal type right posterior cerebral artery, now fed by a small right P1 segment. Delayed phase: Not performed in the emergency set. Review of the MIP images confirms the above findings IMPRESSION: 1. The right internal carotid artery is occluded at the carotid bifurcation. 2. The fetal type right posterior cerebral artery fills via a small right P1 segment to the right ICA terminus. 3. Right M1 occlusion with reconstitution of right MCA branch vessels beyond the bifurcation suggesting collateral flow. 4. Right A1 segment flow is likely retrograde. 5. No significant left-sided stenosis. 6. Aortic Atherosclerosis (ICD10-I70.0). No significant stenosis or aneurysm at the aortic arch. 7. Multilevel spondylosis of the cervical spine. These results were called by telephone at the time of  interpretation on 05/25/2017 at 11:58 Am to Dr. Amie Portland , who verbally acknowledged these results. Electronically Signed   By: San Morelle M.D.   On: 05/25/2017 12:07   Ct Angio Neck W Or Wo Contrast  Result Date: 05/25/2017 CLINICAL DATA:  Focal neuro deficit for left 6 hours. Stroke suspected. New onset of left-sided weakness, slurred speech, right-sided gaze. Symptoms began at 7 p.m. last evening. EXAM: CT ANGIOGRAPHY HEAD AND NECK TECHNIQUE: Multidetector CT imaging of the head and neck was performed using the standard protocol during bolus administration of intravenous contrast. Multiplanar CT image reconstructions and MIPs were obtained to evaluate the vascular anatomy. Carotid stenosis measurements (when applicable) are obtained utilizing NASCET criteria, using the distal internal carotid diameter as the denominator. CONTRAST:  58mL ISOVUE-370 IOPAMIDOL (ISOVUE-370) INJECTION 76% COMPARISON:  CT head without contrast from the same day. MRI brain 01/03/2011 FINDINGS: CTA NECK FINDINGS Aortic arch: A 4 vessel aortic arch is present. The left vertebral artery originates directly from the aorta.  Atherosclerotic calcifications are present at the aorta. There is no aneurysm. There is no focal stenosis of the great vessel origins. Right carotid system: The right common carotid artery is within normal limits. The right internal carotid artery is occluded at the bifurcation. There is no reconstitution in the neck. Left carotid system: The left common carotid artery is within normal limits. Bifurcation is unremarkable. The cervical left ICA demonstrates moderate tortuosity without a significant stenosis of greater than 50% relative to the more distal vessel. Vertebral arteries: The left vertebral artery originates from the aortic arch. The right vertebral artery is slightly dominant. It originates from the right subclavian artery without a significant stenosis. No significant focal stenosis or vascular injury to either vertebral artery in the neck. Skeleton: Chronic endplate degenerative changes and loss of disc height are present at C4-5, C5-6, and C6-7. Foraminal narrowing is greatest on the left at C5-6. No focal lytic or blastic lesions are present. Other neck: Calcifications palatine tonsils are compatible with prior infection. No focal mucosal or submucosal lesions are present. Thyroid is heterogeneous without a dominant lesion. No significant adenopathy is present. Salivary glands are within normal limits. Upper chest: The lung apices are clear. Thoracic inlet is within normal limits. Review of the MIP images confirms the above findings CTA HEAD FINDINGS Anterior circulation: Atherosclerotic calcifications are present within the cavernous left internal carotid artery without a significant stenosis through the ICA terminus. The right ICA terminus fills via a right posterior communicating artery. The right M1 artery also contribute. There is retrograde filling of the right A1 segment the anterior communicating artery. Left A1 and M1 segments are normally opacified. The right M1 segment is occluded. There is  reconstitution of right MCA branch vessels at the bifurcation suggesting. Collateral flow. There is some attenuation of right MCA branch vessels compared to left. ACA branch vessels are intact bilaterally. No aneurysms. Posterior circulation: The right vertebral artery is slightly dominant to the left. PICA origins are visualized and normal. The vertebrobasilar junction is normal. The basilar artery is normal. The left posterior cerebral artery originates from basilar tip. Right posterior cerebral artery is of fetal type with a small right P1 segment now filling the artery. PCA branch vessels are within normal limits bilaterally. Venous sinuses: The dural sinuses are patent. The left transverse sinus is dominant. The straight sinus and deep cerebral veins are intact. Cortical veins are unremarkable. Anatomic variants: Fetal type right posterior cerebral artery, now fed by a small  right P1 segment. Delayed phase: Not performed in the emergency set. Review of the MIP images confirms the above findings IMPRESSION: 1. The right internal carotid artery is occluded at the carotid bifurcation. 2. The fetal type right posterior cerebral artery fills via a small right P1 segment to the right ICA terminus. 3. Right M1 occlusion with reconstitution of right MCA branch vessels beyond the bifurcation suggesting collateral flow. 4. Right A1 segment flow is likely retrograde. 5. No significant left-sided stenosis. 6. Aortic Atherosclerosis (ICD10-I70.0). No significant stenosis or aneurysm at the aortic arch. 7. Multilevel spondylosis of the cervical spine. These results were called by telephone at the time of interpretation on 05/25/2017 at 11:58 Am to Dr. Amie Portland , who verbally acknowledged these results. Electronically Signed   By: San Morelle M.D.   On: 05/25/2017 12:07   Ct Cerebral Perfusion W Contrast  Result Date: 05/25/2017 CLINICAL DATA:  Focal neuro deficit, less than 6 hours, stroke suspected. Acute  onset of left-sided weakness. Last seen well at 7 p.m. last evening. EXAM: CT PERFUSION BRAIN TECHNIQUE: Multiphase CT imaging of the brain was performed following IV bolus contrast injection. Subsequent parametric perfusion maps were calculated using RAPID software. CONTRAST:  57mL ISOVUE-370 IOPAMIDOL (ISOVUE-370) INJECTION 76% COMPARISON:  CT head and CTA head and neck from the same day. FINDINGS: CT Brain Perfusion Findings: CBF (<30%) Volume: 17mL Perfusion (Tmax>6.0s) volume: 156mL Mismatch Volume: 161mL Infarction Location:Right MCA territory IMPRESSION: 1. Large area of ischemic penumbra involving the right MCA territory with estimated volume of 130 mL and small core infarct involving the basal ganglia. The estimated core volume is 8 mm. 2. The right MCA ischemia corresponds to right ICA and M1 occlusions. Electronically Signed   By: San Morelle M.D.   On: 05/25/2017 12:09   Dg Chest Port 1 View  Result Date: 05/25/2017 CLINICAL DATA:  Intubation, stroke EXAM: PORTABLE CHEST 1 VIEW COMPARISON:  Portable exam 1546 hours compared to 08/25/2016 FINDINGS: Tip of endotracheal tube projects 4.4 cm above carina. Nasogastric tube extends into stomach. Normal heart size, mediastinal contours, and pulmonary vascularity. Atherosclerotic calcifications aorta. Chronic bronchitic changes with mild RIGHT basilar atelectasis. No acute infiltrate, pleural effusion or pneumothorax. Bones demineralized with chronic RIGHT rotator cuff tear and a sclerotic lesion at the proximal LEFT humerus unchanged favor enchondroma less likely sequela of bone infarct. IMPRESSION: Satisfactory endotracheal tube position. Chronic bronchitic changes with minimal LEFT basilar atelectasis. Electronically Signed   By: Lavonia Dana M.D.   On: 05/25/2017 16:13   Ct Head Code Stroke Wo Contrast  Addendum Date: 05/25/2017   ADDENDUM REPORT: 05/25/2017 11:59 ADDENDUM: Hypodensity in the anterior limb right internal capsule and right  insula suggestive of acute infarct. Updated aspects score 8 These results were called by telephone at the time of interpretation on 05/25/2017 at 11:58 am to Dr. Alanson Puls, who verbally acknowledged these results. Electronically Signed   By: Franchot Gallo M.D.   On: 05/25/2017 11:59   Result Date: 05/25/2017 CLINICAL DATA:  Code stroke.  Aphasia.  Right-sided gaze EXAM: CT HEAD WITHOUT CONTRAST TECHNIQUE: Contiguous axial images were obtained from the base of the skull through the vertex without intravenous contrast. COMPARISON:  MRI head 01/03/2011 FINDINGS: Brain: Mild atrophy. Chronic microvascular ischemic changes in the white matter. Negative for acute cortical infarct. Negative for hemorrhage or mass. Vascular: Hyperdense right MCA compatible with thrombus. This extends into the cavernous carotid. Skull: Negative Sinuses/Orbits: Mild mucosal edema left maxillary sinus. Bilateral cataract removal. Other: None  ASPECTS [facility] Stroke Program Early CT Score) - Ganglionic level infarction (caudate, lentiform nuclei, internal capsule, insula, M1-M3 cortex): 7 - Supraganglionic infarction (M4-M6 cortex): 3 Total score (0-10 with 10 being normal): 10 IMPRESSION: 1. Hyperdense right MCA compatible with thrombosis. No acute infarct or hemorrhage 2. ASPECTS is 10 I have paged the stroke neurologist. Electronically Signed: By: Franchot Gallo M.D. On: 05/25/2017 11:35    DISCUCUSSION:      This is a 84 year old with a history of chronic atrial fibrillation not on anticoagulation who presents with a total right internal carotid occlusion.  The occlusion could not be opened, and she is transferred to the intensive care unit intubated and mechanically ventilated.  ASSESSMENT / PLAN:  PULMONARY A: Currently she is intubated solely for airway protection and to allow administration of deep sedation for interventional radiology.  I reviewed her chest x-ray and I am not impressed by an infiltrate.  I have attempted to  transfer work of breathing to the patient how other she is not yet sufficiently awake to do so. CARDIOVASCULAR A: She does have chronic atrial fibrillation which is currently rate controlled on no agent.  I will order a as needed Cardizem infusion for rate control.  We are going to intentionally run her blood pressure is somewhat higher hoping to collateralize from left to right as she had a very large area of marginally perfused brain on the perfusion study.    GASTROINTESTINAL A: Prophylaxis will be with Protonix   NEUROLOGIC A: She has a complete right carotid occlusion and I suspect is going to evolve a large right brain infarct.  I have discussed this with family and asked them to anticipate substantial neurological deficits and to consider what the patient would want in the event that that occurs.   Greater than 32 minutes was spent in the care of this patient today  Lars Masson, MD Pulmonary and [place] Pager: [phone]  05/25/2017, 4:43 PM

## 2017-05-25 NOTE — Sedation Documentation (Signed)
Sheath to right groin removed. V-Pad applied 

## 2017-05-25 NOTE — ED Triage Notes (Signed)
Per [facility] patient coming from home. LSN 7 pm last night. Patient presenting with aloc, left sided weakness, slurred speech, and right sided gaze. Patient only oriented to self on arrival.

## 2017-05-25 NOTE — Anesthesia Preprocedure Evaluation (Signed)
Anesthesia Evaluation  Patient identified by MRN, date of birth, ID band Patient confused    Reviewed: Unable to perform ROS - Chart review onlyPreop documentation limited or incomplete due to emergent nature of procedure.  Airway Mallampati: II  TM Distance: <3 FB Neck ROM: Full    Dental  (+) Teeth Intact   Pulmonary former smoker,           Cardiovascular + Peripheral Vascular Disease  + dysrhythmias Atrial Fibrillation  Rhythm:Irregular     Neuro/Psych PSYCHIATRIC DISORDERS Depression CVA, Residual Symptoms    GI/Hepatic Neg liver ROS, GERD  Medicated,  Endo/Other  negative endocrine ROS  Renal/GU negative Renal ROS     Musculoskeletal   Abdominal   Peds  Hematology negative hematology ROS (+)   Anesthesia Other Findings Report of recent angioedema on steroids  Reproductive/Obstetrics                             Anesthesia Physical Anesthesia Plan  ASA: IV and emergent  Anesthesia Plan: General   Post-op Pain Management:    Induction: Intravenous  PONV Risk Score and Plan: 3 and Treatment may vary due to age or medical condition  Airway Management Planned: Oral ETT  Additional Equipment: Arterial line  Intra-op Plan:   Post-operative Plan: Post-operative intubation/ventilation  Informed Consent: I have reviewed the patients History and Physical, chart, labs and discussed the procedure including the risks, benefits and alternatives for the proposed anesthesia with the patient or authorized representative who has indicated his/her understanding and acceptance.   Dental advisory given  Plan Discussed with: CRNA and Surgeon  Anesthesia Plan Comments:         Anesthesia Quick Evaluation

## 2017-05-26 ENCOUNTER — Encounter (HOSPITAL_COMMUNITY): Payer: Self-pay | Admitting: Interventional Radiology

## 2017-05-26 ENCOUNTER — Inpatient Hospital Stay (HOSPITAL_COMMUNITY): Payer: Medicare Other

## 2017-05-26 DIAGNOSIS — I4891 Unspecified atrial fibrillation: Secondary | ICD-10-CM

## 2017-05-26 LAB — BASIC METABOLIC PANEL
Anion gap: 10 (ref 5–15)
BUN: 7 mg/dL (ref 6–20)
CO2: 19 mmol/L — ABNORMAL LOW (ref 22–32)
Calcium: 8.1 mg/dL — ABNORMAL LOW (ref 8.9–10.3)
Chloride: 109 mmol/L (ref 101–111)
Creatinine, Ser: 0.6 mg/dL (ref 0.44–1.00)
Glucose, Bld: 110 mg/dL — ABNORMAL HIGH (ref 65–99)
POTASSIUM: 2.9 mmol/L — AB (ref 3.5–5.1)
SODIUM: 138 mmol/L (ref 135–145)

## 2017-05-26 LAB — HEMOGLOBIN A1C
HEMOGLOBIN A1C: 5.5 % (ref 4.8–5.6)
MEAN PLASMA GLUCOSE: 111.15 mg/dL

## 2017-05-26 LAB — LIPID PANEL
CHOLESTEROL: 141 mg/dL (ref 0–200)
HDL: 55 mg/dL (ref >40)
LDL Cholesterol: 74 mg/dL (ref 0–99)
Total CHOL/HDL Ratio: 2.6 RATIO
Triglycerides: 62 mg/dL (ref <150)
VLDL: 12 mg/dL (ref 0–40)

## 2017-05-26 MED ORDER — POTASSIUM CHLORIDE 10 MEQ/100ML IV SOLN
10.0000 meq | INTRAVENOUS | Status: AC
  Administered 2017-05-26 (×6): 10 meq via INTRAVENOUS
  Filled 2017-05-26 (×6): qty 100

## 2017-05-26 MED ORDER — DOXEPIN HCL 10 MG PO CAPS
30.0000 mg | ORAL_CAPSULE | Freq: Every day | ORAL | Status: DC
  Administered 2017-05-27 – 2017-06-02 (×5): 30 mg via ORAL
  Filled 2017-05-26 (×9): qty 3

## 2017-05-26 MED ORDER — PREDNISONE 5 MG PO TABS
10.0000 mg | ORAL_TABLET | ORAL | Status: DC
  Administered 2017-05-29 – 2017-06-02 (×3): 10 mg via ORAL
  Filled 2017-05-26: qty 1
  Filled 2017-05-26 (×4): qty 2
  Filled 2017-05-26: qty 1

## 2017-05-26 MED ORDER — METOPROLOL TARTRATE 5 MG/5ML IV SOLN
2.5000 mg | Freq: Four times a day (QID) | INTRAVENOUS | Status: DC
  Administered 2017-05-26 – 2017-05-27 (×4): 2.5 mg via INTRAVENOUS
  Filled 2017-05-26 (×5): qty 5

## 2017-05-26 MED ORDER — FAMOTIDINE IN NACL 20-0.9 MG/50ML-% IV SOLN
20.0000 mg | INTRAVENOUS | Status: DC
  Administered 2017-05-26 – 2017-05-28 (×3): 20 mg via INTRAVENOUS
  Filled 2017-05-26 (×3): qty 50

## 2017-05-26 MED ORDER — SODIUM CHLORIDE 0.9 % IV SOLN
INTRAVENOUS | Status: DC
  Administered 2017-05-26 – 2017-06-01 (×5): via INTRAVENOUS

## 2017-05-26 MED ORDER — ATORVASTATIN CALCIUM 10 MG PO TABS
10.0000 mg | ORAL_TABLET | Freq: Every day | ORAL | Status: DC
  Administered 2017-05-27 – 2017-06-02 (×6): 10 mg via ORAL
  Filled 2017-05-26 (×8): qty 1

## 2017-05-26 MED ORDER — DILTIAZEM HCL 30 MG PO TABS
30.0000 mg | ORAL_TABLET | Freq: Four times a day (QID) | ORAL | Status: DC
  Administered 2017-05-26 – 2017-06-03 (×28): 30 mg via ORAL
  Filled 2017-05-26 (×29): qty 1

## 2017-05-26 MED ORDER — PREDNISONE 5 MG PO TABS
5.0000 mg | ORAL_TABLET | ORAL | Status: DC
  Administered 2017-05-26 – 2017-06-03 (×5): 5 mg via ORAL
  Filled 2017-05-26 (×6): qty 1

## 2017-05-26 NOTE — Progress Notes (Signed)
PULMONARY / CRITICAL CARE MEDICINE   Name: Chloe Lopez MRN: [id number] DOB: 06/25/1923    ADMISSION DATE:  05/25/2017   CHIEF COMPLAINT:  Left hemiplegia  HISTORY OF PRESENT ILLNESS:        This is a 84 year old who was last known to be normal at 7 PM who was found by her daughter approximately 1030 the next morning with a left hemiplegia.  She was brought to our department of emergency medicine where CT was remarkable only for a dense right MCA sign.  A CT angiogram showed complete occlusion of the right internal carotid.  The patient was intubated for deep sedation for interventional radiology after she was found to have a very large penumbra in excess of 130 cc on the perfusion scan.  The right internal carotid could not be recannulated.  She arrived in the intensive care unit intubated on 1/22.  We were unable to extubate the patient due to excessive sedation.   Overnight she has reportedly been intermittently interacting.  She is on a modest dose of Neo-Synephrine for blood pressure support.  I decreased propofol to 20 mcg/kg/min for my exam.   PAST MEDICAL HISTORY :  She  has a past medical history of Angioedema, Depression, Dysphagia, pharyngoesophageal phase (07/07/2012), Left hip pain (07/22/2011), Leg swelling (11/02/2012), Sciatica (08/21/2011), Sciatica (04/19/2013), Sweating (01/21/2012), and Urticaria.  PAST SURGICAL HISTORY: She  has a past surgical history that includes Mastectomy, partial (2009) and Abdominal hysterectomy.  Allergies  Allergen Reactions  . Azithromycin Swelling    Reportedly had reaction September 2017 - caused worsening angioedema. Unclear if it was this or the benzonatate she started at the same time.  . Benzonatate Swelling    Reportedly had reaction September 2017 - caused worsening angioedema. Unclear if it was this or the azithromycin she started at the same time.    No current facility-administered medications on file prior to encounter.    Current  Outpatient Medications on File Prior to Encounter  Medication Sig  . CARTIA XT 180 MG 24 hr capsule TAKE ONE CAPSULE BY MOUTH DAILY  . doxepin (SINEQUAN) 10 MG capsule Take 3 capsules (30 mg total) by mouth at bedtime. (Patient taking differently: Take 20-30 mg by mouth at bedtime. )  . EPINEPHrine (EPIPEN 2-PAK) 0.3 mg/0.3 mL IJ SOAJ injection Inject 0.3 mg into the muscle once. Reported on 08/30/2015  . predniSONE (DELTASONE) 10 MG tablet TAKE AS DIRECTED. ALTERNATE 10 MG ONE DAY THEN 5 MG THE NEXT. (Patient taking differently: Take 5-10 mg by mouth See admin instructions. Alternate every over day with 5mg  and 10mg .)  . ranitidine (ZANTAC) 150 MG tablet Take one tablet twice a day. (Patient taking differently: Take 150 mg by mouth daily as needed for heartburn. )  . traMADol (ULTRAM) 50 MG tablet Take 1 tablet (50 mg total) daily as needed by mouth (pain in legs).  . [DISCONTINUED] DOXEPIN HCL PO Take by mouth. 30 mg  Twice a day    FAMILY HISTORY:  Her indicated that her mother is deceased. She indicated that her father is deceased. She indicated that only one of her two sisters is alive. She indicated that her brother is deceased.   SOCIAL HISTORY: She  reports that she quit smoking about 21 years ago. she has never used smokeless tobacco. She reports that she drinks about 1.2 oz of alcohol per week. She reports that she does not use drugs.  REVIEW OF SYSTEMS:   Not obtainable  SUBJECTIVE:  As above  VITAL SIGNS: BP (!) 148/74   Pulse 84   Temp 98.4 F (36.9 C) (Axillary)   Resp 14   Ht 5\' 5"  (1.651 m)   Wt 151 lb 14.4 oz (68.9 kg)   SpO2 98%   BMI 25.28 kg/m   HEMODYNAMICS:    VENTILATOR SETTINGS: Vent Mode: PRVC FiO2 (%):  [30 %-40 %] 30 % Set Rate:  [14 bmp] 14 bmp Vt Set:  [460 mL] 460 mL PEEP:  [5 cmH20] 5 cmH20 Plateau Pressure:  [14 cmH20-15 cmH20] 15 cmH20  INTAKE / OUTPUT: I/O last 3 completed shifts: In: 3389.8 [I.V.:3339.8; IV Piggyback:50] Out: 3125  [Urine:3025; Blood:100]  PHYSICAL EXAMINATION: General: Elderly white female orally intubated and mechanically ventilated.  In no distress. Neuro: My exam is on 10 mcg of propofol.  She does not respond to voice for me, she does not eye open to noxious stimuli.  She is purposeful with the right upper extremity, she does not move the left upper extremity she moves the right lower extremity vigorously in the lobe left lower extremity to a less vigorous extent.  Pupils are equal.   Cardiovascular: S1 and S2 are irregularly irregular  without murmur rub or gallop Lungs: After decreasing the patient to pressure support of 7 she is unlabored, there is symmetric air movement and no wheezes. Abdomen: The abdomen is soft without any organomegaly masses or tenderness. Musculoskeletal: There is trace lower extremity edema.  LABS:  BMET Recent Labs  Lab 05/25/17 1118 05/25/17 1124  NA 139 140  K 3.9 3.7  CL 102 101  CO2 23  --   BUN 11 12  CREATININE 0.74 0.60  GLUCOSE 118* 119*    Electrolytes Recent Labs  Lab 05/25/17 1118  CALCIUM 9.0    CBC Recent Labs  Lab 05/25/17 1118 05/25/17 1124  WBC 10.6*  --   HGB 15.7* 16.0*  HCT 46.6* 47.0*  PLT 276  --     Coag's Recent Labs  Lab 05/25/17 1118  APTT 27  INR 1.03    Sepsis Markers No results for input(s): LATICACIDVEN, PROCALCITON, O2SATVEN in the last 168 hours.  ABG Recent Labs  Lab 05/25/17 1654  PHART 7.366  PCO2ART 38.5  PO2ART 145.0*    Liver Enzymes Recent Labs  Lab 05/25/17 1118  AST 20  ALT 11*  ALKPHOS 64  BILITOT 0.7  ALBUMIN 3.3*    Cardiac Enzymes No results for input(s): TROPONINI, PROBNP in the last 168 hours.  Glucose Recent Labs  Lab 05/25/17 1118  GLUCAP 109*    Imaging Ct Angio Head W Or Wo Contrast  Result Date: 05/25/2017 CLINICAL DATA:  Focal neuro deficit for left 6 hours. Stroke suspected. New onset of left-sided weakness, slurred speech, right-sided gaze. Symptoms began  at 7 p.m. last evening. EXAM: CT ANGIOGRAPHY HEAD AND NECK TECHNIQUE: Multidetector CT imaging of the head and neck was performed using the standard protocol during bolus administration of intravenous contrast. Multiplanar CT image reconstructions and MIPs were obtained to evaluate the vascular anatomy. Carotid stenosis measurements (when applicable) are obtained utilizing NASCET criteria, using the distal internal carotid diameter as the denominator. CONTRAST:  40mL ISOVUE-370 IOPAMIDOL (ISOVUE-370) INJECTION 76% COMPARISON:  CT head without contrast from the same day. MRI brain 01/03/2011 FINDINGS: CTA NECK FINDINGS Aortic arch: A 4 vessel aortic arch is present. The left vertebral artery originates directly from the aorta. Atherosclerotic calcifications are present at the aorta. There is no aneurysm. There is no  focal stenosis of the great vessel origins. Right carotid system: The right common carotid artery is within normal limits. The right internal carotid artery is occluded at the bifurcation. There is no reconstitution in the neck. Left carotid system: The left common carotid artery is within normal limits. Bifurcation is unremarkable. The cervical left ICA demonstrates moderate tortuosity without a significant stenosis of greater than 50% relative to the more distal vessel. Vertebral arteries: The left vertebral artery originates from the aortic arch. The right vertebral artery is slightly dominant. It originates from the right subclavian artery without a significant stenosis. No significant focal stenosis or vascular injury to either vertebral artery in the neck. Skeleton: Chronic endplate degenerative changes and loss of disc height are present at C4-5, C5-6, and C6-7. Foraminal narrowing is greatest on the left at C5-6. No focal lytic or blastic lesions are present. Other neck: Calcifications palatine tonsils are compatible with prior infection. No focal mucosal or submucosal lesions are present. Thyroid  is heterogeneous without a dominant lesion. No significant adenopathy is present. Salivary glands are within normal limits. Upper chest: The lung apices are clear. Thoracic inlet is within normal limits. Review of the MIP images confirms the above findings CTA HEAD FINDINGS Anterior circulation: Atherosclerotic calcifications are present within the cavernous left internal carotid artery without a significant stenosis through the ICA terminus. The right ICA terminus fills via a right posterior communicating artery. The right M1 artery also contribute. There is retrograde filling of the right A1 segment the anterior communicating artery. Left A1 and M1 segments are normally opacified. The right M1 segment is occluded. There is reconstitution of right MCA branch vessels at the bifurcation suggesting. Collateral flow. There is some attenuation of right MCA branch vessels compared to left. ACA branch vessels are intact bilaterally. No aneurysms. Posterior circulation: The right vertebral artery is slightly dominant to the left. PICA origins are visualized and normal. The vertebrobasilar junction is normal. The basilar artery is normal. The left posterior cerebral artery originates from basilar tip. Right posterior cerebral artery is of fetal type with a small right P1 segment now filling the artery. PCA branch vessels are within normal limits bilaterally. Venous sinuses: The dural sinuses are patent. The left transverse sinus is dominant. The straight sinus and deep cerebral veins are intact. Cortical veins are unremarkable. Anatomic variants: Fetal type right posterior cerebral artery, now fed by a small right P1 segment. Delayed phase: Not performed in the emergency set. Review of the MIP images confirms the above findings IMPRESSION: 1. The right internal carotid artery is occluded at the carotid bifurcation. 2. The fetal type right posterior cerebral artery fills via a small right P1 segment to the right ICA  terminus. 3. Right M1 occlusion with reconstitution of right MCA branch vessels beyond the bifurcation suggesting collateral flow. 4. Right A1 segment flow is likely retrograde. 5. No significant left-sided stenosis. 6. Aortic Atherosclerosis (ICD10-I70.0). No significant stenosis or aneurysm at the aortic arch. 7. Multilevel spondylosis of the cervical spine. These results were called by telephone at the time of interpretation on 05/25/2017 at 11:58 Am to Dr. Amie Portland , who verbally acknowledged these results. Electronically Signed   By: San Morelle M.D.   On: 05/25/2017 12:07   Ct Angio Neck W Or Wo Contrast  Result Date: 05/25/2017 CLINICAL DATA:  Focal neuro deficit for left 6 hours. Stroke suspected. New onset of left-sided weakness, slurred speech, right-sided gaze. Symptoms began at 7 p.m. last evening. EXAM: CT ANGIOGRAPHY  HEAD AND NECK TECHNIQUE: Multidetector CT imaging of the head and neck was performed using the standard protocol during bolus administration of intravenous contrast. Multiplanar CT image reconstructions and MIPs were obtained to evaluate the vascular anatomy. Carotid stenosis measurements (when applicable) are obtained utilizing NASCET criteria, using the distal internal carotid diameter as the denominator. CONTRAST:  71mL ISOVUE-370 IOPAMIDOL (ISOVUE-370) INJECTION 76% COMPARISON:  CT head without contrast from the same day. MRI brain 01/03/2011 FINDINGS: CTA NECK FINDINGS Aortic arch: A 4 vessel aortic arch is present. The left vertebral artery originates directly from the aorta. Atherosclerotic calcifications are present at the aorta. There is no aneurysm. There is no focal stenosis of the great vessel origins. Right carotid system: The right common carotid artery is within normal limits. The right internal carotid artery is occluded at the bifurcation. There is no reconstitution in the neck. Left carotid system: The left common carotid artery is within normal limits.  Bifurcation is unremarkable. The cervical left ICA demonstrates moderate tortuosity without a significant stenosis of greater than 50% relative to the more distal vessel. Vertebral arteries: The left vertebral artery originates from the aortic arch. The right vertebral artery is slightly dominant. It originates from the right subclavian artery without a significant stenosis. No significant focal stenosis or vascular injury to either vertebral artery in the neck. Skeleton: Chronic endplate degenerative changes and loss of disc height are present at C4-5, C5-6, and C6-7. Foraminal narrowing is greatest on the left at C5-6. No focal lytic or blastic lesions are present. Other neck: Calcifications palatine tonsils are compatible with prior infection. No focal mucosal or submucosal lesions are present. Thyroid is heterogeneous without a dominant lesion. No significant adenopathy is present. Salivary glands are within normal limits. Upper chest: The lung apices are clear. Thoracic inlet is within normal limits. Review of the MIP images confirms the above findings CTA HEAD FINDINGS Anterior circulation: Atherosclerotic calcifications are present within the cavernous left internal carotid artery without a significant stenosis through the ICA terminus. The right ICA terminus fills via a right posterior communicating artery. The right M1 artery also contribute. There is retrograde filling of the right A1 segment the anterior communicating artery. Left A1 and M1 segments are normally opacified. The right M1 segment is occluded. There is reconstitution of right MCA branch vessels at the bifurcation suggesting. Collateral flow. There is some attenuation of right MCA branch vessels compared to left. ACA branch vessels are intact bilaterally. No aneurysms. Posterior circulation: The right vertebral artery is slightly dominant to the left. PICA origins are visualized and normal. The vertebrobasilar junction is normal. The basilar  artery is normal. The left posterior cerebral artery originates from basilar tip. Right posterior cerebral artery is of fetal type with a small right P1 segment now filling the artery. PCA branch vessels are within normal limits bilaterally. Venous sinuses: The dural sinuses are patent. The left transverse sinus is dominant. The straight sinus and deep cerebral veins are intact. Cortical veins are unremarkable. Anatomic variants: Fetal type right posterior cerebral artery, now fed by a small right P1 segment. Delayed phase: Not performed in the emergency set. Review of the MIP images confirms the above findings IMPRESSION: 1. The right internal carotid artery is occluded at the carotid bifurcation. 2. The fetal type right posterior cerebral artery fills via a small right P1 segment to the right ICA terminus. 3. Right M1 occlusion with reconstitution of right MCA branch vessels beyond the bifurcation suggesting collateral flow. 4. Right A1 segment  flow is likely retrograde. 5. No significant left-sided stenosis. 6. Aortic Atherosclerosis (ICD10-I70.0). No significant stenosis or aneurysm at the aortic arch. 7. Multilevel spondylosis of the cervical spine. These results were called by telephone at the time of interpretation on 05/25/2017 at 11:58 Am to Dr. Amie Portland , who verbally acknowledged these results. Electronically Signed   By: San Morelle M.D.   On: 05/25/2017 12:07   Mr Brain Wo Contrast  Result Date: 05/25/2017 CLINICAL DATA:  Initial evaluation for acute left-sided weakness, found have right ICA occlusion with right MCA embolism, status post attempted catheter directed revascularization. EXAM: MRI HEAD WITHOUT CONTRAST TECHNIQUE: Multiplanar, multiecho pulse sequences of the brain and surrounding structures were obtained without intravenous contrast. COMPARISON:  Comparison made with prior CTs from earlier the same day. FINDINGS: Brain: Generalized age-related cerebral atrophy. Patchy and  confluent T2/FLAIR hyperintensity within the periventricular and deep white matter both cerebral hemispheres, most consistent with chronic small vessel ischemic disease, mild to moderate nature. Abnormal restricted diffusion involving the right caudate and lentiform nucleus, extending into the right subinsular white matter, consistent with acute ischemic infarct. Area of infarction measures 4.8 x 2.3 x 5.2 cm. Localized edema without significant mass effect. No associated hemorrhage. Additional patchy subcentimeter cortical and subcortical infarcts seen within the overlying right cerebral hemisphere with involvement of the right frontal parietal, and occipital lobes. No associated edema or mass effect. Single subcentimeter nonhemorrhagic punctate cortical infarct noted within the left parietal lobe as well (series 3, image 40). No other evidence for acute or subacute ischemia. Gray-white matter differentiation otherwise maintained. No mass lesion, midline shift or mass effect. No hydrocephalus. No extra-axial fluid collection. Pituitary gland suprasellar region normal. Midline structures intact and normal. Vascular: Abnormal flow void within the right ICA to the level of the terminus, consistent with known occlusion. Right M1 segment also appears occluded. Normal intravascular flow voids seen within the remainder of the major intracranial arterial vasculature at the skull base. Skull and upper cervical spine: Craniocervical junction normal. Upper cervical spine normal. Bone marrow signal intensity normal. No scalp soft tissue abnormality. Sinuses/Orbits: Globes oral soft tissues within normal limits. Patient status post cataract extraction bilaterally. Air-fluid level noted within the left maxillary sinus. Layering fluid noted within the nasopharynx. Patient likely intubated. Trace opacity noted within the right mastoid air cells. Inner ear structures normal. Other: None. IMPRESSION: 1. Acute ischemic right MCA  territory infarct involving the right basal ganglia, with additional scattered subcentimeter cortical subcortical infarcts within the overlying right cerebral hemisphere. No associated hemorrhage or mass effect. 2. Single punctate nonhemorrhagic left parietal cortical infarct as above. 3. Abnormal flow voids within the right ICA and M1 segment, consistent with previously identified occlusion. 4. Age-related atrophy with mild to moderate chronic small vessel ischemic disease. Electronically Signed   By: Jeannine Boga M.D.   On: 05/25/2017 23:53   Ct Cerebral Perfusion W Contrast  Result Date: 05/25/2017 CLINICAL DATA:  Focal neuro deficit, less than 6 hours, stroke suspected. Acute onset of left-sided weakness. Last seen well at 7 p.m. last evening. EXAM: CT PERFUSION BRAIN TECHNIQUE: Multiphase CT imaging of the brain was performed following IV bolus contrast injection. Subsequent parametric perfusion maps were calculated using RAPID software. CONTRAST:  13mL ISOVUE-370 IOPAMIDOL (ISOVUE-370) INJECTION 76% COMPARISON:  CT head and CTA head and neck from the same day. FINDINGS: CT Brain Perfusion Findings: CBF (<30%) Volume: 86mL Perfusion (Tmax>6.0s) volume: 190mL Mismatch Volume: 167mL Infarction Location:Right MCA territory IMPRESSION: 1. Large area of  ischemic penumbra involving the right MCA territory with estimated volume of 130 mL and small core infarct involving the basal ganglia. The estimated core volume is 8 mm. 2. The right MCA ischemia corresponds to right ICA and M1 occlusions. Electronically Signed   By: San Morelle M.D.   On: 05/25/2017 12:09   Dg Chest Port 1 View  Result Date: 05/25/2017 CLINICAL DATA:  Intubation, stroke EXAM: PORTABLE CHEST 1 VIEW COMPARISON:  Portable exam 1546 hours compared to 08/25/2016 FINDINGS: Tip of endotracheal tube projects 4.4 cm above carina. Nasogastric tube extends into stomach. Normal heart size, mediastinal contours, and pulmonary  vascularity. Atherosclerotic calcifications aorta. Chronic bronchitic changes with mild RIGHT basilar atelectasis. No acute infiltrate, pleural effusion or pneumothorax. Bones demineralized with chronic RIGHT rotator cuff tear and a sclerotic lesion at the proximal LEFT humerus unchanged favor enchondroma less likely sequela of bone infarct. IMPRESSION: Satisfactory endotracheal tube position. Chronic bronchitic changes with minimal LEFT basilar atelectasis. Electronically Signed   By: Lavonia Dana M.D.   On: 05/25/2017 16:13   Ct Head Code Stroke Wo Contrast  Addendum Date: 05/25/2017   ADDENDUM REPORT: 05/25/2017 11:59 ADDENDUM: Hypodensity in the anterior limb right internal capsule and right insula suggestive of acute infarct. Updated aspects score 8 These results were called by telephone at the time of interpretation on 05/25/2017 at 11:58 am to Dr. Alanson Puls, who verbally acknowledged these results. Electronically Signed   By: Franchot Gallo M.D.   On: 05/25/2017 11:59   Result Date: 05/25/2017 CLINICAL DATA:  Code stroke.  Aphasia.  Right-sided gaze EXAM: CT HEAD WITHOUT CONTRAST TECHNIQUE: Contiguous axial images were obtained from the base of the skull through the vertex without intravenous contrast. COMPARISON:  MRI head 01/03/2011 FINDINGS: Brain: Mild atrophy. Chronic microvascular ischemic changes in the white matter. Negative for acute cortical infarct. Negative for hemorrhage or mass. Vascular: Hyperdense right MCA compatible with thrombus. This extends into the cavernous carotid. Skull: Negative Sinuses/Orbits: Mild mucosal edema left maxillary sinus. Bilateral cataract removal. Other: None ASPECTS ([place] Stroke Program Early CT Score) - Ganglionic level infarction (caudate, lentiform nuclei, internal capsule, insula, M1-M3 cortex): 7 - Supraganglionic infarction (M4-M6 cortex): 3 Total score (0-10 with 10 being normal): 10 IMPRESSION: 1. Hyperdense right MCA compatible with thrombosis. No acute  infarct or hemorrhage 2. ASPECTS is 10 I have paged the stroke neurologist. Electronically Signed: By: Franchot Gallo M.D. On: 05/25/2017 11:35     STUDIES:  MRI last night showed an infarct involving the right caudate and patchy right subcortical infarcts on the right.  DISCUSSION:       This is a 84 year old with chronic atrial fibrillation who presented with left hemiplegia.  She was found to have a occluded left carotid which could not be recannulated.  She had a very large penumbra on perfusion study.  Remarkably she has not evolved an extremely large infarct on the MRI overnight.  ASSESSMENT / PLAN:  PULMONARY A: Decreased her sedation and performing a spontaneous breathing trial this morning.  I have explained to family that there is nothing wrong with her lungs but with that we may not be able to extubate the patient due to her mental status.  They are awaiting the arrival of a third family member and I have asked them to advise me what to do should we extubate the patient and she fails extubation.  CARDIOVASCULAR A: She is currently on Neo-Synephrine targeting a systolic pressure between 140 and 160 in the hopes of maintaining  collateral flow.  She is in chronic atrial fibrillation and not requiring an AV blocking agent.  I will discussed the introduction of a full dose anticoagulant with neurology.    GASTROINTESTINAL A: On Pepcid for GI prophylaxis  HEMATOLOGIC A: On subcutaneous heparin for DVT prophylaxis   NEUROLOGIC A: As noted she has suffered from a right basal ganglia infarct with multiple patchy infarcts in the subcortical region of the right hemisphere.  She continues on aspirin and a statin.  This is in the setting of chronic atrial fibrillation and I will discuss with neurology the introduction of full dose anticoagulation at some point.  We have asked family to consider what supportive care the patient would desire in the event that the stroke significantly  evolves.  32 minutes has been spent in the care of this patient today   Lars Masson, MD Pulmonary and [place] Pager: [phone]  05/26/2017, 7:12 AM

## 2017-05-26 NOTE — Progress Notes (Signed)
PT Cancellation Note  Patient Details Name: Chloe Lopez MRN: [id number] DOB: 22-Apr-1924   Cancelled Treatment:    Reason Eval/Treat Not Completed: Patient not medically ready. Pt remains intubated, per plan pt is weaning and the plan is to extubate today. PT to return as able s/p extubation.   Desarea Ohagan M Keriann Rankin 05/26/2017, 10:45 AM   Kittie Plater, PT, DPT Pager #: [phone] Office #: [phone]

## 2017-05-26 NOTE — Procedures (Signed)
Extubation Procedure Note  Patient Details:   Name: AGNES BRIGHTBILL DOB: [birth year] MRN: [id number]   Airway Documentation:     Evaluation  O2 sats: stable throughout Complications: No apparent complications Patient did tolerate procedure well. Bilateral Breath Sounds: Clear, Diminished   Yes   Positive cuff leak noted. Pt placed on [place] 4Lpm with humidity, no stridor noted, pt attempted IS without success will need more coaching. Family asked for assistance with coaching.   Bayard Beaver 05/26/2017, 12:34 PM

## 2017-05-26 NOTE — Anesthesia Postprocedure Evaluation (Signed)
Anesthesia Post Note  Patient: Chloe Lopez  Procedure(s) Performed: IR WITH ANESTHESIA CODE STROKE (N/A )     Patient location during evaluation: PACU Anesthesia Type: General Level of consciousness: awake and sedated Pain management: pain level controlled Vital Signs Assessment: post-procedure vital signs reviewed and stable Respiratory status: spontaneous breathing Cardiovascular status: stable Postop Assessment: no apparent nausea or vomiting Anesthetic complications: no    Last Vitals:  Vitals:   05/26/17 1930 05/26/17 1945  BP: (!) 160/78 (!) 160/83  Pulse: 89 93  Resp: 17 (!) 23  Temp:    SpO2: 97% 97%    Last Pain:  Vitals:   05/26/17 1600  TempSrc: Axillary   Pain Goal:                 Sigmund Morera JR,JOHN Johney Perotti

## 2017-05-26 NOTE — Progress Notes (Signed)
NEUROHOSPITALISTS STROKE TEAM - DAILY PROGRESS NOTE   ADMISSION HISTORY:  Chloe Lopez is a 84 y.o. female who has a past medical history of atrial fibrillation not on anticoagulation, peripheral neuropathy who is functionally independent at baseline, lives at home, who was in her usual state of health to the best of family's knowledge at 7 PM last night when the daughter spoke with her, and found this morning to be weak on the left side and gazing to the right.  It is unclear if she was seen anytime after 7 PM until being found this morning with left-sided weakness.  The daughter attempted to contact other family members who might have been in touch with her, but no information was available. The daughter says the patient could not move her left arm or leg, was getting to the right had left-sided facial weakness and droop along with slurred speech.  EMS was called.  They also noted left hemiplegia, right gaze preference and left facial weakness along with dysarthria.   She was brought into the emergency room.  Taken in for a noncontrast CT of the head that did not reveal any acute bleed but showed a hyperdense right MCA.  She was outside the window for IV TPA hence did not get TPA.  CT angiogram of the head and neck and CT perfusion study was done that showed right ICA occlusion at the bifurcation and a CT perfusion study that showed to 8 cc core and 132 cc area at risk.  Risks benefits of neuro intervention were discussed with the daughter and she was taken in for diagnostic cerebral angiogram and possible thrombectomy.  LKW: 1900 hrs. on 05/25/2017 tpa given?: no, outside the window Premorbid modified Rankin scale (mRS): 0  NIHSS 1a Level of Conscious.: 0 1b LOC Questions: 0 1c LOC Commands: 0 2 Best Gaze: 1 3 Visual: 2 4 Facial Palsy: 2 5a Motor Arm - left: 4 5b Motor Arm - Right: 0 6a Motor Leg - Left: 3 6b Motor Leg - Right: 0 7 Limb  Ataxia: 0 8 Sensory: 2 9 Best Language: 1 10 Dysarthria: 2 11 Extinct. and Inatten.: 2 TOTAL: 19  SUBJECTIVE (INTERVAL HISTORY) Two daughters are at the bedside today. Patient is found laying in bed in NAD, remains sedated and intubated. No change in neuro exam. No new/ acute events reported overnight.BP adequately controlled Family to decide code status later this evening once daughter from [place] arrives. POC reviewed and CCM to attempt extubation later today.   OBJECTIVE Lab Results: CBC:  Recent Labs  Lab 05/25/17 1118 05/25/17 1124  WBC 10.6*  --   HGB 15.7* 16.0*  HCT 46.6* 47.0*  MCV 95.9  --   PLT 276  --    BMP: Recent Labs  Lab 05/25/17 1118 05/25/17 1124 05/26/17 0730  NA 139 140 138  K 3.9 3.7 2.9*  CL 102 101 109  CO2 23  --  19*  GLUCOSE 118* 119* 110*  BUN 11 12 7   CREATININE 0.74 0.60 0.60  CALCIUM 9.0  --  8.1*   Liver Function Tests:  Recent Labs  Lab 05/25/17 1118  AST 20  ALT 11*  ALKPHOS 64  BILITOT 0.7  PROT 6.0*  ALBUMIN 3.3*   Coagulation Studies:  Recent Labs    05/25/17 1118  APTT 27  INR 1.03   PHYSICAL EXAM Temp:  [97.9 F (36.6 C)-99.2 F (37.3 C)] 99 F (37.2 C) (01/23 1222) Pulse Rate:  [37-111]  73 (01/23 1600) Resp:  [9-36] 20 (01/23 1600) BP: (96-190)/(52-145) 125/75 (01/23 1545) SpO2:  [82 %-100 %] 98 % (01/23 1600) Arterial Line BP: (89-171)/(59-102) 153/67 (01/23 1600) FiO2 (%):  [30 %] 30 % (01/23 0859) General - Well nourished, well developed, in no apparent distress HEENT-  Normocephalic,    Cardiovascular - Irregular rate and rhythm  Respiratory - Lungs clear bilaterally. No wheezing. Abdomen - soft and non-tender, BS normal Extremities- no edema or cyanosis Neurological exam- remains sedated and intubated Patient is somnolent but will briefly open eyes, Follows some simple commands No Speech output Cranial nerves: Pupils equal round reactive to light, right gaze preference able to cross midline to  look at the left, left homonymous hemianopsia, left lower facial weakness, decreased sensation in the left side of the face. Motor exam: 0/5 left upper extremity, 1/5 left lower extremity, 5/5 right upper and right lower extremities.+ withdrawal to pain on left Sensory exam: Dense loss with neglect on the left side. Coordination: cannot performed due to sedation Gait exam - not performed  IMAGING: I have personally reviewed the radiological images below and agree with the radiology interpretations. Ct Angio Head/Neck W Or Wo Contrast Result Date: 05/25/2017 IMPRESSION: 1. The right internal carotid artery is occluded at the carotid bifurcation. 2. The fetal type right posterior cerebral artery fills via a small right P1 segment to the right ICA terminus. 3. Right M1 occlusion with reconstitution of right MCA branch vessels beyond the bifurcation suggesting collateral flow. 4. Right A1 segment flow is likely retrograde. 5. No significant left-sided stenosis. 6. Aortic Atherosclerosis (ICD10-I70.0). No significant stenosis or aneurysm at the aortic arch. 7. Multilevel spondylosis of the cervical spine. These results were called by telephone at the time of interpretation on 05/25/2017 at 11:58 Am to Dr. Amie Portland , who verbally acknowledged these results. Electronically Signed   By: San Morelle M.D.   On: 05/25/2017 12:07   Mr Brain Wo Contrast Result Date: 05/25/2017  IMPRESSION: 1. Acute ischemic right MCA territory infarct involving the right basal ganglia, with additional scattered subcentimeter cortical subcortical infarcts within the overlying right cerebral hemisphere. No associated hemorrhage or mass effect. 2. Single punctate nonhemorrhagic left parietal cortical infarct as above. 3. Abnormal flow voids within the right ICA and M1 segment, consistent with previously identified occlusion. 4. Age-related atrophy with mild to moderate chronic small vessel ischemic disease. Electronically  Signed   By: Jeannine Boga M.D.   On: 05/25/2017 23:53   Ct Cerebral Perfusion W Contrast Result Date: 05/25/2017 IMPRESSION: 1. Large area of ischemic penumbra involving the right MCA territory with estimated volume of 130 mL and small core infarct involving the basal ganglia. The estimated core volume is 8 mm. 2. The right MCA ischemia corresponds to right ICA and M1 occlusions. Electronically Signed   By: San Morelle M.D.   On: 05/25/2017 12:09   Dg Chest Port 1 View Result Date: 05/25/2017 IMPRESSION: Satisfactory endotracheal tube position. Chronic bronchitic changes with minimal LEFT basilar atelectasis. Electronically Signed   By: Lavonia Dana M.D.   On: 05/25/2017 16:13   Ct Head Code Stroke Wo Contrast Addendum Date: 05/25/2017   Result Date: 05/25/2017 IMPRESSION: 1. Hyperdense right MCA compatible with thrombosis. No acute infarct or hemorrhage 2. ASPECTS is 10 I have paged the stroke neurologist. Electronically Signed: By: Franchot Gallo M.D. On: 05/25/2017 11:35   Echocardiogram:  PENDING    IMPRESSION: Ms. HATSUE SIME is a 84 y.o. female with PMH of atrial fibrillation not on anticoagulation, peripheral neuropathy functionally dependent at baseline lives at home in her usual state of health last known normal at 7 PM last night was found to have left-sided weakness and right gaze preference along with dysarthria and left facial weakness. NIH stroke scale 19 for a right MCA syndrome.  Noncontrast CT of the head shows no bleed.  CT aspects 728.  CT angiogram head and neck showed right ICA occlusion and right MCA occlusion in the M1 segment.  CT perfusion profile favorable for emergent intervention because of a small 8 cc core and large ischemic penumbra 130 cc.  Not a candidate for TPA because of outside the window.  Interventional team contacted, case discussed and patient going for diagnostic cerebral angiogram and possible  thrombectomy. .  Acute ischemic right MCA territory infarct -Right basal ganglia, with additional scattered subcentimeter cortical subcortical infarcts within the overlying right cerebral hemisphere Occlusion and stenosis of R carotid artery  Suspected Etiology: cardioembolic source, AFIB not on AC therapy Resultant Symptoms: Left sided defcits Stroke Risk Factors: atrial fibrillation and hypertension Other Stroke Risk Factors: Advanced age,   PROCEDURES:  05/25/2017 by Dr Estanislado Pandy s/p revascularization attempt, unable to achieve reperfusion.  Outstanding Stroke Work-up Studies:     Echocardiogram:                                                    PENDING  05/26/2017 ASSESSMENT:   Patient remains intubated and sedated. Following simple commands. Left hemiplegia unchanged. Plan for extubation later today. Family to make decisions on code status later this evening. Reports of AFIB with RVR overnight. Started Cardizem for rate control. Restarted home meds regime for Angioedema.  PLAN  05/26/2017: Continue Aspirin/ Statin Attempt extubation Frequent neuro checks Telemetry monitoring PT/OT/SLP Consult PM & Rehab Consult Case Management /MSW Ongoing aggressive stroke risk factor management Patient's family will be counseled to be compliant with her antithrombotic medications Patient's family will be counseled on Lifestyle modifications including, Diet, Exercise, and Stress Follow up with [facility] in 6 weeks  DYSPHAGIA: NPO until passes SLP swallow evaluation Aspiration Precautions in progress  MEDICAL ISSUES: ACUTE RESPIRATORY FAILURE: Venti Management per CCM team - Appreciate assistance Extubated today  AFIB, CHRONIC: Cardizem started today for rate control Will likely need AC therapy prior to discharge   HEME Hemoglobin 16 -Monitor -transfuse for hgb < 7 -Trend PT/PTT/INR  Fluid/Electrolyte Disorders-  Hypokalemia Check labs Replete per ICU  protocol  ID Possible Aspiration PNA -CXR -NPO -Monitor  R/O UTI: Cultures pending  HYPERTENSION: Stable SBP goal less than 180/90  Nicardipine drip, Labetolol PRN Long term BP goal normotensive. May slowly restart home B/P medications after 48 hours Home Meds: Cartia XT  HYPERLIPIDEMIA:    Component Value Date/Time   CHOL 141 05/26/2017 0414   TRIG 62 05/26/2017 0414   HDL 55 05/26/2017 0414   CHOLHDL 2.6 05/26/2017 0414   VLDL 12 05/26/2017 0414   LDLCALC 74 05/26/2017 0414  Home Meds:  NONE LDL  goal < 70 Started on Lipitor to 10 mg daily Continue statin at discharge  R/O DIABETES: Lab Results  Component Value Date   HGBA1C 5.5 05/26/2017  HgbA1c goal < 7.0 Continue CBG monitoring  and SSI to maintain glucose 140-180 mg/dl  Other Active Problems: Active Problems:   Stroke (cerebrum) (HCC)   Middle cerebral artery embolism, right    Hospital day # 1 VTE prophylaxis: Heparin Diet : Fall precautions Diet NPO time specified   FAMILY UPDATES: family at bedside  TEAM UPDATES: Garvin Fila, MD STATUS:  FULL   Prior Home Stroke Medications:  No antithrombotic  Discharge Stroke Meds:  Please discharge patient on TBD   Disposition: 01-Home or Self Care Therapy Recs:               CIR Home Equipment:         PENDING Follow Up:  Follow-up Information    Garvin Fila, MD. Schedule an appointment as soon as possible for a visit in 6 week(s).   Specialties:  Neurology, Radiology Contact information: [address] [place] [address] [phone]          Leeanne Rio, MD -PCP Follow up in 1-2 weeks     Assessment & plan discussed with with attending physician and they are in agreement.    Renie Ora Stroke Neurology Team 05/26/2017 4:26 PM I have personally examined this patient, reviewed notes, independently viewed imaging studies, participated in medical decision making and plan of care.ROS completed by me  personally and pertinent positives fully documented  I have made any additions or clarifications directly to the above note. Agree with note above.  She presented with right hemispheric infarct due to right M1 occlusion but likely has underlying chronic right ICA occlusion and hence could not undergo successful revascularization.  Plan continue strict blood pressure control and extubate today if tolerated.  Spoke to 2 daughters at the bedside and they are willing to discuss Silvis with her other daughter who arrives this evening.  Start aspirin for stroke prevention.  Discussed with Dr. Pearline Cables. This patient is critically ill and at significant risk of neurological worsening, death and care requires constant monitoring of vital signs, hemodynamics,respiratory and cardiac monitoring, extensive review of multiple databases, frequent neurological assessment, discussion with family, other specialists and medical decision making of high complexity.I have made any additions or clarifications directly to the above note.This critical care time does not reflect procedure time, or teaching time or supervisory time of PA/NP/Med Resident etc but could involve care discussion time.  I spent 35 minutes of neurocritical care time  in the care of  this patient.      Antony Contras, MD Medical Director [facility] Pager: [phone] 05/26/2017 6:19 PM  To contact Stroke Continuity provider, please refer to [email or web address] After hours, contact General Neurology

## 2017-05-26 NOTE — Progress Notes (Signed)
Per RN, pt placed on wean with vent by Dr. Pearline Cables.

## 2017-05-26 NOTE — Progress Notes (Signed)
  Echocardiogram 2D Echocardiogram has been performed.  Jannett Celestine 05/26/2017, 11:26 AM

## 2017-05-26 NOTE — Progress Notes (Signed)
SLP Cancellation Note  Patient Details Name: Chloe Lopez MRN: [id number] DOB: August 26, 1923   Cancelled treatment:       Reason Eval/Treat Not Completed: Medical issues which prohibited therapy; pt remains intubated.   Juan Quam Laurice 05/26/2017, 9:19 AM

## 2017-05-26 NOTE — Progress Notes (Signed)
Referring Physician(s): Dr. Rory Percy  Supervising Physician: Luanne Bras  Patient Status:  [facility] - In-pt  Chief Complaint: R MCA occlusion  Subjective: Intubated, restless.  Appears purposeful with movement and followed command to move right toes.   Allergies: Azithromycin and Benzonatate  Medications: Prior to Admission medications   Medication Sig Start Date End Date Taking? Authorizing Provider  CARTIA XT 180 MG 24 hr capsule TAKE ONE CAPSULE BY MOUTH DAILY 03/31/17  Yes Leeanne Rio, MD  doxepin (SINEQUAN) 10 MG capsule Take 3 capsules (30 mg total) by mouth at bedtime. Patient taking differently: Take 20-30 mg by mouth at bedtime.  08/18/16  Yes Kozlow, Donnamarie Poag, MD  EPINEPHrine (EPIPEN 2-PAK) 0.3 mg/0.3 mL IJ SOAJ injection Inject 0.3 mg into the muscle once. Reported on 08/30/2015   Yes [provider]  predniSONE (DELTASONE) 10 MG tablet TAKE AS DIRECTED. ALTERNATE 10 MG ONE DAY THEN 5 MG THE NEXT. Patient taking differently: Take 5-10 mg by mouth See admin instructions. Alternate every over day with 5mg  and 10mg . 04/30/17  Yes Kozlow, Donnamarie Poag, MD  ranitidine (ZANTAC) 150 MG tablet Take one tablet twice a day. Patient taking differently: Take 150 mg by mouth daily as needed for heartburn.  08/18/16  Yes Kozlow, Donnamarie Poag, MD  traMADol (ULTRAM) 50 MG tablet Take 1 tablet (50 mg total) daily as needed by mouth (pain in legs). 03/19/17  Yes Leeanne Rio, MD     Vital Signs: BP 111/61   Pulse 69   Temp 99.2 F (37.3 C) (Axillary)   Resp 18   Ht 5\' 5"  (1.651 m)   Wt 151 lb 14.4 oz (68.9 kg)   SpO2 97%   BMI 25.28 kg/m   Physical Exam  NAD, alert Neuro: sedated,  Moving right side only.  Still no movement with left.  Follows some commands.  Does not open eyes. Groin:  Skin intact, soft.  No evidence of hematoma or pseudoaneurysm.   Imaging: Ct Angio Head W Or Wo Contrast  Result Date: 05/25/2017 CLINICAL DATA:  Focal neuro deficit for left  6 hours. Stroke suspected. New onset of left-sided weakness, slurred speech, right-sided gaze. Symptoms began at 7 p.m. last evening. EXAM: CT ANGIOGRAPHY HEAD AND NECK TECHNIQUE: Multidetector CT imaging of the head and neck was performed using the standard protocol during bolus administration of intravenous contrast. Multiplanar CT image reconstructions and MIPs were obtained to evaluate the vascular anatomy. Carotid stenosis measurements (when applicable) are obtained utilizing NASCET criteria, using the distal internal carotid diameter as the denominator. CONTRAST:  9mL ISOVUE-370 IOPAMIDOL (ISOVUE-370) INJECTION 76% COMPARISON:  CT head without contrast from the same day. MRI brain 01/03/2011 FINDINGS: CTA NECK FINDINGS Aortic arch: A 4 vessel aortic arch is present. The left vertebral artery originates directly from the aorta. Atherosclerotic calcifications are present at the aorta. There is no aneurysm. There is no focal stenosis of the great vessel origins. Right carotid system: The right common carotid artery is within normal limits. The right internal carotid artery is occluded at the bifurcation. There is no reconstitution in the neck. Left carotid system: The left common carotid artery is within normal limits. Bifurcation is unremarkable. The cervical left ICA demonstrates moderate tortuosity without a significant stenosis of greater than 50% relative to the more distal vessel. Vertebral arteries: The left vertebral artery originates from the aortic arch. The right vertebral artery is slightly dominant. It originates from the right subclavian artery without a significant stenosis. No  significant focal stenosis or vascular injury to either vertebral artery in the neck. Skeleton: Chronic endplate degenerative changes and loss of disc height are present at C4-5, C5-6, and C6-7. Foraminal narrowing is greatest on the left at C5-6. No focal lytic or blastic lesions are present. Other neck: Calcifications  palatine tonsils are compatible with prior infection. No focal mucosal or submucosal lesions are present. Thyroid is heterogeneous without a dominant lesion. No significant adenopathy is present. Salivary glands are within normal limits. Upper chest: The lung apices are clear. Thoracic inlet is within normal limits. Review of the MIP images confirms the above findings CTA HEAD FINDINGS Anterior circulation: Atherosclerotic calcifications are present within the cavernous left internal carotid artery without a significant stenosis through the ICA terminus. The right ICA terminus fills via a right posterior communicating artery. The right M1 artery also contribute. There is retrograde filling of the right A1 segment the anterior communicating artery. Left A1 and M1 segments are normally opacified. The right M1 segment is occluded. There is reconstitution of right MCA branch vessels at the bifurcation suggesting. Collateral flow. There is some attenuation of right MCA branch vessels compared to left. ACA branch vessels are intact bilaterally. No aneurysms. Posterior circulation: The right vertebral artery is slightly dominant to the left. PICA origins are visualized and normal. The vertebrobasilar junction is normal. The basilar artery is normal. The left posterior cerebral artery originates from basilar tip. Right posterior cerebral artery is of fetal type with a small right P1 segment now filling the artery. PCA branch vessels are within normal limits bilaterally. Venous sinuses: The dural sinuses are patent. The left transverse sinus is dominant. The straight sinus and deep cerebral veins are intact. Cortical veins are unremarkable. Anatomic variants: Fetal type right posterior cerebral artery, now fed by a small right P1 segment. Delayed phase: Not performed in the emergency set. Review of the MIP images confirms the above findings IMPRESSION: 1. The right internal carotid artery is occluded at the carotid  bifurcation. 2. The fetal type right posterior cerebral artery fills via a small right P1 segment to the right ICA terminus. 3. Right M1 occlusion with reconstitution of right MCA branch vessels beyond the bifurcation suggesting collateral flow. 4. Right A1 segment flow is likely retrograde. 5. No significant left-sided stenosis. 6. Aortic Atherosclerosis (ICD10-I70.0). No significant stenosis or aneurysm at the aortic arch. 7. Multilevel spondylosis of the cervical spine. These results were called by telephone at the time of interpretation on 05/25/2017 at 11:58 Am to Dr. Amie Portland , who verbally acknowledged these results. Electronically Signed   By: San Morelle M.D.   On: 05/25/2017 12:07   Ct Angio Neck W Or Wo Contrast  Result Date: 05/25/2017 CLINICAL DATA:  Focal neuro deficit for left 6 hours. Stroke suspected. New onset of left-sided weakness, slurred speech, right-sided gaze. Symptoms began at 7 p.m. last evening. EXAM: CT ANGIOGRAPHY HEAD AND NECK TECHNIQUE: Multidetector CT imaging of the head and neck was performed using the standard protocol during bolus administration of intravenous contrast. Multiplanar CT image reconstructions and MIPs were obtained to evaluate the vascular anatomy. Carotid stenosis measurements (when applicable) are obtained utilizing NASCET criteria, using the distal internal carotid diameter as the denominator. CONTRAST:  75mL ISOVUE-370 IOPAMIDOL (ISOVUE-370) INJECTION 76% COMPARISON:  CT head without contrast from the same day. MRI brain 01/03/2011 FINDINGS: CTA NECK FINDINGS Aortic arch: A 4 vessel aortic arch is present. The left vertebral artery originates directly from the aorta. Atherosclerotic calcifications are  present at the aorta. There is no aneurysm. There is no focal stenosis of the great vessel origins. Right carotid system: The right common carotid artery is within normal limits. The right internal carotid artery is occluded at the bifurcation.  There is no reconstitution in the neck. Left carotid system: The left common carotid artery is within normal limits. Bifurcation is unremarkable. The cervical left ICA demonstrates moderate tortuosity without a significant stenosis of greater than 50% relative to the more distal vessel. Vertebral arteries: The left vertebral artery originates from the aortic arch. The right vertebral artery is slightly dominant. It originates from the right subclavian artery without a significant stenosis. No significant focal stenosis or vascular injury to either vertebral artery in the neck. Skeleton: Chronic endplate degenerative changes and loss of disc height are present at C4-5, C5-6, and C6-7. Foraminal narrowing is greatest on the left at C5-6. No focal lytic or blastic lesions are present. Other neck: Calcifications palatine tonsils are compatible with prior infection. No focal mucosal or submucosal lesions are present. Thyroid is heterogeneous without a dominant lesion. No significant adenopathy is present. Salivary glands are within normal limits. Upper chest: The lung apices are clear. Thoracic inlet is within normal limits. Review of the MIP images confirms the above findings CTA HEAD FINDINGS Anterior circulation: Atherosclerotic calcifications are present within the cavernous left internal carotid artery without a significant stenosis through the ICA terminus. The right ICA terminus fills via a right posterior communicating artery. The right M1 artery also contribute. There is retrograde filling of the right A1 segment the anterior communicating artery. Left A1 and M1 segments are normally opacified. The right M1 segment is occluded. There is reconstitution of right MCA branch vessels at the bifurcation suggesting. Collateral flow. There is some attenuation of right MCA branch vessels compared to left. ACA branch vessels are intact bilaterally. No aneurysms. Posterior circulation: The right vertebral artery is  slightly dominant to the left. PICA origins are visualized and normal. The vertebrobasilar junction is normal. The basilar artery is normal. The left posterior cerebral artery originates from basilar tip. Right posterior cerebral artery is of fetal type with a small right P1 segment now filling the artery. PCA branch vessels are within normal limits bilaterally. Venous sinuses: The dural sinuses are patent. The left transverse sinus is dominant. The straight sinus and deep cerebral veins are intact. Cortical veins are unremarkable. Anatomic variants: Fetal type right posterior cerebral artery, now fed by a small right P1 segment. Delayed phase: Not performed in the emergency set. Review of the MIP images confirms the above findings IMPRESSION: 1. The right internal carotid artery is occluded at the carotid bifurcation. 2. The fetal type right posterior cerebral artery fills via a small right P1 segment to the right ICA terminus. 3. Right M1 occlusion with reconstitution of right MCA branch vessels beyond the bifurcation suggesting collateral flow. 4. Right A1 segment flow is likely retrograde. 5. No significant left-sided stenosis. 6. Aortic Atherosclerosis (ICD10-I70.0). No significant stenosis or aneurysm at the aortic arch. 7. Multilevel spondylosis of the cervical spine. These results were called by telephone at the time of interpretation on 05/25/2017 at 11:58 Am to Dr. Amie Portland , who verbally acknowledged these results. Electronically Signed   By: San Morelle M.D.   On: 05/25/2017 12:07   Mr Brain Wo Contrast  Result Date: 05/25/2017 CLINICAL DATA:  Initial evaluation for acute left-sided weakness, found have right ICA occlusion with right MCA embolism, status post attempted catheter directed  revascularization. EXAM: MRI HEAD WITHOUT CONTRAST TECHNIQUE: Multiplanar, multiecho pulse sequences of the brain and surrounding structures were obtained without intravenous contrast. COMPARISON:   Comparison made with prior CTs from earlier the same day. FINDINGS: Brain: Generalized age-related cerebral atrophy. Patchy and confluent T2/FLAIR hyperintensity within the periventricular and deep white matter both cerebral hemispheres, most consistent with chronic small vessel ischemic disease, mild to moderate nature. Abnormal restricted diffusion involving the right caudate and lentiform nucleus, extending into the right subinsular white matter, consistent with acute ischemic infarct. Area of infarction measures 4.8 x 2.3 x 5.2 cm. Localized edema without significant mass effect. No associated hemorrhage. Additional patchy subcentimeter cortical and subcortical infarcts seen within the overlying right cerebral hemisphere with involvement of the right frontal parietal, and occipital lobes. No associated edema or mass effect. Single subcentimeter nonhemorrhagic punctate cortical infarct noted within the left parietal lobe as well (series 3, image 40). No other evidence for acute or subacute ischemia. Gray-white matter differentiation otherwise maintained. No mass lesion, midline shift or mass effect. No hydrocephalus. No extra-axial fluid collection. Pituitary gland suprasellar region normal. Midline structures intact and normal. Vascular: Abnormal flow void within the right ICA to the level of the terminus, consistent with known occlusion. Right M1 segment also appears occluded. Normal intravascular flow voids seen within the remainder of the major intracranial arterial vasculature at the skull base. Skull and upper cervical spine: Craniocervical junction normal. Upper cervical spine normal. Bone marrow signal intensity normal. No scalp soft tissue abnormality. Sinuses/Orbits: Globes oral soft tissues within normal limits. Patient status post cataract extraction bilaterally. Air-fluid level noted within the left maxillary sinus. Layering fluid noted within the nasopharynx. Patient likely intubated. Trace opacity  noted within the right mastoid air cells. Inner ear structures normal. Other: None. IMPRESSION: 1. Acute ischemic right MCA territory infarct involving the right basal ganglia, with additional scattered subcentimeter cortical subcortical infarcts within the overlying right cerebral hemisphere. No associated hemorrhage or mass effect. 2. Single punctate nonhemorrhagic left parietal cortical infarct as above. 3. Abnormal flow voids within the right ICA and M1 segment, consistent with previously identified occlusion. 4. Age-related atrophy with mild to moderate chronic small vessel ischemic disease. Electronically Signed   By: Jeannine Boga M.D.   On: 05/25/2017 23:53   Ct Cerebral Perfusion W Contrast  Result Date: 05/25/2017 CLINICAL DATA:  Focal neuro deficit, less than 6 hours, stroke suspected. Acute onset of left-sided weakness. Last seen well at 7 p.m. last evening. EXAM: CT PERFUSION BRAIN TECHNIQUE: Multiphase CT imaging of the brain was performed following IV bolus contrast injection. Subsequent parametric perfusion maps were calculated using RAPID software. CONTRAST:  74mL ISOVUE-370 IOPAMIDOL (ISOVUE-370) INJECTION 76% COMPARISON:  CT head and CTA head and neck from the same day. FINDINGS: CT Brain Perfusion Findings: CBF (<30%) Volume: 81mL Perfusion (Tmax>6.0s) volume: 177mL Mismatch Volume: 121mL Infarction Location:Right MCA territory IMPRESSION: 1. Large area of ischemic penumbra involving the right MCA territory with estimated volume of 130 mL and small core infarct involving the basal ganglia. The estimated core volume is 8 mm. 2. The right MCA ischemia corresponds to right ICA and M1 occlusions. Electronically Signed   By: San Morelle M.D.   On: 05/25/2017 12:09   Dg Chest Port 1 View  Result Date: 05/25/2017 CLINICAL DATA:  Intubation, stroke EXAM: PORTABLE CHEST 1 VIEW COMPARISON:  Portable exam 1546 hours compared to 08/25/2016 FINDINGS: Tip of endotracheal tube projects  4.4 cm above carina. Nasogastric tube extends into stomach. Normal heart  size, mediastinal contours, and pulmonary vascularity. Atherosclerotic calcifications aorta. Chronic bronchitic changes with mild RIGHT basilar atelectasis. No acute infiltrate, pleural effusion or pneumothorax. Bones demineralized with chronic RIGHT rotator cuff tear and a sclerotic lesion at the proximal LEFT humerus unchanged favor enchondroma less likely sequela of bone infarct. IMPRESSION: Satisfactory endotracheal tube position. Chronic bronchitic changes with minimal LEFT basilar atelectasis. Electronically Signed   By: Lavonia Dana M.D.   On: 05/25/2017 16:13   Ct Head Code Stroke Wo Contrast  Addendum Date: 05/25/2017   ADDENDUM REPORT: 05/25/2017 11:59 ADDENDUM: Hypodensity in the anterior limb right internal capsule and right insula suggestive of acute infarct. Updated aspects score 8 These results were called by telephone at the time of interpretation on 05/25/2017 at 11:58 am to Dr. Alanson Puls, who verbally acknowledged these results. Electronically Signed   By: Franchot Gallo M.D.   On: 05/25/2017 11:59   Result Date: 05/25/2017 CLINICAL DATA:  Code stroke.  Aphasia.  Right-sided gaze EXAM: CT HEAD WITHOUT CONTRAST TECHNIQUE: Contiguous axial images were obtained from the base of the skull through the vertex without intravenous contrast. COMPARISON:  MRI head 01/03/2011 FINDINGS: Brain: Mild atrophy. Chronic microvascular ischemic changes in the white matter. Negative for acute cortical infarct. Negative for hemorrhage or mass. Vascular: Hyperdense right MCA compatible with thrombus. This extends into the cavernous carotid. Skull: Negative Sinuses/Orbits: Mild mucosal edema left maxillary sinus. Bilateral cataract removal. Other: None ASPECTS ([place] Stroke Program Early CT Score) - Ganglionic level infarction (caudate, lentiform nuclei, internal capsule, insula, M1-M3 cortex): 7 - Supraganglionic infarction (M4-M6 cortex): 3  Total score (0-10 with 10 being normal): 10 IMPRESSION: 1. Hyperdense right MCA compatible with thrombosis. No acute infarct or hemorrhage 2. ASPECTS is 10 I have paged the stroke neurologist. Electronically Signed: By: Franchot Gallo M.D. On: 05/25/2017 11:35    Labs:  CBC: Recent Labs    08/25/16 1022 08/26/16 0256 05/25/17 1118 05/25/17 1124  WBC 8.9 8.8 10.6*  --   HGB 15.9* 13.9 15.7* 16.0*  HCT 47.9* 43.6 46.6* 47.0*  PLT 283 253 276  --     COAGS: Recent Labs    05/25/17 1118  INR 1.03  APTT 27    BMP: Recent Labs    08/25/16 1022 08/26/16 0256 05/25/17 1118 05/25/17 1124  NA 139 138 139 140  K 4.3 3.7 3.9 3.7  CL 108 105 102 101  CO2 22 26 23   --   GLUCOSE 137* 92 118* 119*  BUN 14 18 11 12   CALCIUM 9.6 9.1 9.0  --   CREATININE 0.75 0.75 0.74 0.60  GFRNONAA >60 >60 >60  --   GFRAA >60 >60 >60  --     LIVER FUNCTION TESTS: Recent Labs    05/25/17 1118  BILITOT 0.7  AST 20  ALT 11*  ALKPHOS 64  PROT 6.0*  ALBUMIN 3.3*    Assessment and Plan: R MCA CVA s/p revascularization attempt, unable to achieve reperfusion. Patient remains intubated today.  Groin intact, soft.  Daughters at bedside.  IR available if needed.  Electronically Signed: Docia Barrier, PA 05/26/2017, 12:12 PM   I spent a total of 15 Minutes at the the patient's bedside AND on the patient's hospital floor or unit, greater than 50% of which was counseling/coordinating care for CVA

## 2017-05-26 NOTE — Progress Notes (Signed)
Patient is NPO and had no OG or NG tube. RN held oral diltiazem per physician order.

## 2017-05-26 NOTE — Evaluation (Signed)
Occupational Therapy Evaluation Patient Details Name: Chloe Lopez MRN: [id number] DOB: 03-21-1924 Today's Date: 05/26/2017    History of Present Illness 84 yo female with chronic atrial fibrillation who was found by daughter with left hemiplegia. Brought to ED where CT was remarkable only for a dense right MCA. MRI showing Acute ischemic right MCA territory infarct involving the right.   Clinical Impression   PTA, pt was living alone and was independent with ADLs and IADLs. Pt currently requiring Max A-Total A for ADLs and bed mobility. Pt presenting with decreased arousal, balance, functional use of LUE, and cognition. Pt with good family support. Currently limited by lethargy. Pt will required further acute OT to increase occupational performance and participation. Recommend dc to CIR for post-acute OT to optimize safety and independence with ADLs as well as decreased caregiver burden. Pending pt progress and arousal, may need SNF for post-acute rehab.    Follow Up Recommendations  CIR;Supervision/Assistance - 24 hour    Equipment Recommendations  Other (comment)(Defer to next venue)    Recommendations for Other Services Rehab consult;PT consult;Speech consult     Precautions / Restrictions Precautions Precautions: Fall Restrictions Weight Bearing Restrictions: No      Mobility Bed Mobility Overal bed mobility: Needs Assistance Bed Mobility: Supine to Sit;Sit to Supine     Supine to sit: Max assist;+2 for physical assistance Sit to supine: Total assist;+2 for physical assistance   General bed mobility comments: Pt requiring Max-Total A +2 for bed mobility to manage trunk and BLEs.  Transfers                      Balance Overall balance assessment: Needs assistance Sitting-balance support: No upper extremity supported;Feet supported Sitting balance-Leahy Scale: Poor Sitting balance - Comments: Requiring physical A to maintain sitting at EOB. Tolerated ~10  minutes of sitting. Pt with active pushing back into posterior lean. Pt with L lateral lean. Postural control: Left lateral lean;Other (comment)(Pushing back)                                 ADL either performed or assessed with clinical judgement   ADL Overall ADL's : Needs assistance/impaired                                       General ADL Comments: Pt requiring Max-total A for ADLs and bed mobility. Pt bringing wash clothe to mouth to cough into, but does not follow commands to perform grooming. Pt requiring Max A to perform oral care and will follow command to open mouth. Pt requiring support at R elbow and then holding OT's wrist to facilitate oral care.     Vision Baseline Vision/History: Wears glasses Wears Glasses: Reading only Patient Visual Report: Other (comment)(Unsure due to lethargy) Additional Comments: Will assess at later     Perception     Praxis      Pertinent Vitals/Pain Pain Assessment: Faces Faces Pain Scale: No hurt Pain Intervention(s): Monitored during session     Hand Dominance Right   Extremity/Trunk Assessment Upper Extremity Assessment Upper Extremity Assessment: Difficult to assess due to impaired cognition;LUE deficits/detail LUE Deficits / Details: Pt with pain reaction. Pt with decreased strength and funcitonal use. Difficulty to assess due to arousal LUE Coordination: decreased fine motor;decreased gross motor  Communication Communication Communication: Other (comment)(Increased lethargy)   Cognition Arousal/Alertness: Lethargic Behavior During Therapy: Flat affect Overall Cognitive Status: Difficult to assess Area of Impairment: Following commands                       Following Commands: Follows one step commands inconsistently       General Comments: Pt with poor arousal. Keeping eyes closed for majority of session.   General Comments  Daughter present throughout     Exercises     Shoulder Instructions      Home Living Family/patient expects to be discharged to:: Private residence Living Arrangements: Alone Available Help at Discharge: Other (Comment)(undecided amongst sisters ) Type of Home: Apartment Home Access: Stairs to enter [employer] of Steps: 2 (fire escape ) Entrance Stairs-Rails: Can reach both Home Layout: One level     Bathroom Shower/Tub: Advertising copywriter: (Not sure )   Home Equipment: None;Cane - single point   Additional Comments: "[name]" Family unsure of which home pt will go at dc, but pt has full family support      Prior Functioning/Environment Level of Independence: Independent        Comments: ADLs, IADLs, enjoys gardening and reading.Does not drive        OT Problem List: Decreased strength;Decreased range of motion;Decreased activity tolerance;Impaired balance (sitting and/or standing);Impaired vision/perception;Decreased coordination;Decreased cognition;Decreased safety awareness;Impaired UE functional use      OT Treatment/Interventions: Self-care/ADL training;Therapeutic exercise;Energy conservation;DME and/or AE instruction;Therapeutic activities;Patient/family education    OT Goals(Current goals can be found in the care plan section) Acute Rehab OT Goals Patient Stated Goal: Unstated OT Goal Formulation: With family Time For Goal Achievement: 06/09/17 Potential to Achieve Goals: Good ADL Goals Pt Will Perform Grooming: with mod assist;sitting Pt Will Perform Upper Body Bathing: with mod assist;sitting Pt Will Transfer to Toilet: with max assist;with +2 assist;bedside commode Additional ADL Goal #1: Pt will perform bed mobility with Mod A +2 in preparation for ADLs Additional ADL Goal #2: Pt will maintain sitting at EOB with Min A for ~5-7 minutes in preparation for ADLs  OT Frequency: Min 2X/week   Barriers to D/C:             Co-evaluation              AM-PAC PT "6 Clicks" Daily Activity     Outcome Measure Help from another person eating meals?: Total Help from another person taking care of personal grooming?: A Lot Help from another person toileting, which includes using toliet, bedpan, or urinal?: Total Help from another person bathing (including washing, rinsing, drying)?: Total Help from another person to put on and taking off regular upper body clothing?: Total Help from another person to put on and taking off regular lower body clothing?: Total 6 Click Score: 7   End of Session Equipment Utilized During Treatment: Oxygen Nurse Communication: Mobility status;Other (comment)(Need external cath placed)  Activity Tolerance: Patient limited by lethargy Patient left: in bed;with call bell/phone within reach;with SCD's reapplied;with restraints reapplied;with family/visitor present  OT Visit Diagnosis: Unsteadiness on feet (R26.81);Other abnormalities of gait and mobility (R26.89);Muscle weakness (generalized) (M62.81);Other symptoms and signs involving cognitive function;Hemiplegia and hemiparesis Hemiplegia - Right/Left: Left Hemiplegia - dominant/non-dominant: Non-Dominant Hemiplegia - caused by: Cerebral infarction                Time: 1610-9604 OT Time Calculation (min): 28 min Charges:  OT General Charges $OT Visit:  1 Visit OT Evaluation $OT Eval Moderate Complexity: 1 Mod OT Treatments $Self Care/Home Management : 8-22 mins G-Codes:     Earlie Arciga MSOT, OTR/L Acute Rehab Pager: [phone] Office: [place] 05/26/2017, 3:13 PM

## 2017-05-27 ENCOUNTER — Encounter (HOSPITAL_COMMUNITY): Payer: Self-pay | Admitting: Interventional Radiology

## 2017-05-27 DIAGNOSIS — R414 Neurologic neglect syndrome: Secondary | ICD-10-CM

## 2017-05-27 DIAGNOSIS — I6601 Occlusion and stenosis of right middle cerebral artery: Secondary | ICD-10-CM

## 2017-05-27 DIAGNOSIS — G8190 Hemiplegia, unspecified affecting unspecified side: Secondary | ICD-10-CM

## 2017-05-27 DIAGNOSIS — I679 Cerebrovascular disease, unspecified: Secondary | ICD-10-CM

## 2017-05-27 LAB — ECHOCARDIOGRAM COMPLETE
HEIGHTINCHES: 65 in
WEIGHTICAEL: 2430.35 [oz_av]

## 2017-05-27 MED ORDER — ASPIRIN 300 MG RE SUPP
300.0000 mg | Freq: Once | RECTAL | Status: AC
  Administered 2017-05-27: 300 mg via RECTAL
  Filled 2017-05-27: qty 1

## 2017-05-27 NOTE — Progress Notes (Signed)
Pt currently not at a level to tolerate inpt rehab venue. Dr. Letta Pate recommends SNF. I will follow her progress at a distance. [phone]

## 2017-05-27 NOTE — Consult Note (Signed)
Physical Medicine and Rehabilitation Consult Reason for Consult: Left-sided weakness Referring Physician: Dr. Leonie Man   HPI: Chloe Lopez is a 84 y.o. right handed female with history of atrial fibrillation not on anticoagulation. Per report lives alone independent with a occasional cane prior to admission. One level apartment with 2 steps to entry. She does have documented family in the area question assistance on discharge. Presented 05/25/2017 with left-sided weakness in right gaze deviation. Cranial CT scan showed hypodensity anterior limb right internal capsule and right insula suggestive of acute infarction. CT angiogram head and neck showed right internal carotid artery occluded at the carotid bifurcation. Right M1 occlusion with reconstitution of right MCA branch vessels. CT cerebral perfusion scan showed large area of ischemic penumbra involving the right MCA territory. Patient did not receive TPA. Follow-up MRI acute ischemic right MCA territory infarct involving right basal ganglia with additional scattered subcentimeter cortical subcortical infarcts overlying right cerebral hemisphere. Single punctate nonhemorrhagic left parietal cortical infarct. Attempts at revascularization unsuccessful. Patient remained intubated until 05/26/2017. Echocardiogram with ejection fraction of 60% no wall motion abnormalities. Presently on aspirin for CVA prophylaxis. Subcutaneous heparin for DVT prophylaxis. Await formal swallow study. Physical therapy occupational therapy evaluations completed with recommendations of physical medicine rehabilitation consult.   Review of Systems  Unable to perform ROS: Other  Constitutional: Negative for chills and fever.  HENT: Negative for hearing loss.   Eyes: Negative for blurred vision and double vision.  Respiratory: Negative for cough and shortness of breath.   Cardiovascular: Positive for palpitations and leg swelling.  Gastrointestinal: Positive for  constipation. Negative for nausea and vomiting.  Genitourinary: Negative for dysuria, flank pain and hematuria.  Musculoskeletal: Positive for myalgias.  Neurological: Positive for focal weakness.  Psychiatric/Behavioral: Positive for depression.  All other systems reviewed and are negative.  Past Medical History:  Diagnosis Date  . Angioedema   . Depression   . Dysphagia, pharyngoesophageal phase 07/07/2012  . Left hip pain 07/22/2011  . Leg swelling 11/02/2012  . Sciatica 08/21/2011   Started recently in left hip. Takes tramadol. Does not want surgery. Happens once a week or so.    . Sciatica 04/19/2013  . Sweating 01/21/2012   Might relate to Doxepin   . Urticaria    Past Surgical History:  Procedure Laterality Date  . ABDOMINAL HYSTERECTOMY    . IR ANGIO INTRA EXTRACRAN SEL COM CAROTID INNOMINATE UNI R MOD SED  05/25/2017  . IR ANGIO VERTEBRAL SEL SUBCLAVIAN INNOMINATE UNI R MOD SED  05/25/2017  . IR PERCUTANEOUS ART THROMBECTOMY/INFUSION INTRACRANIAL INC DIAG ANGIO  05/25/2017  . MASTECTOMY, PARTIAL  2009  . RADIOLOGY WITH ANESTHESIA N/A 05/25/2017   Procedure: IR WITH ANESTHESIA CODE STROKE;  Surgeon: Luanne Bras, MD;  Location: [facility];  Service: Radiology;  Laterality: N/A;   No family history on file. Social History:  reports that she quit smoking about 21 years ago. she has never used smokeless tobacco. She reports that she drinks about 1.2 oz of alcohol per week. She reports that she does not use drugs. Allergies:  Allergies  Allergen Reactions  . Azithromycin Swelling    Reportedly had reaction September 2017 - caused worsening angioedema. Unclear if it was this or the benzonatate she started at the same time.  . Benzonatate Swelling    Reportedly had reaction September 2017 - caused worsening angioedema. Unclear if it was this or the azithromycin she started at the same time.   Medications Prior to  Admission  Medication Sig Dispense Refill  . CARTIA XT 180 MG 24 hr  capsule TAKE ONE CAPSULE BY MOUTH DAILY 30 capsule 4  . doxepin (SINEQUAN) 10 MG capsule Take 3 capsules (30 mg total) by mouth at bedtime. (Patient taking differently: Take 20-30 mg by mouth at bedtime. ) 270 capsule 1  . EPINEPHrine (EPIPEN 2-PAK) 0.3 mg/0.3 mL IJ SOAJ injection Inject 0.3 mg into the muscle once. Reported on 08/30/2015    . predniSONE (DELTASONE) 10 MG tablet TAKE AS DIRECTED. ALTERNATE 10 MG ONE DAY THEN 5 MG THE NEXT. (Patient taking differently: Take 5-10 mg by mouth See admin instructions. Alternate every over day with 50m and 161m) 45 tablet 1  . ranitidine (ZANTAC) 150 MG tablet Take one tablet twice a day. (Patient taking differently: Take 150 mg by mouth daily as needed for heartburn. ) 180 tablet 1  . traMADol (ULTRAM) 50 MG tablet Take 1 tablet (50 mg total) daily as needed by mouth (pain in legs). 30 tablet 3    Home: Home Living Family/patient expects to be discharged to:: Private residence Living Arrangements: Alone Available Help at Discharge: Family, Available PRN/intermittently(unsure if they can provide 24hrs yet) Type of Home: Apartment Home Access: Stairs to enter [employer] Steps: 2  Entrance Stairs-Rails: Can reach both Home Layout: One level Bathroom Shower/Tub: [employer](Not sure ) Home Equipment: None, Cane - single point Additional Comments: "RuRod HollerFamily unsure of which home pt will go at dc, but pt has full family support  Functional History: Prior Function Level of Independence: Independent Comments: enjoys gardening and reading Functional Status:  Mobility: Bed Mobility Overal bed mobility: Needs Assistance Bed Mobility: Rolling, Sidelying to Sit Rolling: Mod assist Sidelying to sit: Max assist Supine to sit: Max assist, +2 for physical assistance Sit to supine: Total assist, +2 for physical assistance General bed mobility comments: cues for sequence to roll  left and rise from side to sitting. pt with left lean in sitting and able to achieve midline after reaching for rail to right x2 and propping on right forearm x 2 Transfers Overall transfer level: Needs assistance Transfers: Squat Pivot Transfers Squat pivot transfers: Max assist, +2 physical assistance, +2 safety/equipment General transfer comment: max assist with pad and belt to squat pivot to pt's left with left knee blocked, cues for positioning and sequence with physical assist for anterior translation and rotation to chair Ambulation/Gait General Gait Details: unable    ADL: ADL Overall ADL's : Needs assistance/impaired General ADL Comments: Pt requiring Max-total A for ADLs and bed mobility. Pt bringing wash clothe to mouth to cough into, but does not follow commands to perform grooming. Pt requiring Max A to perform oral care and will follow command to open mouth. Pt requiring support at R elbow and then holding OT's wrist to facilitate oral care.  Cognition: Cognition Overall Cognitive Status: Impaired/Different from baseline Orientation Level: Oriented to person, Oriented to place, Oriented to situation, Disoriented to time Cognition Arousal/Alertness: Lethargic Behavior During Therapy: Flat affect Overall Cognitive Status: Impaired/Different from baseline Area of Impairment: Orientation, Attention, Memory, Following commands, Safety/judgement Orientation Level: Disoriented to, Time, Situation, Place Current Attention Level: Focused Memory: Decreased short-term memory Following Commands: Follows one step commands inconsistently, Follows one step commands with increased time Safety/Judgement: Decreased awareness of deficits, Decreased awareness of safety General Comments: pt stating she is at home, aware of daughter in room, needs increased time for commands and constant cues to  open eyes Difficult to assess due to: Level of arousal  Blood pressure 123/66, pulse 75,  temperature 99.6 F (37.6 C), temperature source Axillary, resp. rate (!) 24, height _0  (1.651 m), weight 68.9 kg (151 lb 14.4 oz), SpO2 94 %. Physical Exam  Vitals reviewed. HENT:  Head: Normocephalic.  Eyes:  Pupils round and reactive to light  Neck: Normal range of motion. Neck supple. No thyromegaly present.  Cardiovascular:  Cardiac rate controlled  Respiratory: Effort normal and breath sounds normal. No respiratory distress.  GI: Soft. Bowel sounds are normal. She exhibits no distension.  Neurological:  Patient sitting up in recliner asleep. She was somewhat difficult to arouse and would easily fall back asleep during exam which did limit overall examination. She does withdrawal to deep stimuli.  Skin: Skin is warm and dry.  Motor strength is 0/5 in the left deltoid, bicep, tricep, grip 3- at the left hip and knee extensor in a synergistic pattern.  Trace ankle dorsiflexor plantar flexor on the right And left upper extremity has antigravity movement although full effort is not obtained secondary to mental status Left lower extremity does have antigravity movement once again full effort difficult to assess Sensation intact to light touch bilateral lower limbs and left upper limb but Briefly opens eyes to command as well as physical stimulation No results found for this or any previous visit (from the past 24 hour(s)). Mr Brain Wo Contrast  Result Date: 05/25/2017 CLINICAL DATA:  Initial evaluation for acute left-sided weakness, found have right ICA occlusion with right MCA embolism, status post attempted catheter directed revascularization. EXAM: MRI HEAD WITHOUT CONTRAST TECHNIQUE: Multiplanar, multiecho pulse sequences of the brain and surrounding structures were obtained without intravenous contrast. COMPARISON:  Comparison made with prior CTs from earlier the same day. FINDINGS: Brain: Generalized age-related cerebral atrophy. Patchy and confluent T2/FLAIR hyperintensity within  the periventricular and deep white matter both cerebral hemispheres, most consistent with chronic small vessel ischemic disease, mild to moderate nature. Abnormal restricted diffusion involving the right caudate and lentiform nucleus, extending into the right subinsular white matter, consistent with acute ischemic infarct. Area of infarction measures 4.8 x 2.3 x 5.2 cm. Localized edema without significant mass effect. No associated hemorrhage. Additional patchy subcentimeter cortical and subcortical infarcts seen within the overlying right cerebral hemisphere with involvement of the right frontal parietal, and occipital lobes. No associated edema or mass effect. Single subcentimeter nonhemorrhagic punctate cortical infarct noted within the left parietal lobe as well (series 3, image 40). No other evidence for acute or subacute ischemia. Gray-white matter differentiation otherwise maintained. No mass lesion, midline shift or mass effect. No hydrocephalus. No extra-axial fluid collection. Pituitary gland suprasellar region normal. Midline structures intact and normal. Vascular: Abnormal flow void within the right ICA to the level of the terminus, consistent with known occlusion. Right M1 segment also appears occluded. Normal intravascular flow voids seen within the remainder of the major intracranial arterial vasculature at the skull base. Skull and upper cervical spine: Craniocervical junction normal. Upper cervical spine normal. Bone marrow signal intensity normal. No scalp soft tissue abnormality. Sinuses/Orbits: Globes oral soft tissues within normal limits. Patient status post cataract extraction bilaterally. Air-fluid level noted within the left maxillary sinus. Layering fluid noted within the nasopharynx. Patient likely intubated. Trace opacity noted within the right mastoid air cells. Inner ear structures normal. Other: None. IMPRESSION: 1. Acute ischemic right MCA territory infarct involving the right basal  ganglia, with additional scattered subcentimeter cortical subcortical infarcts within  the overlying right cerebral hemisphere. No associated hemorrhage or mass effect. 2. Single punctate nonhemorrhagic left parietal cortical infarct as above. 3. Abnormal flow voids within the right ICA and M1 segment, consistent with previously identified occlusion. 4. Age-related atrophy with mild to moderate chronic small vessel ischemic disease. Electronically Signed   By: Jeannine Boga M.D.   On: 05/25/2017 23:53   Dg Chest Port 1 View  Result Date: 05/25/2017 CLINICAL DATA:  Intubation, stroke EXAM: PORTABLE CHEST 1 VIEW COMPARISON:  Portable exam 1546 hours compared to 08/25/2016 FINDINGS: Tip of endotracheal tube projects 4.4 cm above carina. Nasogastric tube extends into stomach. Normal heart size, mediastinal contours, and pulmonary vascularity. Atherosclerotic calcifications aorta. Chronic bronchitic changes with mild RIGHT basilar atelectasis. No acute infiltrate, pleural effusion or pneumothorax. Bones demineralized with chronic RIGHT rotator cuff tear and a sclerotic lesion at the proximal LEFT humerus unchanged favor enchondroma less likely sequela of bone infarct. IMPRESSION: Satisfactory endotracheal tube position. Chronic bronchitic changes with minimal LEFT basilar atelectasis. Electronically Signed   By: Lavonia Dana M.D.   On: 05/25/2017 16:13   Ir Percutaneous Art Thrombectomy/infusion Intracranial Inc Diag Angio  Result Date: 05/27/2017 INDICATION: Right gaze preference. Left-sided hemiplegia. CT of the head and neck demonstrating complete occlusion of the right internal carotid artery proximally, and also extending into the supraclinoid right ICA at the right middle cerebral artery and the right anterior cerebral artery. EXAM: 1. EMERGENT LARGE VESSEL OCCLUSION THROMBOLYSIS (anterior CIRCULATION) COMPARISON:  CT angiogram of the head and neck of 05/25/2017. MEDICATIONS: Ancef 2 g IV antibiotic  was administered within 1 hour of the procedure. ANESTHESIA/SEDATION: General anesthesia. CONTRAST:  Isovue 300 approximately 80 cc. FLUOROSCOPY TIME:  Fluoroscopy Time: 46 minutes 48 seconds (1066 mGy). COMPLICATIONS: None immediate. TECHNIQUE: Following a full explanation of the procedure along with the potential associated complications, an informed witnessed consent was obtained from the patient's daughter. The risks of intracranial hemorrhage of 10%, worsening neurological deficit, ventilator dependency, death and inability to revascularize were all reviewed in detail with the patient's daughter. The patient was then put under general anesthesia by the Department of Anesthesiology at [facility]. The right groin was prepped and draped in the usual sterile fashion. Thereafter using modified Seldinger technique, transfemoral access into the right common femoral artery was obtained without difficulty. Over a 0.035 inch guidewire a 5 French Pinnacle sheath was inserted. Through this, and also over a 0.035 inch guidewire a 5 [place] JB 1 catheter, and Simmons 2 diagnostic catheter were advanced to the aortic arch region and selectively positioned in the innominate artery, the right common carotid artery and the left common carotid artery. FINDINGS: Extensive tortuosity with unfolding of the aortic arch associated with aortic calcification was encountered of the aortic arch. Acute angulation of the origins of the innominate artery and the left common carotid artery were noted. A selective innominate artery injection demonstrated the origin right subclavian artery and the right vertebral artery to be widely patent. The right common carotid artery bifurcation also demonstrated wide patency. A selective right common carotid arteriogram at its origin demonstrated complete angiographic occlusion of the right internal carotid artery. The right external carotid artery and its major branches appear to be widely  patent. More distally, no reconstitution of the internal carotid artery is noted from the right external carotid artery branches. The right vertebral artery origin is widely patent. The vessel is seen to ascend to the cranial skull base associated with mild-to-moderate tortuosity in the mid cervical  portions. Wide patency is seen of the right vertebrobasilar junction, and the right posterior-inferior cerebellar artery. The proximal basilar artery also demonstrates wide patency. The basilar artery, the superior cerebellar arteries and the anterior-inferior cerebellar arteries are seen to opacify into the capillary and venous phases. The left common carotid arteriogram demonstrates the left external carotid artery and its major branches to be widely patent. The left internal carotid artery at the bulb to the cranial skull base is widely patent. The petrous, cavernous and supraclinoid segments demonstrate wide patency. A left posterior communicating artery is seen opacifying the left posterior cerebral artery distribution. The left middle cerebral artery and the left anterior cerebral artery opacify into the capillary and venous phases. There is prompt opacification via the anterior communicating artery of the right anterior cerebral artery A2 segment, and the right A1 segment. The delayed arterial phase demonstrates significant revascularization from leptomeningeal collaterals of the right middle cerebral artery distribution to the level of the right middle cerebral artery bifurcation. PROCEDURE: Cannulation of the innominate artery was then obtained with a stiff Simmons 2 diagnostic catheter. However, further advancement into the right common carotid artery and distally was met with persistent herniation of the system and platform into the tortuous aortic arch. After multiple attempts using different guidewires, and also a 6 [place] and 5 [place] Simmons 2 shaped catheters, access was obtained into the left common  carotid artery. Again attempts made to advance this distally in order to obtain access and exchange for a 6 [place] or an 8 [place] system were met with herniation into the aortic arch. A final attempt was then made using a 6 French Brite tip [place] 2 guide catheter which could only be advanced to the mid level of the left common carotid artery. Through this, in a coaxial manner and with constant heparinized saline infusion using biplane roadmap technique and constant fluoroscopic guidance, a Via 027 microcatheter was then advanced over a 0.014 inch Softip Synchro micro guidewire to the distal end of the 6 [place] guide catheter. Thereafter, the combination was navigated without difficulty to the supraclinoid left ICA and wire was accessed into the right anterior cerebral artery A1 segment just proximal to the anterior communicating artery. However, further attempts to advance the microcatheter in this region again resulted in herniation of the platforms into the aortic arch. A final control arteriogram was then performed through the 6 [place] guide catheter in the left common carotid artery which again demonstrated robust filling via the anterior communicating artery of the right anterior cerebral artery A2 segment and distally, and the right anterior cerebral artery. The delayed arterial phase again demonstrated retrograde opacification of the right MCA distribution from the leptomeningeal collaterals to the level of the right MCA trifurcation. Consideration was then given to attempting revascularization of the right internal carotid artery and distally via the brachial approach, as well as a direct puncture of the right common carotid artery. However, given the age of the patient, and the robust collaterals as described above, and with the increased risk of a complication such as injury to the vessel, and also intracranial hemorrhage, it was elected to stop the procedure. Throughout the procedure, the patient's  blood pressure, heart rate and neurological status remained stable. There was no evidence of mass effect or of extravasation of contrast noted. The 6 French Simmons guide catheter was then retrieved into the abdominal aorta and into the 6 French 65 cm neurovascular Arrow sheath. The Arrow sheath was then exchanged for a 6  French Pinnacle sheath. The Pinnacle sheath was then removed with a successful hemostasis using an Angio-Seal closure device. At the end of the procedure, the patient's right groin appeared soft without evidence of hematoma. The distal pulses remained palpable in the dorsalis pedis, and the posterior tibial regions unchanged from the beginning of the procedure. IMPRESSION: Status post attempted endovascular revascularization of occluded right internal carotid artery, and right middle cerebral artery as described above. PLAN: Patient transferred to the neuro ICU for further management. Electronically Signed   By: Luanne Bras M.D.   On: 05/26/2017 10:32   Ir Angio Intra Extracran Sel Com Carotid Innominate Uni R Mod Sed  Result Date: 05/27/2017 INDICATION: Right gaze preference. Left-sided hemiplegia. CT of the head and neck demonstrating complete occlusion of the right internal carotid artery proximally, and also extending into the supraclinoid right ICA at the right middle cerebral artery and the right anterior cerebral artery. EXAM: 1. EMERGENT LARGE VESSEL OCCLUSION THROMBOLYSIS (anterior CIRCULATION) COMPARISON:  CT angiogram of the head and neck of 05/25/2017. MEDICATIONS: Ancef 2 g IV antibiotic was administered within 1 hour of the procedure. ANESTHESIA/SEDATION: General anesthesia. CONTRAST:  Isovue 300 approximately 80 cc. FLUOROSCOPY TIME:  Fluoroscopy Time: 46 minutes 48 seconds (1066 mGy). COMPLICATIONS: None immediate. TECHNIQUE: Following a full explanation of the procedure along with the potential associated complications, an informed witnessed consent was obtained from the  patient's daughter. The risks of intracranial hemorrhage of 10%, worsening neurological deficit, ventilator dependency, death and inability to revascularize were all reviewed in detail with the patient's daughter. The patient was then put under general anesthesia by the Department of Anesthesiology at [facility]. The right groin was prepped and draped in the usual sterile fashion. Thereafter using modified Seldinger technique, transfemoral access into the right common femoral artery was obtained without difficulty. Over a 0.035 inch guidewire a 5 French Pinnacle sheath was inserted. Through this, and also over a 0.035 inch guidewire a 5 [place] JB 1 catheter, and Simmons 2 diagnostic catheter were advanced to the aortic arch region and selectively positioned in the innominate artery, the right common carotid artery and the left common carotid artery. FINDINGS: Extensive tortuosity with unfolding of the aortic arch associated with aortic calcification was encountered of the aortic arch. Acute angulation of the origins of the innominate artery and the left common carotid artery were noted. A selective innominate artery injection demonstrated the origin right subclavian artery and the right vertebral artery to be widely patent. The right common carotid artery bifurcation also demonstrated wide patency. A selective right common carotid arteriogram at its origin demonstrated complete angiographic occlusion of the right internal carotid artery. The right external carotid artery and its major branches appear to be widely patent. More distally, no reconstitution of the internal carotid artery is noted from the right external carotid artery branches. The right vertebral artery origin is widely patent. The vessel is seen to ascend to the cranial skull base associated with mild-to-moderate tortuosity in the mid cervical portions. Wide patency is seen of the right vertebrobasilar junction, and the right  posterior-inferior cerebellar artery. The proximal basilar artery also demonstrates wide patency. The basilar artery, the superior cerebellar arteries and the anterior-inferior cerebellar arteries are seen to opacify into the capillary and venous phases. The left common carotid arteriogram demonstrates the left external carotid artery and its major branches to be widely patent. The left internal carotid artery at the bulb to the cranial skull base is widely patent. The petrous, cavernous  and supraclinoid segments demonstrate wide patency. A left posterior communicating artery is seen opacifying the left posterior cerebral artery distribution. The left middle cerebral artery and the left anterior cerebral artery opacify into the capillary and venous phases. There is prompt opacification via the anterior communicating artery of the right anterior cerebral artery A2 segment, and the right A1 segment. The delayed arterial phase demonstrates significant revascularization from leptomeningeal collaterals of the right middle cerebral artery distribution to the level of the right middle cerebral artery bifurcation. PROCEDURE: Cannulation of the innominate artery was then obtained with a stiff Simmons 2 diagnostic catheter. However, further advancement into the right common carotid artery and distally was met with persistent herniation of the system and platform into the tortuous aortic arch. After multiple attempts using different guidewires, and also a 6 [place] and 5 [place] Simmons 2 shaped catheters, access was obtained into the left common carotid artery. Again attempts made to advance this distally in order to obtain access and exchange for a 6 [place] or an 8 [place] system were met with herniation into the aortic arch. A final attempt was then made using a 6 French Brite tip [place] 2 guide catheter which could only be advanced to the mid level of the left common carotid artery. Through this, in a coaxial manner and with  constant heparinized saline infusion using biplane roadmap technique and constant fluoroscopic guidance, a Via 027 microcatheter was then advanced over a 0.014 inch Softip Synchro micro guidewire to the distal end of the 6 [place] guide catheter. Thereafter, the combination was navigated without difficulty to the supraclinoid left ICA and wire was accessed into the right anterior cerebral artery A1 segment just proximal to the anterior communicating artery. However, further attempts to advance the microcatheter in this region again resulted in herniation of the platforms into the aortic arch. A final control arteriogram was then performed through the 6 [place] guide catheter in the left common carotid artery which again demonstrated robust filling via the anterior communicating artery of the right anterior cerebral artery A2 segment and distally, and the right anterior cerebral artery. The delayed arterial phase again demonstrated retrograde opacification of the right MCA distribution from the leptomeningeal collaterals to the level of the right MCA trifurcation. Consideration was then given to attempting revascularization of the right internal carotid artery and distally via the brachial approach, as well as a direct puncture of the right common carotid artery. However, given the age of the patient, and the robust collaterals as described above, and with the increased risk of a complication such as injury to the vessel, and also intracranial hemorrhage, it was elected to stop the procedure. Throughout the procedure, the patient's blood pressure, heart rate and neurological status remained stable. There was no evidence of mass effect or of extravasation of contrast noted. The 6 French Simmons guide catheter was then retrieved into the abdominal aorta and into the 6 French 65 cm neurovascular Arrow sheath. The Arrow sheath was then exchanged for a 6 [place] Pinnacle sheath. The Pinnacle sheath was then removed with a  successful hemostasis using an Angio-Seal closure device. At the end of the procedure, the patient's right groin appeared soft without evidence of hematoma. The distal pulses remained palpable in the dorsalis pedis, and the posterior tibial regions unchanged from the beginning of the procedure. IMPRESSION: Status post attempted endovascular revascularization of occluded right internal carotid artery, and right middle cerebral artery as described above. PLAN: Patient transferred to the neuro ICU for further  management. Electronically Signed   By: Luanne Bras M.D.   On: 05/26/2017 10:32   Ir Angio Vertebral Sel Subclavian Innominate Uni R Mod Sed  Result Date: 05/27/2017 INDICATION: Right gaze preference. Left-sided hemiplegia. CT of the head and neck demonstrating complete occlusion of the right internal carotid artery proximally, and also extending into the supraclinoid right ICA at the right middle cerebral artery and the right anterior cerebral artery. EXAM: 1. EMERGENT LARGE VESSEL OCCLUSION THROMBOLYSIS (anterior CIRCULATION) COMPARISON:  CT angiogram of the head and neck of 05/25/2017. MEDICATIONS: Ancef 2 g IV antibiotic was administered within 1 hour of the procedure. ANESTHESIA/SEDATION: General anesthesia. CONTRAST:  Isovue 300 approximately 80 cc. FLUOROSCOPY TIME:  Fluoroscopy Time: 46 minutes 48 seconds (1066 mGy). COMPLICATIONS: None immediate. TECHNIQUE: Following a full explanation of the procedure along with the potential associated complications, an informed witnessed consent was obtained from the patient's daughter. The risks of intracranial hemorrhage of 10%, worsening neurological deficit, ventilator dependency, death and inability to revascularize were all reviewed in detail with the patient's daughter. The patient was then put under general anesthesia by the Department of Anesthesiology at [facility] The right groin was prepped and draped in the usual sterile fashion.  Thereafter using modified Seldinger technique, transfemoral access into the right common femoral artery was obtained without difficulty. Over a 0.035 inch guidewire a 5 French Pinnacle sheath was inserted. Through this, and also over a 0.035 inch guidewire a 5 [place] JB 1 catheter, and Simmons 2 diagnostic catheter were advanced to the aortic arch region and selectively positioned in the innominate artery, the right common carotid artery and the left common carotid artery. FINDINGS: Extensive tortuosity with unfolding of the aortic arch associated with aortic calcification was encountered of the aortic arch. Acute angulation of the origins of the innominate artery and the left common carotid artery were noted. A selective innominate artery injection demonstrated the origin right subclavian artery and the right vertebral artery to be widely patent. The right common carotid artery bifurcation also demonstrated wide patency. A selective right common carotid arteriogram at its origin demonstrated complete angiographic occlusion of the right internal carotid artery. The right external carotid artery and its major branches appear to be widely patent. More distally, no reconstitution of the internal carotid artery is noted from the right external carotid artery branches. The right vertebral artery origin is widely patent. The vessel is seen to ascend to the cranial skull base associated with mild-to-moderate tortuosity in the mid cervical portions. Wide patency is seen of the right vertebrobasilar junction, and the right posterior-inferior cerebellar artery. The proximal basilar artery also demonstrates wide patency. The basilar artery, the superior cerebellar arteries and the anterior-inferior cerebellar arteries are seen to opacify into the capillary and venous phases. The left common carotid arteriogram demonstrates the left external carotid artery and its major branches to be widely patent. The left internal carotid  artery at the bulb to the cranial skull base is widely patent. The petrous, cavernous and supraclinoid segments demonstrate wide patency. A left posterior communicating artery is seen opacifying the left posterior cerebral artery distribution. The left middle cerebral artery and the left anterior cerebral artery opacify into the capillary and venous phases. There is prompt opacification via the anterior communicating artery of the right anterior cerebral artery A2 segment, and the right A1 segment. The delayed arterial phase demonstrates significant revascularization from leptomeningeal collaterals of the right middle cerebral artery distribution to the level of the right middle cerebral artery  bifurcation. PROCEDURE: Cannulation of the innominate artery was then obtained with a stiff Simmons 2 diagnostic catheter. However, further advancement into the right common carotid artery and distally was met with persistent herniation of the system and platform into the tortuous aortic arch. After multiple attempts using different guidewires, and also a 6 [place] and 5 [place] Simmons 2 shaped catheters, access was obtained into the left common carotid artery. Again attempts made to advance this distally in order to obtain access and exchange for a 6 [place] or an 8 [place] system were met with herniation into the aortic arch. A final attempt was then made using a 6 French Brite tip [place] 2 guide catheter which could only be advanced to the mid level of the left common carotid artery. Through this, in a coaxial manner and with constant heparinized saline infusion using biplane roadmap technique and constant fluoroscopic guidance, a Via 027 microcatheter was then advanced over a 0.014 inch Softip Synchro micro guidewire to the distal end of the 6 [place] guide catheter. Thereafter, the combination was navigated without difficulty to the supraclinoid left ICA and wire was accessed into the right anterior cerebral artery A1  segment just proximal to the anterior communicating artery. However, further attempts to advance the microcatheter in this region again resulted in herniation of the platforms into the aortic arch. A final control arteriogram was then performed through the 6 [place] guide catheter in the left common carotid artery which again demonstrated robust filling via the anterior communicating artery of the right anterior cerebral artery A2 segment and distally, and the right anterior cerebral artery. The delayed arterial phase again demonstrated retrograde opacification of the right MCA distribution from the leptomeningeal collaterals to the level of the right MCA trifurcation. Consideration was then given to attempting revascularization of the right internal carotid artery and distally via the brachial approach, as well as a direct puncture of the right common carotid artery. However, given the age of the patient, and the robust collaterals as described above, and with the increased risk of a complication such as injury to the vessel, and also intracranial hemorrhage, it was elected to stop the procedure. Throughout the procedure, the patient's blood pressure, heart rate and neurological status remained stable. There was no evidence of mass effect or of extravasation of contrast noted. The 6 French Simmons guide catheter was then retrieved into the abdominal aorta and into the 6 French 65 cm neurovascular Arrow sheath. The Arrow sheath was then exchanged for a 6 [place] Pinnacle sheath. The Pinnacle sheath was then removed with a successful hemostasis using an Angio-Seal closure device. At the end of the procedure, the patient's right groin appeared soft without evidence of hematoma. The distal pulses remained palpable in the dorsalis pedis, and the posterior tibial regions unchanged from the beginning of the procedure. IMPRESSION: Status post attempted endovascular revascularization of occluded right internal carotid artery,  and right middle cerebral artery as described above. PLAN: Patient transferred to the neuro ICU for further management. Electronically Signed   By: Luanne Bras M.D.   On: 05/26/2017 10:32    Assessment/Plan: Diagnosis: Right MCA infarct with left hemiparesis and left neglect, right gaze preference and left upper extremity sensory deficits and dysphagia 1. Does the need for close, 24 hr/day medical supervision in concert with the patient's rehab needs make it unreasonable for this patient to be served in a less intensive setting? Potentially 2. Co-Morbidities requiring supervision/potential complications: Atrial fibrillation 3. Due to bladder management, bowel management,  safety, skin/wound care, disease management, medication administration, pain management and patient education, does the patient require 24 hr/day rehab nursing? Potentially 4. Does the patient require coordinated care of a physician, rehab nurse, PT, OT, speech to address physical and functional deficits in the context of the above medical diagnosis(es)? No Addressing deficits in the following areas: balance, endurance, locomotion, strength, transferring, bowel/bladder control, bathing, dressing, feeding, grooming, toileting, cognition, swallowing and psychosocial support 5. Can the patient actively participate in an intensive therapy program of at least 3 hrs of therapy per day at least 5 days per week? No 6. The potential for patient to make measurable gains while on inpatient rehab is poor 7. Anticipated functional outcomes upon discharge from inpatient rehab are n/a  with PT, n/a with OT, n/a with SLP. 8. Estimated rehab length of stay to reach the above functional goals is: Not applicable 9. Anticipated D/C setting: SNF 10. Anticipated post D/C treatments: SNF 11. Overall Rehab/Functional Prognosis: fair  RECOMMENDATIONS: This patient's condition is appropriate for continued rehabilitative care in the following  setting: SNF Patient has agreed to participate in recommended program. N/A Note that insurance prior authorization may be required for reimbursement for recommended care.  Comment:   Charlett Blake M.D. [facility] FAAPM&R (Sports Med, Neuromuscular Med) Diplomate Am Board of Electrodiagnostic Med  Elizabeth Sauer 05/27/2017

## 2017-05-27 NOTE — Evaluation (Signed)
Physical Therapy Evaluation Patient Details Name: Chloe Lopez MRN: [id number] DOB: [birth year] Today's Date: 05/27/2017   History of Present Illness  84 yo female with chronic atrial fibrillation and neuropathy who was found by daughter with left hemiplegia. MRI showing Acute ischemic right MCA territory infarct involving the right. Unsuccessful revascularization. Pt intubated 1/22-1/23  Clinical Impression  Pt lethargic with cues needed to arouse, attend and follow commands. On command to reach for LUE pt using RUE to reach toward left but would not look to find arm or move RUE fully to left to find LUE and needed assist to reach LUE. Pt able to follow simple commands grossly 50% of the time with impaired strength, balance, cognition, awareness and function who will benefit from acute therapy to maximize mobility, balance and safety to decrease burden of care.      Follow Up Recommendations CIR;Supervision/Assistance - 24 hour    Equipment Recommendations  Wheelchair (measurements PT);3in1 (PT);Wheelchair cushion (measurements PT);Hospital bed    Recommendations for Other Services       Precautions / Restrictions Precautions Precautions: Fall Precaution Comments: left hemiplegia, left inattention, very sensitive skin bil LE      Mobility  Bed Mobility Overal bed mobility: Needs Assistance Bed Mobility: Rolling;Sidelying to Sit Rolling: Mod assist Sidelying to sit: Max assist       General bed mobility comments: cues for sequence to roll left and rise from side to sitting. pt with left lean in sitting and able to achieve midline after reaching for rail to right x2 and propping on right forearm x 2  Transfers Overall transfer level: Needs assistance   Transfers: Squat Pivot Transfers     Squat pivot transfers: Max assist;+2 physical assistance;+2 safety/equipment     General transfer comment: max assist with pad and belt to squat pivot to pt's left with left knee  blocked, cues for positioning and sequence with physical assist for anterior translation and rotation to chair  Ambulation/Gait             General Gait Details: unable  Stairs            Wheelchair Mobility    Modified Rankin (Stroke Patients Only) Modified Rankin (Stroke Patients Only) Pre-Morbid Rankin Score: No symptoms Modified Rankin: Severe disability     Balance Overall balance assessment: Needs assistance Sitting-balance support: Single extremity supported;Feet supported Sitting balance-Leahy Scale: Poor Sitting balance - Comments: propping on RUE and back to sitting pt able to sit EOB after x 1 min with minguard assist. Initial sitting max assist due to anterior left lean Postural control: Left lateral lean                                   Pertinent Vitals/Pain Faces Pain Scale: Hurts little more Pain Location: bil LE to touch Pain Descriptors / Indicators: Sore Pain Intervention(s): Limited activity within patient's tolerance;Repositioned;Monitored during session    Home Living Family/patient expects to be discharged to:: Private residence Living Arrangements: Alone Available Help at Discharge: Family;Available PRN/intermittently(unsure if they can provide 24hrs yet) Type of Home: Apartment Home Access: Stairs to enter Entrance Stairs-Rails: Can reach both Entrance Stairs-Number of Steps: 2  Home Layout: One level Home Equipment: None;Cane - single point      Prior Function Level of Independence: Independent         Comments: enjoys gardening and reading     Hand Dominance  Dominant Hand: Right    Extremity/Trunk Assessment   Upper Extremity Assessment Upper Extremity Assessment: Defer to OT evaluation    Lower Extremity Assessment Lower Extremity Assessment: LLE deficits/detail LLE Deficits / Details: pt with 1/5 quad and hamstring activation LLE Sensation: decreased light touch    Cervical / Trunk  Assessment Cervical / Trunk Assessment: Other exceptions Cervical / Trunk Exceptions: pt maintains right neck rotation with lateral flexion, rounded shoulders  Communication      Cognition Arousal/Alertness: Lethargic Behavior During Therapy: Flat affect Overall Cognitive Status: Impaired/Different from baseline Area of Impairment: Orientation;Attention;Memory;Following commands;Safety/judgement                 Orientation Level: Disoriented to;Time;Situation;Place Current Attention Level: Focused Memory: Decreased short-term memory Following Commands: Follows one step commands inconsistently;Follows one step commands with increased time Safety/Judgement: Decreased awareness of deficits;Decreased awareness of safety     General Comments: pt stating she is at home, aware of daughter in room, needs increased time for commands and constant cues to open eyes      General Comments      Exercises     Assessment/Plan    PT Assessment Patient needs continued PT services  PT Problem List Decreased strength;Decreased mobility;Decreased safety awareness;Decreased range of motion;Decreased coordination;Decreased activity tolerance;Decreased cognition;Decreased balance;Decreased knowledge of use of DME;Impaired sensation;Pain       PT Treatment Interventions DME instruction;Therapeutic activities;Cognitive remediation;Therapeutic exercise;Patient/family education;Balance training;Functional mobility training;Neuromuscular re-education;Wheelchair mobility training    PT Goals (Current goals can be found in the Care Plan section)  Acute Rehab PT Goals Patient Stated Goal: family would like her to return to functional activities PT Goal Formulation: With family Time For Goal Achievement: 06/10/17 Potential to Achieve Goals: Fair    Frequency Min 3X/week   Barriers to discharge Decreased caregiver support      Co-evaluation               AM-PAC PT "6 Clicks" Daily  Activity  Outcome Measure Difficulty turning over in bed (including adjusting bedclothes, sheets and blankets)?: Unable Difficulty moving from lying on back to sitting on the side of the bed? : Unable Difficulty sitting down on and standing up from a chair with arms (e.g., wheelchair, bedside commode, etc,.)?: Unable Help needed moving to and from a bed to chair (including a wheelchair)?: A Lot Help needed walking in hospital room?: Total Help needed climbing 3-5 steps with a railing? : Total 6 Click Score: 7    End of Session Equipment Utilized During Treatment: Gait belt Activity Tolerance: Patient tolerated treatment well Patient left: in chair;with call bell/phone within reach;with chair alarm set;with family/visitor present Nurse Communication: Mobility status;Need for lift equipment PT Visit Diagnosis: Other abnormalities of gait and mobility (R26.89);Unsteadiness on feet (R26.81);Hemiplegia and hemiparesis Hemiplegia - Right/Left: Right Hemiplegia - dominant/non-dominant: Dominant Hemiplegia - caused by: Cerebral infarction    Time: 1914-7829 PT Time Calculation (min) (ACUTE ONLY): 25 min   Charges:   PT Evaluation $PT Eval Moderate Complexity: 1 Mod PT Treatments $Therapeutic Activity: 8-22 mins   PT G Codes:        Elwyn Reach, PT [phone]   [place] 05/27/2017, 10:40 AM

## 2017-05-27 NOTE — Progress Notes (Signed)
Initial Nutrition Assessment  INTERVENTION:   If diet advanced will supplement as appropriate to meet needs  If unable to pass swallow eval and TF desired recommend: Jevity 1.2 @ 45 ml/hr (1080 ml/day) with 30 ml Prostat daily  Provides: 1396 kcal, 75 grams protein, and 875 ml free water.    NUTRITION DIAGNOSIS:   Inadequate oral intake related to inability to eat as evidenced by NPO status.  GOAL:   Patient will meet greater than or equal to 90% of their needs  MONITOR:   Diet advancement, I & O's  REASON FOR ASSESSMENT:   Malnutrition Screening Tool    ASSESSMENT:   Pt with PMH of depression, chronic afib who lives alone and was admitted with occluded L carotid which could not be recannulated, pt with L hemiplegia.    Pt discussed during ICU rounds and with RN.  CIR eval pending  Swallow eval pending.  Spoke with daughter, Elzie Rings, who reports that pt lives alone and was very independent prior to this event. She does her own cooking and has a great appetite. She also reports no recent weight changes PTA.  Per chart review pt's weight is stable around 150 lb.   Medications reviewed and include: prednisone, neosynephrine Labs reviewed: K+ 2.9 (L)    NUTRITION - FOCUSED PHYSICAL EXAM:  Deferred, pt sleeping and daughter did not want to wake  Diet Order:  Fall precautions Diet NPO time specified  EDUCATION NEEDS:   No education needs have been identified at this time  Skin:  Skin Assessment: Reviewed RN Assessment  Last BM:  smear today  Height:   Ht Readings from Last 1 Encounters:  05/25/17 5\' 5"  (1.651 m)    Weight:   Wt Readings from Last 1 Encounters:  05/25/17 151 lb 14.4 oz (68.9 kg)    Ideal Body Weight:  56.8 kg  BMI:  Body mass index is 25.28 kg/m.  Estimated Nutritional Needs:   Kcal:  1350-1500  Protein:  70-85 grams  Fluid:  > 1.5 L/day  Maylon Peppers RD, LDN, CNSC [phone] Pager [phone] After Hours Pager

## 2017-05-27 NOTE — Progress Notes (Signed)
PULMONARY / CRITICAL CARE MEDICINE   Name: Chloe Lopez MRN: [id number] DOB: 03/04/1924    ADMISSION DATE:  05/25/2017   CHIEF COMPLAINT:  Left hemiplegia  HISTORY OF PRESENT ILLNESS:        This is a 84 year old who was last known to be normal at 7 PM who was found by her daughter approximately 1030 the next morning with a left hemiplegia.  She was brought to our department of emergency medicine where CT was remarkable only for a dense right MCA sign.  A CT angiogram showed complete occlusion of the right internal carotid.  The patient was intubated for deep sedation for interventional radiology after she was found to have a very large penumbra in excess of 130 cc on the perfusion scan.  The right internal carotid could not be recannulated.  She arrived in the intensive care unit intubated on 1/22.  We were unable to extubate the patient due to excessive sedation.        She was extubated on 1/23 and this is been well-tolerated.  She is continuing to requiring a modest dose of Neo-Synephrine to support her blood pressure.  She is not verbal for me this morning, but does nod appropriately to questions and follows simple instructions.   PAST MEDICAL HISTORY :  She  has a past medical history of Angioedema, Depression, Dysphagia, pharyngoesophageal phase (07/07/2012), Left hip pain (07/22/2011), Leg swelling (11/02/2012), Sciatica (08/21/2011), Sciatica (04/19/2013), Sweating (01/21/2012), and Urticaria.  PAST SURGICAL HISTORY: She  has a past surgical history that includes Mastectomy, partial (2009); Abdominal hysterectomy; and Radiology with anesthesia (N/A, 05/25/2017).  Allergies  Allergen Reactions  . Azithromycin Swelling    Reportedly had reaction September 2017 - caused worsening angioedema. Unclear if it was this or the benzonatate she started at the same time.  . Benzonatate Swelling    Reportedly had reaction September 2017 - caused worsening angioedema. Unclear if it was this or the  azithromycin she started at the same time.    No current facility-administered medications on file prior to encounter.    Current Outpatient Medications on File Prior to Encounter  Medication Sig  . CARTIA XT 180 MG 24 hr capsule TAKE ONE CAPSULE BY MOUTH DAILY  . doxepin (SINEQUAN) 10 MG capsule Take 3 capsules (30 mg total) by mouth at bedtime. (Patient taking differently: Take 20-30 mg by mouth at bedtime. )  . EPINEPHrine (EPIPEN 2-PAK) 0.3 mg/0.3 mL IJ SOAJ injection Inject 0.3 mg into the muscle once. Reported on 08/30/2015  . predniSONE (DELTASONE) 10 MG tablet TAKE AS DIRECTED. ALTERNATE 10 MG ONE DAY THEN 5 MG THE NEXT. (Patient taking differently: Take 5-10 mg by mouth See admin instructions. Alternate every over day with 5mg  and 10mg .)  . ranitidine (ZANTAC) 150 MG tablet Take one tablet twice a day. (Patient taking differently: Take 150 mg by mouth daily as needed for heartburn. )  . traMADol (ULTRAM) 50 MG tablet Take 1 tablet (50 mg total) daily as needed by mouth (pain in legs).  . [DISCONTINUED] DOXEPIN HCL PO Take by mouth. 30 mg  Twice a day    FAMILY HISTORY:  Her indicated that her mother is deceased. She indicated that her father is deceased. She indicated that only one of her two sisters is alive. She indicated that her brother is deceased.   SOCIAL HISTORY: She  reports that she quit smoking about 21 years ago. she has never used smokeless tobacco. She reports that she drinks about  1.2 oz of alcohol per week. She reports that she does not use drugs.  REVIEW OF SYSTEMS:   Not obtainable  SUBJECTIVE:  As above  VITAL SIGNS: BP (!) 156/85   Pulse 97   Temp 99 F (37.2 C) (Oral)   Resp (!) 24   Ht 5\' 5"  (1.651 m)   Wt 151 lb 14.4 oz (68.9 kg)   SpO2 98%   BMI 25.28 kg/m   HEMODYNAMICS:    VENTILATOR SETTINGS: Vent Mode: PSV;CPAP FiO2 (%):  [30 %] 30 % PEEP:  [5 cmH20] 5 cmH20 Pressure Support:  [5 cmH20] 5 cmH20 Plateau Pressure:  [11 cmH20] 11  cmH20  INTAKE / OUTPUT: I/O last 3 completed shifts: In: 5544.7 [I.V.:5144.7; IV Piggyback:400] Out: 2425 [Urine:2425]  PHYSICAL EXAMINATION: General: Elderly white female resting comfortably in bed and in no acute distress.   Neuro: She does not open eyes to voice for me but she does not appropriately to questions.  She moves the right extremities on request not the left.  Pupils are equal. Cardiovascular: S1 and S2 are irregularly irregular  without murmur rub or gallop Lungs: Respirations are unlabored, there is symmetric air movement, there are scattered rhonchi more prominent on the left and on the right.  There are no wheezes. Abdomen: The abdomen is soft without any organomegaly masses or tenderness. Musculoskeletal: There is trace lower extremity edema.  LABS:  BMET Recent Labs  Lab 05/25/17 1118 05/25/17 1124 05/26/17 0730  NA 139 140 138  K 3.9 3.7 2.9*  CL 102 101 109  CO2 23  --  19*  BUN 11 12 7   CREATININE 0.74 0.60 0.60  GLUCOSE 118* 119* 110*    Electrolytes Recent Labs  Lab 05/25/17 1118 05/26/17 0730  CALCIUM 9.0 8.1*    CBC Recent Labs  Lab 05/25/17 1118 05/25/17 1124  WBC 10.6*  --   HGB 15.7* 16.0*  HCT 46.6* 47.0*  PLT 276  --     Coag's Recent Labs  Lab 05/25/17 1118  APTT 27  INR 1.03    Sepsis Markers No results for input(s): LATICACIDVEN, PROCALCITON, O2SATVEN in the last 168 hours.  ABG Recent Labs  Lab 05/25/17 1654  PHART 7.366  PCO2ART 38.5  PO2ART 145.0*    Liver Enzymes Recent Labs  Lab 05/25/17 1118  AST 20  ALT 11*  ALKPHOS 64  BILITOT 0.7  ALBUMIN 3.3*    Cardiac Enzymes No results for input(s): TROPONINI, PROBNP in the last 168 hours.  Glucose Recent Labs  Lab 05/25/17 1118  GLUCAP 109*    Imaging No results found.   STUDIES:  MRI last night showed an infarct involving the right caudate and patchy right subcortical infarcts on the right.  DISCUSSION:       This is a 84 year old with  chronic atrial fibrillation who presented with left hemiplegia.  She was found to have a occluded left carotid which could not be recannulated.  She had a very large penumbra on perfusion study.  Remarkably she has not evolved an extremely large infarct on the follow-up MRI.  ASSESSMENT / PLAN:  PULMONARY Extubation has been well-tolerated.  She has asymmetric rhonchi on examination today, I will discussed with family I would aggressively they wish me to pursue such findings.  CARDIOVASCULAR A: She is currently on Neo-Synephrine targeting a systolic pressure between 140 and 160 in the hopes of maintaining collateral flow.  Yesterday her level of consciousness seemed to be somewhat dependent on her  blood pressure.  She is in chronic atrial fibrillation and for now we are using IV Lopressor for rate control, as we have no enteral route to administer her usual calcium channel blocker.   I will discussed the introduction of a full dose anticoagulant with neurology, and after obtaining input from family as to whether or not they would wishes to place a feeding tube for administration of enteral medications.    GASTROINTESTINAL A: On Pepcid for GI prophylaxis  HEMATOLOGIC A: On subcutaneous heparin for DVT prophylaxis   NEUROLOGIC A: As noted she has suffered from a right basal ganglia infarct with multiple patchy infarcts in the subcortical region of the right hemisphere.  She continues on aspirin and a statin.  This is in the setting of chronic atrial fibrillation and I will discuss with neurology the introduction of full dose anticoagulation at some point.  We have asked family to consider what supportive care the patient would desire.  I will discuss this with them before initiating new therapies today.   Lars Masson, MD Pulmonary and [place] Pager: [phone]  05/27/2017, 7:23 AM

## 2017-05-27 NOTE — Progress Notes (Signed)
NEUROHOSPITALISTS STROKE TEAM - DAILY PROGRESS NOTE   ADMISSION HISTORY:  Chloe Lopez is a 84 y.o. female who has a past medical history of atrial fibrillation not on anticoagulation, peripheral neuropathy who is functionally independent at baseline, lives at home, who was in her usual state of health to the best of family's knowledge at 7 PM last night when the daughter spoke with her, and found this morning to be weak on the left side and gazing to the right.  It is unclear if she was seen anytime after 7 PM until being found this morning with left-sided weakness.  The daughter attempted to contact other family members who might have been in touch with her, but no information was available. The daughter says the patient could not move her left arm or leg, was getting to the right had left-sided facial weakness and droop along with slurred speech.  EMS was called.  They also noted left hemiplegia, right gaze preference and left facial weakness along with dysarthria.   She was brought into the emergency room.  Taken in for a noncontrast CT of the head that did not reveal any acute bleed but showed a hyperdense right MCA.  She was outside the window for IV TPA hence did not get TPA.  CT angiogram of the head and neck and CT perfusion study was done that showed right ICA occlusion at the bifurcation and a CT perfusion study that showed to 8 cc core and 132 cc area at risk.  Risks benefits of neuro intervention were discussed with the daughter and she was taken in for diagnostic cerebral angiogram and possible thrombectomy.  LKW: 1900 hrs. on 05/25/2017 tpa given?: no, outside the window Premorbid modified Rankin scale (mRS): 0  NIHSS 1a Level of Conscious.: 0 1b LOC Questions: 0 1c LOC Commands: 0 2 Best Gaze: 1 3 Visual: 2 4 Facial Palsy: 2 5a Motor Arm - left: 4 5b Motor Arm - Right: 0 6a Motor Leg - Left: 3 6b Motor Leg - Right: 0 7 Limb  Ataxia: 0 8 Sensory: 2 9 Best Language: 1 10 Dysarthria: 2 11 Extinct. and Inatten.: 2 TOTAL: 19  SUBJECTIVE (INTERVAL HISTORY) Two daughters are at the bedside today. Patient is found laying in bed in NAD, remains sedated and intubated. No change in neuro exam. No new/ acute events reported overnight.BP adequately controlled Family to decide code status later this evening once daughter from [place] arrives. POC reviewed and CCM to attempt extubation later today.   OBJECTIVE Lab Results: CBC:  Recent Labs  Lab 05/25/17 1118 05/25/17 1124  WBC 10.6*  --   HGB 15.7* 16.0*  HCT 46.6* 47.0*  MCV 95.9  --   PLT 276  --    BMP: Recent Labs  Lab 05/25/17 1118 05/25/17 1124 05/26/17 0730  NA 139 140 138  K 3.9 3.7 2.9*  CL 102 101 109  CO2 23  --  19*  GLUCOSE 118* 119* 110*  BUN 11 12 7   CREATININE 0.74 0.60 0.60  CALCIUM 9.0  --  8.1*   Liver Function Tests:  Recent Labs  Lab 05/25/17 1118  AST 20  ALT 11*  ALKPHOS 64  BILITOT 0.7  PROT 6.0*  ALBUMIN 3.3*   Coagulation Studies:  Recent Labs    05/25/17 1118  APTT 27  INR 1.03   PHYSICAL EXAM Temp:  [98.2 F (36.8 C)-100.2 F (37.9 C)] 100.2 F (37.9 C) (01/24 0800) Pulse Rate:  [37-113]  95 (01/24 1115) Resp:  [16-31] 23 (01/24 1115) BP: (83-198)/(58-183) 138/67 (01/24 1115) SpO2:  [90 %-99 %] 96 % (01/24 1115) Arterial Line BP: (89-168)/(59-97) 142/70 (01/24 1115) General - Well nourished, well developed, in no apparent distress HEENT-  Normocephalic,    Cardiovascular - Irregular rate and rhythm  Respiratory - Lungs clear bilaterally. No wheezing. Abdomen - soft and non-tender, BS normal Extremities- no edema or cyanosis Neurological exam- remains sedated and intubated Patient is somnolent but will briefly open eyes, Follows some simple commands No Speech output Cranial nerves: Pupils equal round reactive to light, right gaze preference able to cross midline to look at the left, left homonymous  hemianopsia, left lower facial weakness, decreased sensation in the left side of the face. Motor exam: 0/5 left upper extremity, 1/5 left lower extremity, 5/5 right upper and right lower extremities.+ withdrawal to pain on left Sensory exam: Dense loss with neglect on the left side. Coordination: cannot performed due to sedation Gait exam - not performed  IMAGING: I have personally reviewed the radiological images below and agree with the radiology interpretations. Ct Angio Head/Neck W Or Wo Contrast Result Date: 05/25/2017 IMPRESSION: 1. The right internal carotid artery is occluded at the carotid bifurcation. 2. The fetal type right posterior cerebral artery fills via a small right P1 segment to the right ICA terminus. 3. Right M1 occlusion with reconstitution of right MCA branch vessels beyond the bifurcation suggesting collateral flow. 4. Right A1 segment flow is likely retrograde. 5. No significant left-sided stenosis. 6. Aortic Atherosclerosis (ICD10-I70.0). No significant stenosis or aneurysm at the aortic arch. 7. Multilevel spondylosis of the cervical spine. These results were called by telephone at the time of interpretation on 05/25/2017 at 11:58 Am to Dr. Amie Portland , who verbally acknowledged these results. Electronically Signed   By: San Morelle M.D.   On: 05/25/2017 12:07   Mr Brain Wo Contrast Result Date: 05/25/2017  IMPRESSION: 1. Acute ischemic right MCA territory infarct involving the right basal ganglia, with additional scattered subcentimeter cortical subcortical infarcts within the overlying right cerebral hemisphere. No associated hemorrhage or mass effect. 2. Single punctate nonhemorrhagic left parietal cortical infarct as above. 3. Abnormal flow voids within the right ICA and M1 segment, consistent with previously identified occlusion. 4. Age-related atrophy with mild to moderate chronic small vessel ischemic disease. Electronically Signed   By: Jeannine Boga  M.D.   On: 05/25/2017 23:53   Ct Cerebral Perfusion W Contrast Result Date: 05/25/2017 IMPRESSION: 1. Large area of ischemic penumbra involving the right MCA territory with estimated volume of 130 mL and small core infarct involving the basal ganglia. The estimated core volume is 8 mm. 2. The right MCA ischemia corresponds to right ICA and M1 occlusions. Electronically Signed   By: San Morelle M.D.   On: 05/25/2017 12:09   Dg Chest Port 1 View Result Date: 05/25/2017 IMPRESSION: Satisfactory endotracheal tube position. Chronic bronchitic changes with minimal LEFT basilar atelectasis. Electronically Signed   By: Lavonia Dana M.D.   On: 05/25/2017 16:13   Ct Head Code Stroke Wo Contrast Addendum Date: 05/25/2017   Result Date: 05/25/2017 IMPRESSION: 1. Hyperdense right MCA compatible with thrombosis. No acute infarct or hemorrhage 2. ASPECTS is 10 I have paged the stroke neurologist. Electronically Signed: By: Franchot Gallo M.D. On: 05/25/2017 11:35   Echocardiogram:  PENDING    IMPRESSION: Chloe Lopez is a 84 y.o. female with PMH of atrial fibrillation not on anticoagulation, peripheral neuropathy functionally dependent at baseline lives at home in her usual state of health last known normal at 7 PM last night was found to have left-sided weakness and right gaze preference along with dysarthria and left facial weakness. NIH stroke scale 19 for a right MCA syndrome.  Noncontrast CT of the head shows no bleed.  CT aspects 728.  CT angiogram head and neck showed right ICA occlusion and right MCA occlusion in the M1 segment.  CT perfusion profile favorable for emergent intervention because of a small 8 cc core and large ischemic penumbra 130 cc.  Not a candidate for TPA because of outside the window.  Interventional team contacted, case discussed and patient going for diagnostic cerebral angiogram and possible thrombectomy. .  Acute ischemic  right MCA territory infarct -Right basal ganglia, with additional scattered subcentimeter cortical subcortical infarcts within the overlying right cerebral hemisphere Occlusion and stenosis of R carotid artery  Suspected Etiology: cardioembolic source, AFIB not on AC therapy Resultant Symptoms: Left sided defcits Stroke Risk Factors: atrial fibrillation and hypertension Other Stroke Risk Factors: Advanced age,   PROCEDURES:  05/25/2017 by Dr Estanislado Pandy s/p revascularization attempt, unable to achieve reperfusion.  Outstanding Stroke Work-up Studies:     Echocardiogram:                                                    PENDING  05/27/2017 ASSESSMENT:   Patient remains intubated and sedated. Following simple commands. Left hemiplegia unchanged. Plan for extubation later today. Family to make decisions on code status later this evening. Reports of AFIB with RVR overnight. Started Cardizem for rate control. Restarted home meds regime for Angioedema.  PLAN  05/27/2017: Continue Aspirin/ Statin Attempt extubation Frequent neuro checks Telemetry monitoring PT/OT/SLP Consult PM & Rehab Consult Case Management /MSW Ongoing aggressive stroke risk factor management Patient's family will be counseled to be compliant with her antithrombotic medications Patient's family will be counseled on Lifestyle modifications including, Diet, Exercise, and Stress Follow up with [facility] in 6 weeks  DYSPHAGIA: NPO until passes SLP swallow evaluation Aspiration Precautions in progress  MEDICAL ISSUES: ACUTE RESPIRATORY FAILURE: [place] Management per CCM team - Appreciate assistance Extubated today  AFIB, CHRONIC: Cardizem started today for rate control Will likely need AC therapy prior to discharge   HEME Hemoglobin 16 -Monitor -transfuse for hgb < 7 -Trend PT/PTT/INR  Fluid/Electrolyte Disorders-  Hypokalemia Check labs Replete per ICU protocol  ID Possible Aspiration  PNA -CXR -NPO -Monitor  R/O UTI: Cultures pending  HYPERTENSION: Stable SBP goal less than 180/90  Nicardipine drip, Labetolol PRN Long term BP goal normotensive. May slowly restart home B/P medications after 48 hours Home Meds: Cartia XT  HYPERLIPIDEMIA:    Component Value Date/Time   CHOL 141 05/26/2017 0414   TRIG 62 05/26/2017 0414   HDL 55 05/26/2017 0414   CHOLHDL 2.6 05/26/2017 0414   VLDL 12 05/26/2017 0414   LDLCALC 74 05/26/2017 0414  Home Meds:  NONE LDL  goal < 70 Started on Lipitor to 10 mg daily Continue statin at discharge  R/O DIABETES: Lab Results  Component Value Date   HGBA1C 5.5 05/26/2017  HgbA1c goal < 7.0 Continue CBG monitoring  and SSI to maintain glucose 140-180 mg/dl  Other Active Problems: Active Problems:   Stroke (cerebrum) (HCC)   Middle cerebral artery embolism, right    Hospital day # 2 VTE prophylaxis: Heparin Diet : Fall precautions Diet NPO time specified   FAMILY UPDATES: family at bedside  TEAM UPDATES: Garvin Fila, MD STATUS:  FULL   Prior Home Stroke Medications:  No antithrombotic  Discharge Stroke Meds:  Please discharge patient on TBD   Disposition: 01-Home or Self Care Therapy Recs:               CIR Home Equipment:         PENDING Follow Up:  Follow-up Information    Garvin Fila, MD. Schedule an appointment as soon as possible for a visit in 6 week(s).   Specialties:  Neurology, Radiology Contact information: [address] [place] [address] [phone]          Leeanne Rio, MD -PCP Follow up in 1-2 weeks     Assessment & plan discussed with with attending physician and they are in agreement.    Mary Sella, ANP-C Stroke Neurology Team 05/27/2017 12:26 PM I have personally examined this patient, reviewed notes, independently viewed imaging studies, participated in medical decision making and plan of care.ROS completed by me personally and pertinent positives  fully documented  I have made any additions or clarifications directly to the above note. Agree with note above.  She presented with right hemispheric infarct due to right M1 occlusion but likely has underlying chronic right ICA occlusion and hence could not undergo successful revascularization.  Plan continue strict blood pressure control and extubate today if tolerated.  Spoke to 2 daughters at the bedside and they are willing to discuss [place] with her other daughter who arrives this evening.  Start aspirin for stroke prevention.  Discussed with Dr. Pearline Cables. This patient is critically ill and at significant risk of neurological worsening, death and care requires constant monitoring of vital signs, hemodynamics,respiratory and cardiac monitoring, extensive review of multiple databases, frequent neurological assessment, discussion with family, other specialists and medical decision making of high complexity.I have made any additions or clarifications directly to the above note.This critical care time does not reflect procedure time, or teaching time or supervisory time of PA/NP/Med Resident etc but could involve care discussion time.  I spent 35 minutes of neurocritical care time  in the care of  this patient.      Antony Contras, MD Medical Director [place] Pager: [phone] 05/27/2017 12:26 PM  To contact Stroke Continuity provider, please refer to [email or web address] After hours, contact General Neurology

## 2017-05-27 NOTE — Progress Notes (Signed)
Rehab Admissions Coordinator Note:  Patient was screened by Retta Diones for appropriateness for an Inpatient Acute Rehab Consult.  At this time, we are recommending Inpatient Rehab consult.  Jodell Cipro M 05/27/2017, 1:13 PM  I can be reached at [phone].

## 2017-05-27 NOTE — Evaluation (Signed)
Clinical/Bedside Swallow Evaluation Patient Details  Name: Chloe Lopez MRN: [id number] Date of Birth: 15-Jul-1923  Today's Date: 05/27/2017 Time: SLP Start Time (ACUTE ONLY): 1502 SLP Stop Time (ACUTE ONLY): 1525 SLP Time Calculation (min) (ACUTE ONLY): 23 min  Past Medical History:  Past Medical History:  Diagnosis Date  . Angioedema   . Depression   . Dysphagia, pharyngoesophageal phase 07/07/2012  . Left hip pain 07/22/2011  . Leg swelling 11/02/2012  . Sciatica 08/21/2011   Started recently in left hip. Takes tramadol. Does not want surgery. Happens once a week or so.    . Sciatica 04/19/2013  . Sweating 01/21/2012   Might relate to Doxepin   . Urticaria    Past Surgical History:  Past Surgical History:  Procedure Laterality Date  . ABDOMINAL HYSTERECTOMY    . IR ANGIO INTRA EXTRACRAN SEL COM CAROTID INNOMINATE UNI R MOD SED  05/25/2017  . IR ANGIO VERTEBRAL SEL SUBCLAVIAN INNOMINATE UNI R MOD SED  05/25/2017  . IR PERCUTANEOUS ART THROMBECTOMY/INFUSION INTRACRANIAL INC DIAG ANGIO  05/25/2017  . MASTECTOMY, PARTIAL  2009  . RADIOLOGY WITH ANESTHESIA N/A 05/25/2017   Procedure: IR WITH ANESTHESIA CODE STROKE;  Surgeon: Luanne Bras, MD;  Location: [facility];  Service: Radiology;  Laterality: N/A;   HPI:  84 yo female with chronic atrial fibrillation and neuropathy who was found by daughter with left hemiplegia. MRI showing acute ischemic right MCA territory infarct involving the right basal ganglia, with additional scattered subcentimeter cortical/subcortical infarcts within the overlying right cerebral hemisphere. Single punctate nonhemorrhagic left parietal cortical infarct as above. Unsuccessful revascularization. Pt intubated 1/22-1/23   Assessment / Plan / Recommendation Clinical Impression  Pt presents with a sensorimotor dysphagia marked by decreased motor control/sensation in oral cavity, leading to prolonged oral preparation, left sided residue and anterior spillage.   Consumption of thin liquids led to immediate coughing, concerning for dysphagia.  Pt with impaired awareness, inconsistent alertness during exam.  For today, recommend allowing meds crushed in puree, occasional ice chips after oral care.  Otherwise remain NPO with plans for MBS next date.  D/W RN, family, who agree with plan.  SLP Visit Diagnosis: Dysphagia, unspecified (R13.10)    Aspiration Risk       Diet Recommendation   NPO except meds crushed in puree and occasional ice chips after oral care  Medication Administration: Crushed with puree    Other  Recommendations Oral Care Recommendations: Oral care prior to ice chip/H20;Oral care QID   Follow up Recommendations        Frequency and Duration            Prognosis        Swallow Study   General Date of Onset: 05/27/17 HPI: 84 yo female with chronic atrial fibrillation and neuropathy who was found by daughter with left hemiplegia. MRI showing acute ischemic right MCA territory infarct involving the right basal ganglia, with additional scattered subcentimeter cortical/subcortical infarcts within the overlying right cerebral hemisphere. Single punctate nonhemorrhagic left parietal cortical infarct as above. Unsuccessful revascularization. Pt intubated 1/22-1/23 Type of Study: Bedside Swallow Evaluation Previous Swallow Assessment: no Diet Prior to this Study: NPO Temperature Spikes Noted: No Respiratory Status: Nasal cannula History of Recent Intubation: Yes Length of Intubations (days): 1 days Date extubated: 05/26/17 Behavior/Cognition: Alert Oral Cavity Assessment: Within Functional Limits Oral Care Completed by SLP: Recent completion by staff Oral Cavity - Dentition: Missing dentition Vision: Impaired for self-feeding Self-Feeding Abilities: Needs assist Patient Positioning: Upright in bed  Baseline Vocal Quality: Normal Volitional Cough: Strong Volitional Swallow: Able to elicit    Oral/Motor/Sensory Function  Overall Oral Motor/Sensory Function: Moderate impairment Facial ROM: Suspected CN VII (facial) dysfunction;Reduced left Facial Symmetry: Suspected CN VII (facial) dysfunction;Abnormal symmetry left Facial Sensation: Reduced left;Suspected CN V (Trigeminal) dysfunction Lingual Symmetry: Within Functional Limits   Ice Chips Ice chips: Within functional limits Presentation: Spoon   Thin Liquid Thin Liquid: Impaired Presentation: Cup;Straw Oral Phase Functional Implications: Left anterior spillage Pharyngeal  Phase Impairments: Cough - Delayed    Nectar Thick Nectar Thick Liquid: Not tested   Honey Thick Honey Thick Liquid: Not tested   Puree Puree: Impaired Presentation: Spoon Oral Phase Functional Implications: Prolonged oral transit   Solid   GO   Solid: Not tested        Juan Quam Laurice 05/27/2017,3:50 PM  Estill Bamberg L. Tivis Ringer, [place] CCC/SLP Pager [phone]

## 2017-05-28 ENCOUNTER — Inpatient Hospital Stay (HOSPITAL_COMMUNITY): Payer: Medicare Other

## 2017-05-28 DIAGNOSIS — I63311 Cerebral infarction due to thrombosis of right middle cerebral artery: Secondary | ICD-10-CM

## 2017-05-28 LAB — URINALYSIS, ROUTINE W REFLEX MICROSCOPIC
BACTERIA UA: NONE SEEN
Bilirubin Urine: NEGATIVE
Glucose, UA: NEGATIVE mg/dL
Ketones, ur: 80 mg/dL — AB
Leukocytes, UA: NEGATIVE
NITRITE: NEGATIVE
PROTEIN: NEGATIVE mg/dL
Specific Gravity, Urine: 1.01 (ref 1.005–1.030)
Squamous Epithelial / LPF: NONE SEEN
pH: 5 (ref 5.0–8.0)

## 2017-05-28 MED ORDER — RESOURCE THICKENUP CLEAR PO POWD
Freq: Once | ORAL | Status: AC
  Administered 2017-05-28: 15:00:00 via ORAL
  Filled 2017-05-28: qty 125

## 2017-05-28 MED ORDER — MIDODRINE HCL 5 MG PO TABS
2.5000 mg | ORAL_TABLET | Freq: Three times a day (TID) | ORAL | Status: DC
  Administered 2017-05-28 (×2): 2.5 mg via ORAL
  Filled 2017-05-28 (×2): qty 1

## 2017-05-28 NOTE — Progress Notes (Signed)
PULMONARY / CRITICAL CARE MEDICINE   Name: Chloe Lopez MRN: [id number] DOB: 01-02-1924    ADMISSION DATE:  05/25/2017   CHIEF COMPLAINT:  Left hemiplegia  HISTORY OF PRESENT ILLNESS:        This is a 84 year old who was last known to be normal at 7 PM who was found by her daughter approximately 1030 the next morning with a left hemiplegia.  She was brought to our department of emergency medicine where CT was remarkable only for a dense right MCA sign.  A CT angiogram showed complete occlusion of the right internal carotid.  The patient was intubated for deep sedation for interventional radiology after she was found to have a very large penumbra in excess of 130 cc on the perfusion scan.  The right internal carotid could not be recannulated.  She arrived in the intensive care unit intubated on 1/22.  We were unable to extubate the patient due to excessive sedation.        She was extubated on 1/23 and thishas been well-tolerated.  She is continuing to requiring a modest dose of Neo-Synephrine to support her blood pressure, subjectively seems to be a little more alert with her blood pressure maintained above 140.  She has been conversant with family, and it is intermittently speaking in complete sentences for me.   PAST MEDICAL HISTORY :  She  has a past medical history of Angioedema, Depression, Dysphagia, pharyngoesophageal phase (07/07/2012), Left hip pain (07/22/2011), Leg swelling (11/02/2012), Sciatica (08/21/2011), Sciatica (04/19/2013), Sweating (01/21/2012), and Urticaria.  PAST SURGICAL HISTORY: She  has a past surgical history that includes Mastectomy, partial (2009); Abdominal hysterectomy; Radiology with anesthesia (N/A, 05/25/2017); IR PERCUTANEOUS ART THROMBECTOMY/INFUSION INTRACRANIAL INC DIAG ANGIO (05/25/2017); IR ANGIO VERTEBRAL SEL SUBCLAVIAN INNOMINATE UNI R MOD SED (05/25/2017); and IR ANGIO INTRA EXTRACRAN SEL COM CAROTID INNOMINATE UNI R MOD SED (05/25/2017).  Allergies  Allergen  Reactions  . Azithromycin Swelling    Reportedly had reaction September 2017 - caused worsening angioedema. Unclear if it was this or the benzonatate she started at the same time.  . Benzonatate Swelling    Reportedly had reaction September 2017 - caused worsening angioedema. Unclear if it was this or the azithromycin she started at the same time.    No current facility-administered medications on file prior to encounter.    Current Outpatient Medications on File Prior to Encounter  Medication Sig  . CARTIA XT 180 MG 24 hr capsule TAKE ONE CAPSULE BY MOUTH DAILY  . doxepin (SINEQUAN) 10 MG capsule Take 3 capsules (30 mg total) by mouth at bedtime. (Patient taking differently: Take 20-30 mg by mouth at bedtime. )  . EPINEPHrine (EPIPEN 2-PAK) 0.3 mg/0.3 mL IJ SOAJ injection Inject 0.3 mg into the muscle once. Reported on 08/30/2015  . predniSONE (DELTASONE) 10 MG tablet TAKE AS DIRECTED. ALTERNATE 10 MG ONE DAY THEN 5 MG THE NEXT. (Patient taking differently: Take 5-10 mg by mouth See admin instructions. Alternate every over day with 5mg  and 10mg .)  . ranitidine (ZANTAC) 150 MG tablet Take one tablet twice a day. (Patient taking differently: Take 150 mg by mouth daily as needed for heartburn. )  . traMADol (ULTRAM) 50 MG tablet Take 1 tablet (50 mg total) daily as needed by mouth (pain in legs).  . [DISCONTINUED] DOXEPIN HCL PO Take by mouth. 30 mg  Twice a day    FAMILY HISTORY:  Her indicated that her mother is deceased. She indicated that her father is deceased.  She indicated that only one of her two sisters is alive. She indicated that her brother is deceased.   SOCIAL HISTORY: She  reports that she quit smoking about 21 years ago. she has never used smokeless tobacco. She reports that she drinks about 1.2 oz of alcohol per week. She reports that she does not use drugs.  REVIEW OF SYSTEMS:   Not obtainable  SUBJECTIVE:  As above  VITAL SIGNS: BP 131/65   Pulse 74   Temp 98.3 F  (36.8 C) (Oral)   Resp 20   Ht 5\' 5"  (1.651 m)   Wt 151 lb 14.4 oz (68.9 kg)   SpO2 96%   BMI 25.28 kg/m   HEMODYNAMICS:    VENTILATOR SETTINGS:    INTAKE / OUTPUT: I/O last 3 completed shifts: In: 5016.4 [I.V.:4616.4; IV Piggyback:400] Out: 3500 [Urine:3500]  PHYSICAL EXAMINATION: General: Elderly white female resting comfortably in bed and in no acute distress.   Neuro: She does not open eyes to voice for me but she does appropriately answer questions.  She moves the right extremities on request not the left.  Pupils are equal. Cardiovascular: S1 and S2 are irregularly irregular  without murmur rub or gallop Lungs: Respirations are unlabored, there is symmetric air movement, there are fewer rhonchi today in the exam is symmetrical.   There are no wheezes. Abdomen: The abdomen is soft without any organomegaly masses or tenderness. Musculoskeletal: There is trace lower extremity edema.  LABS:  BMET Recent Labs  Lab 05/25/17 1118 05/25/17 1124 05/26/17 0730  NA 139 140 138  K 3.9 3.7 2.9*  CL 102 101 109  CO2 23  --  19*  BUN 11 12 7   CREATININE 0.74 0.60 0.60  GLUCOSE 118* 119* 110*    Electrolytes Recent Labs  Lab 05/25/17 1118 05/26/17 0730  CALCIUM 9.0 8.1*    CBC Recent Labs  Lab 05/25/17 1118 05/25/17 1124  WBC 10.6*  --   HGB 15.7* 16.0*  HCT 46.6* 47.0*  PLT 276  --     Coag's Recent Labs  Lab 05/25/17 1118  APTT 27  INR 1.03    Sepsis Markers No results for input(s): LATICACIDVEN, PROCALCITON, O2SATVEN in the last 168 hours.  ABG Recent Labs  Lab 05/25/17 1654  PHART 7.366  PCO2ART 38.5  PO2ART 145.0*    Liver Enzymes Recent Labs  Lab 05/25/17 1118  AST 20  ALT 11*  ALKPHOS 64  BILITOT 0.7  ALBUMIN 3.3*    Cardiac Enzymes No results for input(s): TROPONINI, PROBNP in the last 168 hours.  Glucose Recent Labs  Lab 05/25/17 1118  GLUCAP 109*    Imaging No results found.   STUDIES:  MRI last night showed  an infarct involving the right caudate and patchy right subcortical infarcts on the right.  DISCUSSION:       This is a 84 year old with chronic atrial fibrillation who presented with left hemiplegia.  She was found to have a occluded left carotid which could not be recannulated.  She had a very large penumbra on perfusion study.  Remarkably she has not evolved an extremely large infarct on the follow-up MRI.  ASSESSMENT / PLAN:  PULMONARY Extubation has been well-tolerated.   CARDIOVASCULAR A: She is currently on Neo-Synephrine targeting a systolic pressure between 140 and 160 in the hopes of maintaining collateral flow.  Her level of consciousness seems to be somewhat dependent on her blood pressure.  She is in chronic atrial fibrillation and for  now we are using IV Lopressor for rate control, as we have no enteral route to administer her usual calcium channel blocker.   I will discussed the introduction of a full dose anticoagulant with neurology, and after obtaining input from family as to whether or not they would wish [place] to place a feeding tube for administration of enteral medications should she fail her swallow study today.    GASTROINTESTINAL A: On Pepcid for GI prophylaxis  HEMATOLOGIC A: On subcutaneous heparin for DVT prophylaxis   NEUROLOGIC A: As noted she has suffered from a right basal ganglia infarct with multiple patchy infarcts in the subcortical region of the right hemisphere.  She continues on aspirin and a statin.  This is in the setting of chronic atrial fibrillation and I will discuss with neurology the introduction of full dose anticoagulation at some point.  We have asked family to consider what supportive care the patient would desire.  At this point they are inclined towards no limitation of therapy.   Lars Masson, MD Pulmonary and [place] Pager: [phone]  05/28/2017, 9:25 AM

## 2017-05-28 NOTE — Progress Notes (Signed)
NEUROHOSPITALISTS STROKE TEAM - DAILY PROGRESS NOTE   ADMISSION HISTORY:  Chloe Lopez is a 84 y.o. female who has a past medical history of atrial fibrillation not on anticoagulation, peripheral neuropathy who is functionally independent at baseline, lives at home, who was in her usual state of health to the best of family's knowledge at 7 PM last night when the daughter spoke with her, and found this morning to be weak on the left side and gazing to the right.  It is unclear if she was seen anytime after 7 PM until being found this morning with left-sided weakness.  The daughter attempted to contact other family members who might have been in touch with her, but no information was available. The daughter says the patient could not move her left arm or leg, was getting to the right had left-sided facial weakness and droop along with slurred speech.  EMS was called.  They also noted left hemiplegia, right gaze preference and left facial weakness along with dysarthria.   She was brought into the emergency room.  Taken in for a noncontrast CT of the head that did not reveal any acute bleed but showed a hyperdense right MCA.  She was outside the window for IV TPA hence did not get TPA.  CT angiogram of the head and neck and CT perfusion study was done that showed right ICA occlusion at the bifurcation and a CT perfusion study that showed to 8 cc core and 132 cc area at risk.  Risks benefits of neuro intervention were discussed with the daughter and she was taken in for diagnostic cerebral angiogram and possible thrombectomy.  LKW: 1900 hrs. on 05/25/2017 tpa given?: no, outside the window Premorbid modified Rankin scale (mRS): 0  NIHSS 1a Level of Conscious.: 0 1b LOC Questions: 0 1c LOC Commands: 0 2 Best Gaze: 1 3 Visual: 2 4 Facial Palsy: 2 5a Motor Arm - left: 4 5b Motor Arm - Right: 0 6a Motor Leg - Left: 3 6b Motor Leg - Right: 0 7 Limb  Ataxia: 0 8 Sensory: 2 9 Best Language: 1 10 Dysarthria: 2 11 Extinct. and Inatten.: 2 TOTAL: 19  SUBJECTIVE (INTERVAL HISTORY) No family at bedside this morning. Yesterday two daughters are at the bedside, discussed with family and answered all questions. Patient is found laying in bed in NAD, remains sedated and intubated. No change in neuro exam. No new/ acute events reported overnight.BP adequately controlled. Patient can answer appropriately, follows one step commands, assisted in tranferring to chair.     OBJECTIVE Lab Results: CBC:  Recent Labs  Lab 05/25/17 1118 05/25/17 1124  WBC 10.6*  --   HGB 15.7* 16.0*  HCT 46.6* 47.0*  MCV 95.9  --   PLT 276  --    BMP: Recent Labs  Lab 05/25/17 1118 05/25/17 1124 05/26/17 0730  NA 139 140 138  K 3.9 3.7 2.9*  CL 102 101 109  CO2 23  --  19*  GLUCOSE 118* 119* 110*  BUN 11 12 7   CREATININE 0.74 0.60 0.60  CALCIUM 9.0  --  8.1*   Liver Function Tests:  Recent Labs  Lab 05/25/17 1118  AST 20  ALT 11*  ALKPHOS 64  BILITOT 0.7  PROT 6.0*  ALBUMIN 3.3*   Coagulation Studies:  Recent Labs    05/25/17 1118  APTT 27  INR 1.03   PHYSICAL EXAM Temp:  [98.2 F (36.8 C)-99.9 F (37.7 C)] 99.9 F (37.7 C) (  01/25 0800) Pulse Rate:  [46-113] 63 (01/25 1000) Resp:  [13-30] 20 (01/25 1000) BP: (101-161)/(56-128) 133/70 (01/25 1000) SpO2:  [90 %-99 %] 96 % (01/25 1000) Arterial Line BP: (84-175)/(52-97) 162/65 (01/25 1000) General - Well nourished, well developed, in no apparent distress HEENT-  Normocephalic,    Cardiovascular - Irregular rate and rhythm  Respiratory - Lungs clear bilaterally. No wheezing. Abdomen - soft and non-tender, BS normal Extremities- no edema or cyanosis Neurological exam- extubated, lethargic but opens eyes to voice and nods appropriately, follows simple comands Cranial nerves: Pupils equal round reactive to light, right gaze preference able to cross midline to look at the left, left  homonymous hemianopsia, left lower facial weakness, decreased sensation in the left side of the face. Motor exam: 0/5 left upper extremity, 1/5 left lower extremity, 5/5 right upper and right lower extremities.+ withdrawal to pain on left Sensory exam: Dense loss with neglect on the left side. Coordination: cannot performed due to sedation Gait exam - not performed  IMAGING: I have personally reviewed the radiological images below and agree with the radiology interpretations. Ct Angio Head/Neck W Or Wo Contrast Result Date: 05/25/2017 IMPRESSION: 1. The right internal carotid artery is occluded at the carotid bifurcation. 2. The fetal type right posterior cerebral artery fills via a small right P1 segment to the right ICA terminus. 3. Right M1 occlusion with reconstitution of right MCA branch vessels beyond the bifurcation suggesting collateral flow. 4. Right A1 segment flow is likely retrograde. 5. No significant left-sided stenosis. 6. Aortic Atherosclerosis (ICD10-I70.0). No significant stenosis or aneurysm at the aortic arch. 7. Multilevel spondylosis of the cervical spine. These results were called by telephone at the time of interpretation on 05/25/2017 at 11:58 Am to Dr. Amie Portland , who verbally acknowledged these results. Electronically Signed   By: San Morelle M.D.   On: 05/25/2017 12:07   Mr Brain Wo Contrast Result Date: 05/25/2017  IMPRESSION: 1. Acute ischemic right MCA territory infarct involving the right basal ganglia, with additional scattered subcentimeter cortical subcortical infarcts within the overlying right cerebral hemisphere. No associated hemorrhage or mass effect. 2. Single punctate nonhemorrhagic left parietal cortical infarct as above. 3. Abnormal flow voids within the right ICA and M1 segment, consistent with previously identified occlusion. 4. Age-related atrophy with mild to moderate chronic small vessel ischemic disease. Electronically Signed   By: Jeannine Boga M.D.   On: 05/25/2017 23:53   Ct Cerebral Perfusion W Contrast Result Date: 05/25/2017 IMPRESSION: 1. Large area of ischemic penumbra involving the right MCA territory with estimated volume of 130 mL and small core infarct involving the basal ganglia. The estimated core volume is 8 mm. 2. The right MCA ischemia corresponds to right ICA and M1 occlusions. Electronically Signed   By: San Morelle M.D.   On: 05/25/2017 12:09   Dg Chest Port 1 View Result Date: 05/25/2017 IMPRESSION: Satisfactory endotracheal tube position. Chronic bronchitic changes with minimal LEFT basilar atelectasis. Electronically Signed   By: Lavonia Dana M.D.   On: 05/25/2017 16:13   Ct Head Code Stroke Wo Contrast Addendum Date: 05/25/2017   Result Date: 05/25/2017 IMPRESSION: 1. Hyperdense right MCA compatible with thrombosis. No acute infarct or hemorrhage 2. ASPECTS is 10 I have paged the stroke neurologist. Electronically Signed: By: Franchot Gallo M.D. On: 05/25/2017 11:35   Echocardiogram:  PENDING    IMPRESSION: [name] is a 84 y.o. female with PMH of atrial fibrillation not on anticoagulation, peripheral neuropathy functionally dependent at baseline lives at home in her usual state of health last known normal at 7 PM last night was found to have left-sided weakness and right gaze preference along with dysarthria and left facial weakness. NIH stroke scale 19 for a right MCA syndrome.  Noncontrast CT of the head shows no bleed.  CT aspects 728.  CT angiogram head and neck showed right ICA occlusion and right MCA occlusion in the M1 segment.  CT perfusion profile favorable for emergent intervention because of a small 8 cc core and large ischemic penumbra 130 cc.    Acute ischemic right MCA territory infarct -Right basal ganglia, with additional scattered subcentimeter cortical subcortical infarcts within the overlying right cerebral  hemisphere Occlusion and stenosis of R carotid artery  Suspected Etiology: cardioembolic source, AFIB not on AC therapy Resultant Symptoms: Left sided defcits Stroke Risk Factors: atrial fibrillation and hypertension Other Stroke Risk Factors: Advanced age,   PROCEDURES:  05/25/2017 by Dr Estanislado Pandy s/p revascularization attempt, unable to achieve reperfusion.  Outstanding Stroke Work-up Studies:     Echocardiogram:Normal LV systolic function; trace MR; mild TR  PLAN  05/28/2017: Continue Aspirin/ Statin Medical management so patient can go to CIR Frequent neuro checks Telemetry monitoring PT/OT/SLP Case Management /MSW Ongoing aggressive stroke risk factor management Patient's family will be counseled to be compliant with her antithrombotic medications Patient's family will be counseled on Lifestyle modifications including, Diet, Exercise, and Stress Follow up with [facility] in 6 weeks Cecille Rubin  DYSPHAGIA: NPO until passes SLP swallow evaluation Aspiration Precautions in progress  MEDICAL ISSUES: ACUTE RESPIRATORY FAILURE: [place] Management per CCM team - Appreciate assistance Extubated today  AFIB, CHRONIC: Cardizem started today for rate control Will likely need AC therapy, defer to Dr. Erlinda Hong tomorrow but due to size of stroke prefer to wait until 1-2 weeks afterwards  HEME Hemoglobin 16 -Monitor -transfuse for hgb < 7 -Trend PT/PTT/INR  Fluid/Electrolyte Disorders-  Hypokalemia Check labs Replete per ICU protocol  ID Possible Aspiration PNA -CXR -NPO -Monitor  R/O UTI: Cultures pending  HYPERTENSION: Stable SBP goal less than 180/90  Nicardipine drip, Labetolol PRN Long term BP goal normotensive. May slowly restart home B/P medications after 48 hours Home Meds: Cartia XT  HYPERLIPIDEMIA:    Component Value Date/Time   CHOL 141 05/26/2017 0414   TRIG 62 05/26/2017 0414   HDL 55 05/26/2017 0414   CHOLHDL 2.6 05/26/2017 0414    VLDL 12 05/26/2017 0414   LDLCALC 74 05/26/2017 0414  Home Meds:  NONE LDL  goal < 70 Started on Lipitor to 10 mg daily Continue statin at discharge  R/O DIABETES: Lab Results  Component Value Date   HGBA1C 5.5 05/26/2017  HgbA1c goal < 7.0 Continue CBG monitoring and SSI to maintain glucose 140-180 mg/dl  Other Active Problems: Active Problems:   Stroke (cerebrum) (HCC)   Middle cerebral artery embolism, right    Hospital day # 3 VTE prophylaxis: Heparin Diet : Fall precautions Diet NPO time specified   FAMILY UPDATES: family at bedside  TEAM UPDATES: Garvin Fila, MD STATUS:  FULL   Prior Home Stroke Medications:  No antithrombotic  Discharge Stroke Meds:  Please discharge patient on TBD   Disposition: 01-Home or Self Care Therapy Recs:  CIR Home Equipment:         PENDING Follow Up:  Follow-up Information    Garvin Fila, MD. Schedule an appointment as soon as possible for a visit in 6 week(s).   Specialties:  Neurology, Radiology Contact information: [address] [place] [address] [phone]          Leeanne Rio, MD -PCP Follow up in 1-2 weeks      Personally examined patient and images, and have participated in and made any corrections needed to history, physical, neuro exam,assessment and plan as stated above.  I have personally obtained the history, evaluated lab date, reviewed imaging studies and agree with radiology interpretations.    Sarina Ill, MD Stroke Neurology  To contact Stroke Continuity provider, please refer to [email or web address] After hours, contact General Neurology

## 2017-05-28 NOTE — Care Management Note (Signed)
Case Management Note  Patient Details  Name: [name] MRN: [id number] Date of Birth: 1924/03/19  Subjective/Objective:   84 yo female with chronic atrial fibrillation and neuropathy who was found by daughter with left hemiplegia. MRI showing acute ischemic right MCA territory infarct involving the right.  PTA, pt independent, lives alone.                  Action/Plan: PT/OT recommending CIR and consult in progress.  Will follow for discharge planning as pt progresses.    Expected Discharge Date:                  Expected Discharge Plan:  [place]  In-House Referral:     Discharge planning Services  CM Consult  Post Acute Care Choice:    Choice offered to:     DME Arranged:    DME Agency:     HH Arranged:    [place] Agency:     Status of Service:  In process, will continue to follow  If discussed at Long Length of Stay Meetings, dates discussed:    Additional Comments:  Reinaldo Raddle, RN, BSN  Trauma/Neuro ICU Case Manager [phone]

## 2017-05-28 NOTE — Progress Notes (Signed)
Modified Barium Swallow Progress Note  Patient Details  Name: ETHNE JEON MRN: [id number] Date of Birth: 1923-05-14  Today's Date: 05/28/2017  Modified Barium Swallow completed.  Full report located under Chart Review in the Imaging Section.  Brief recommendations include the following:  Clinical Impression  Pt presents with a moderate oral, mild pharyngeal dysphagia exacerbated by decreased MS.  Pt with impaired oral sensation and motor control, leading to decreased bolus cohesion, anterior spillage, and pocketing in left lateral sulcus.  For liquids, pharyngeal swallow triggers at the level of the pyriforms, with subsequent deep penetration of thins to the level of the vocal cords. Nectar thick liquids observed to penetrate under the upper 1/3 of the epiglottis, ejected from vestibule upon completion of swallow.  There was adequate pharyngeal contraction with limited residue post-swallow.  For now, recommend initiating a conservative diet of dysphagia 1 with nectar-thick liquids; crush meds in puree.  Provide full supervision with meals; check left cheek for pocketing; feed pt only when alert.    Swallow Evaluation Recommendations       SLP Diet Recommendations: Dysphagia 1 (Puree) solids;Nectar thick liquid   Liquid Administration via: Cup;Straw   Medication Administration: Crushed with puree       Compensations: Minimize environmental distractions;Slow rate;Small sips/bites;Lingual sweep for clearance of pocketing       Oral Care Recommendations: Oral care BID   Other Recommendations: Order thickener from pharmacy    Juan Quam Laurice 05/28/2017,1:57 PM

## 2017-05-28 NOTE — Progress Notes (Signed)
I met with pt's three daughters at bedside to discuss rehab venue differences and goals. I explained that Dr. Letta Pate feels pt currently not at a level to be able to tolerate CIR level of therapies. We also discussed caregiver support needed after any rehab venue for physical care 24/7 at home as pt previously lived alone and cared for self. They will discuss amongst themselves and I will follow up next week with her progress. [phone]

## 2017-05-28 NOTE — Progress Notes (Addendum)
Occupational Therapy Treatment Patient Details Name: Chloe Lopez MRN: [id number] DOB: 09/17/1923 Today's Date: 05/28/2017    History of present illness 84 yo female with chronic atrial fibrillation and neuropathy who was found by daughter with left hemiplegia. MRI showing Acute ischemic right MCA territory infarct involving the right. Unsuccessful revascularization. Pt intubated 1/22-1/23   OT comments  This 84 yo female admitted with above presents to acute OT with making progress with grooming, sitting balance, and transfers. She will continue to benefit from acute OT with follow up OT on CIR.  Follow Up Recommendations  CIR;Supervision/Assistance - 24 hour    Equipment Recommendations  Other (comment)(TBD at next venue)       Precautions / Restrictions Precautions Precautions: Fall Precaution Comments: left hemiplegia, left inattention, very sensitive skin bil LE (does better if help her move from the soles of her feet v calves) Restrictions Weight Bearing Restrictions: No       Mobility Bed Mobility Overal bed mobility: Needs Assistance Bed Mobility: Rolling;Sidelying to Sit Rolling: Max assist(with pad so as to not make her legs hurt) Sidelying to sit: Max assist;+2 for physical assistance          Transfers Overall transfer level: Needs assistance Equipment used: 2 person hand held assist Transfers: Stand Pivot Transfers   Stand pivot transfers: Max assist;+2 physical assistance       General transfer comment: max assist with pad and belt to stand pivot to pt's left with left knee blocked    Balance Overall balance assessment: Needs assistance Sitting-balance support: Single extremity supported;Feet supported   Sitting balance - Comments: used pillows on left side to support LUE in stting, sat EOB ~15 minutes with max A-min guard A. Pt pushing intermittently with RUE   Standing balance support: Bilateral upper extremity supported Standing balance-Leahy  Scale: Poor                             ADL either performed or assessed with clinical judgement   ADL Overall ADL's : Needs assistance/impaired     Grooming: Wash/dry face;Brushing hair;Moderate assistance Grooming Details (indicate cue type and reason): sitting EOB (with varying balance of max A-min A); VCs to be thorough with both tasks (not automatically washing left side of face or combing left side of hair); increased time with increased cues and mod hand over hand to intiate combing left side                                     Vision Baseline Vision/History: Wears glasses Wears Glasses: Reading only Additional Comments: pt with tendency to keep head and eyes to right. With cues she can move her head and eyes past midline to left, but not all the way. Initally left eye closed but she did open both eyes when asked.          Cognition Arousal/Alertness: Awake/alert(with closing eyes intermittently the longer we had her sitting on EOB) Behavior During Therapy: Flat affect Overall Cognitive Status: Impaired/Different from baseline Area of Impairment: Orientation;Attention;Memory;Following commands;Safety/judgement;Awareness;Problem solving                 Orientation Level: Place Current Attention Level: Focused Memory: Decreased recall of precautions Following Commands: Follows one step commands inconsistently;Follows one step commands with increased time Safety/Judgement: Decreased awareness of deficits;Decreased awareness of safety Awareness: Intellectual Problem Solving: Slow  processing;Decreased initiation;Difficulty sequencing;Requires verbal cues;Requires tactile cues General Comments: pt stating she wants to go upstairs, thinks she is at [place] (her grandson's)                   Pertinent Vitals/ Pain       Pain Assessment: Faces Faces Pain Scale: Hurts little more Pain Location: bil LE to touch Pain Descriptors /  Indicators: Sore Pain Intervention(s): Limited activity within patient's tolerance;Repositioned;Monitored during session         Frequency  Min 2X/week        Progress Toward Goals  OT Goals(current goals can now be found in the care plan section)  Progress towards OT goals: Progressing toward goals     Plan Discharge plan remains appropriate    Co-evaluation    PT/OT/SLP Co-Evaluation/Treatment: Yes Reason for Co-Treatment: Complexity of the patient's impairments (multi-system involvement);For patient/therapist safety;To address functional/ADL transfers   OT goals addressed during session: ADL's and self-care;Strengthening/ROM      AM-PAC PT "6 Clicks" Daily Activity     Outcome Measure   Help from another person eating meals?: Total Help from another person taking care of personal grooming?: A Lot Help from another person toileting, which includes using toliet, bedpan, or urinal?: Total Help from another person bathing (including washing, rinsing, drying)?: A Lot Help from another person to put on and taking off regular upper body clothing?: Total Help from another person to put on and taking off regular lower body clothing?: Total 6 Click Score: 8    End of Session Equipment Utilized During Treatment: Gait belt  OT Visit Diagnosis: Unsteadiness on feet (R26.81);Other abnormalities of gait and mobility (R26.89);Muscle weakness (generalized) (M62.81);Other symptoms and signs involving cognitive function;Hemiplegia and hemiparesis Hemiplegia - Right/Left: Left Hemiplegia - dominant/non-dominant: Non-Dominant Hemiplegia - caused by: Cerebral infarction   Activity Tolerance Patient tolerated treatment well   Patient Left in chair;with call bell/phone within reach;with chair alarm set   Nurse Communication Mobility status;Need for lift equipment        Time: 1021-1058 OT Time Calculation (min): 37 min  Charges: OT General Charges $OT Visit: 1 Visit OT  Treatments $Self Care/Home Management : 8-22 mins  [place] [address], OTR/L [phone] 05/28/2017

## 2017-05-28 NOTE — Progress Notes (Signed)
Physical Therapy Treatment Patient Details Name: Chloe Lopez MRN: [id number] DOB: 12-24-1923 Today's Date: 05/28/2017    History of Present Illness 84 yo female with chronic atrial fibrillation and neuropathy who was found by daughter with left hemiplegia. MRI showing Acute ischemic right MCA territory infarct involving the right. Unsuccessful revascularization. Pt intubated 1/22-1/23    PT Comments    Pt with improved arousal and communication today. Pt able to look at midline and attend to left side with cues. Pt able to comb her hair with assist sitting EOB, and demonstrated increased sitting balance and activity tolerance. Pt with family present and education for attention to and visiting from left side.     Follow Up Recommendations  CIR;Supervision/Assistance - 24 hour     Equipment Recommendations  Wheelchair (measurements PT);3in1 (PT);Wheelchair cushion (measurements PT);Hospital bed    Recommendations for Other Services       Precautions / Restrictions Precautions Precautions: Fall Precaution Comments: left hemiplegia, left inattention, very sensitive skin bil LE (does better if help her move from her feet) Restrictions Weight Bearing Restrictions: No    Mobility  Bed Mobility Overal bed mobility: Needs Assistance Bed Mobility: Rolling;Sidelying to Sit Rolling: Max assist Sidelying to sit: Max assist;+2 for physical assistance       General bed mobility comments: cues for sequence to roll left with hand over hand assist to reach for rail and rise from side to sitting. pt with left lean in sitting   Transfers Overall transfer level: Needs assistance Equipment used: 2 person hand held assist Transfers: Sit to/from [facility] Sit to Stand: Max assist;+2 physical assistance Stand pivot transfers: Max assist;+2 physical assistance       General transfer comment: max assist with pad and belt to stand pivot to pt's left with left knee blocked.  Stood x 2 from slightly elevated bed, assist in standing to pivot hips and transition to chair  Ambulation/Gait             General Gait Details: unable   Stairs            Wheelchair Mobility    Modified Rankin (Stroke Patients Only) Modified Rankin (Stroke Patients Only) Pre-Morbid Rankin Score: No symptoms Modified Rankin: Severe disability     Balance Overall balance assessment: Needs assistance Sitting-balance support: Single extremity supported;Feet supported Sitting balance-Leahy Scale: Poor Sitting balance - Comments: used pillows on left side to support LUE in stting, sat EOB ~15 minutes with max A-min guard A. Pt pushing intermittently with RUE. Pt with use of rail to right to maintain sitting and balance Postural control: Left lateral lean Standing balance support: Bilateral upper extremity supported Standing balance-Leahy Scale: Poor Standing balance comment: 2 person assist in standing                            Cognition Arousal/Alertness: Awake/alert Behavior During Therapy: Flat affect Overall Cognitive Status: Impaired/Different from baseline Area of Impairment: Orientation;Attention;Memory;Following commands;Safety/judgement;Awareness;Problem solving                 Orientation Level: Place;Situation Current Attention Level: Focused Memory: Decreased recall of precautions Following Commands: Follows one step commands inconsistently;Follows one step commands with increased time Safety/Judgement: Decreased awareness of deficits;Decreased awareness of safety Awareness: Intellectual Problem Solving: Slow processing;Decreased initiation;Difficulty sequencing;Requires verbal cues;Requires tactile cues General Comments: pt stating she wants to go upstairs, thinks she is at [employer] (her grandson's), states he has scissors in  front of her      Exercises      General Comments        Pertinent Vitals/Pain Pain Assessment:  Faces Faces Pain Scale: Hurts a little bit Pain Location: bil LE to touch Pain Descriptors / Indicators: Sore Pain Intervention(s): Limited activity within patient's tolerance;Repositioned    Home Living                      Prior Function            PT Goals (current goals can now be found in the care plan section) Progress towards PT goals: Progressing toward goals    Frequency           PT Plan Current plan remains appropriate    Co-evaluation PT/OT/SLP Co-Evaluation/Treatment: Yes Reason for Co-Treatment: Complexity of the patient's impairments (multi-system involvement);For patient/therapist safety;Necessary to address cognition/behavior during functional activity PT goals addressed during session: Mobility/safety with mobility;Balance OT goals addressed during session: ADL's and self-care;Strengthening/ROM      AM-PAC PT "6 Clicks" Daily Activity  Outcome Measure  Difficulty turning over in bed (including adjusting bedclothes, sheets and blankets)?: Unable Difficulty moving from lying on back to sitting on the side of the bed? : Unable Difficulty sitting down on and standing up from a chair with arms (e.g., wheelchair, bedside commode, etc,.)?: Unable Help needed moving to and from a bed to chair (including a wheelchair)?: A Lot Help needed walking in hospital room?: Total Help needed climbing 3-5 steps with a railing? : Total 6 Click Score: 7    End of Session Equipment Utilized During Treatment: Gait belt Activity Tolerance: Patient tolerated treatment well Patient left: in chair;with call bell/phone within reach;with chair alarm set;with family/visitor present Nurse Communication: Mobility status;Need for lift equipment PT Visit Diagnosis: Other abnormalities of gait and mobility (R26.89);Unsteadiness on feet (R26.81);Hemiplegia and hemiparesis Hemiplegia - Right/Left: Right Hemiplegia - dominant/non-dominant: Dominant Hemiplegia - caused by:  Cerebral infarction     Time: 1021-1059 PT Time Calculation (min) (ACUTE ONLY): 38 min  Charges:  $Therapeutic Activity: 8-22 mins $Neuromuscular Re-education: 8-22 mins                    G Codes:       Elwyn Reach, PT [phone]    Shayley Medlin B Abdinasir Spadafore 05/28/2017, 2:26 PM

## 2017-05-28 NOTE — Plan of Care (Signed)
Pt tolerating PO meds crushed in magic cup

## 2017-05-29 DIAGNOSIS — I482 Chronic atrial fibrillation: Secondary | ICD-10-CM

## 2017-05-29 DIAGNOSIS — G8194 Hemiplegia, unspecified affecting left nondominant side: Secondary | ICD-10-CM

## 2017-05-29 DIAGNOSIS — E876 Hypokalemia: Secondary | ICD-10-CM

## 2017-05-29 LAB — BASIC METABOLIC PANEL
Anion gap: 10 (ref 5–15)
BUN: 7 mg/dL (ref 6–20)
CHLORIDE: 108 mmol/L (ref 101–111)
CO2: 23 mmol/L (ref 22–32)
Calcium: 8.4 mg/dL — ABNORMAL LOW (ref 8.9–10.3)
Creatinine, Ser: 0.51 mg/dL (ref 0.44–1.00)
GFR calc Af Amer: >60 mL/min (ref >60)
GFR calc non Af Amer: >60 mL/min (ref >60)
GLUCOSE: 94 mg/dL (ref 65–99)
Potassium: 2.6 mmol/L — CL (ref 3.5–5.1)
Sodium: 141 mmol/L (ref 135–145)

## 2017-05-29 LAB — CBC
HEMATOCRIT: 37.5 % (ref 36.0–46.0)
HEMOGLOBIN: 13 g/dL (ref 12.0–15.0)
MCH: 31.5 pg (ref 26.0–34.0)
MCHC: 34.7 g/dL (ref 30.0–36.0)
MCV: 90.8 fL (ref 78.0–100.0)
Platelets: 296 10*3/uL (ref 150–400)
RBC: 4.13 MIL/uL (ref 3.87–5.11)
RDW: 13.3 % (ref 11.5–15.5)
WBC: 8 10*3/uL (ref 4.0–10.5)

## 2017-05-29 LAB — MAGNESIUM: MAGNESIUM: 1.6 mg/dL — AB (ref 1.7–2.4)

## 2017-05-29 LAB — PHOSPHORUS: Phosphorus: 1.5 mg/dL — ABNORMAL LOW (ref 2.5–4.6)

## 2017-05-29 MED ORDER — POTASSIUM CHLORIDE 10 MEQ/100ML IV SOLN
10.0000 meq | INTRAVENOUS | Status: AC
  Administered 2017-05-29 (×5): 10 meq via INTRAVENOUS
  Filled 2017-05-29 (×5): qty 100

## 2017-05-29 MED ORDER — MIDODRINE HCL 5 MG PO TABS
5.0000 mg | ORAL_TABLET | Freq: Three times a day (TID) | ORAL | Status: DC
  Administered 2017-05-29 – 2017-05-31 (×7): 5 mg via ORAL
  Filled 2017-05-29 (×8): qty 1

## 2017-05-29 MED ORDER — POTASSIUM CHLORIDE 20 MEQ PO PACK
40.0000 meq | PACK | Freq: Two times a day (BID) | ORAL | Status: DC
  Administered 2017-05-29 – 2017-05-30 (×2): 40 meq via ORAL
  Filled 2017-05-29 (×2): qty 2

## 2017-05-29 MED ORDER — POTASSIUM CHLORIDE 20 MEQ PO PACK
20.0000 meq | PACK | Freq: Once | ORAL | Status: AC
  Administered 2017-05-29: 20 meq via ORAL
  Filled 2017-05-29: qty 1

## 2017-05-29 MED ORDER — MAGNESIUM SULFATE 50 % IJ SOLN
3.0000 g | Freq: Once | INTRAVENOUS | Status: AC
  Administered 2017-05-29: 3 g via INTRAVENOUS
  Filled 2017-05-29: qty 6

## 2017-05-29 MED ORDER — POTASSIUM CHLORIDE CRYS ER 20 MEQ PO TBCR: 20.0000 meq | EXTENDED_RELEASE_TABLET | Freq: Once | ORAL | Status: DC

## 2017-05-29 MED ORDER — FAMOTIDINE 20 MG PO TABS
20.0000 mg | ORAL_TABLET | Freq: Every day | ORAL | Status: DC
  Administered 2017-05-29 – 2017-06-02 (×3): 20 mg via ORAL
  Filled 2017-05-29 (×5): qty 1

## 2017-05-29 MED ORDER — METOPROLOL TARTRATE 12.5 MG HALF TABLET
12.5000 mg | ORAL_TABLET | Freq: Two times a day (BID) | ORAL | Status: DC
  Administered 2017-05-30 – 2017-06-03 (×8): 12.5 mg via ORAL
  Filled 2017-05-29 (×9): qty 1

## 2017-05-29 NOTE — Plan of Care (Signed)
Pt participates in self-grooming.

## 2017-05-29 NOTE — Progress Notes (Signed)
NEUROHOSPITALISTS STROKE TEAM - DAILY PROGRESS NOTE   SUBJECTIVE (INTERVAL HISTORY) Two daughters are at bedside this morning. Pt still on neo, BP 150s. Was put on midodrine yesterday. Still has left hemiplegia. On foley for urinary retention. Awake alert and no aphasia but dysarthria. K still low on supplement.      OBJECTIVE Lab Results: CBC:  Recent Labs  Lab 05/25/17 1118 05/25/17 1124 05/29/17 0500  WBC 10.6*  --  8.0  HGB 15.7* 16.0* 13.0  HCT 46.6* 47.0* 37.5  MCV 95.9  --  90.8  PLT 276  --  296   BMP: Recent Labs  Lab 05/25/17 1118 05/25/17 1124 05/26/17 0730 05/29/17 0500  NA 139 140 138 141  K 3.9 3.7 2.9* 2.6*  CL 102 101 109 108  CO2 23  --  19* 23  GLUCOSE 118* 119* 110* 94  BUN 11 12 7 7   CREATININE 0.74 0.60 0.60 0.51  CALCIUM 9.0  --  8.1* 8.4*  MG  --   --   --  1.6*  PHOS  --   --   --  1.5*   Liver Function Tests:  Recent Labs  Lab 05/25/17 1118  AST 20  ALT 11*  ALKPHOS 64  BILITOT 0.7  PROT 6.0*  ALBUMIN 3.3*   Coagulation Studies:  No results for input(s): APTT, INR in the last 72 hours.    PHYSICAL EXAM Temp:  [98.3 F (36.8 C)-98.9 F (37.2 C)] 98.3 F (36.8 C) (01/26 1200) Pulse Rate:  [49-100] 84 (01/26 1500) Resp:  [14-30] 20 (01/26 1500) BP: (104-156)/(51-108) 139/88 (01/26 1500) SpO2:  [95 %-98 %] 95 % (01/26 1500) Arterial Line BP: (75-162)/(51-122) 125/73 (01/26 1500) General - thin built, well developed, mildly lethargic HEENT-  Normocephalic   Cardiovascular - Irregular rate and rhythm  Respiratory - Lungs clear bilaterally. No wheezing. Abdomen - soft and non-tender, BS normal Extremities- no edema or cyanosis Neurological exam- lethargic but opens eyes to voice and nods appropriately, follows simple comands Cranial nerves: Pupils equal round reactive to light, right gaze preference able to cross midline to look at the left, left homonymous hemianopsia, left  lower facial weakness, decreased sensation in the left side of the face. Motor exam: 0/5 left upper extremity, 1/5 left lower extremity, 4/5 right upper and right lower extremities due to lethargy.+ withdrawal to pain on left Sensory exam: Dense loss with sensory neglect on the left side. Coordination: no ataxia on the right Gait exam - not tested  IMAGING: I have personally reviewed the radiological images below and agree with the radiology interpretations.  Ct Angio Head/Neck W Or Wo Contrast 05/25/2017 IMPRESSION:  1. The right internal carotid artery is occluded at the carotid bifurcation.  2. The fetal type right posterior cerebral artery fills via a small right P1 segment to the right ICA terminus.  3. Right M1 occlusion with reconstitution of right MCA branch vessels beyond the bifurcation suggesting collateral flow.  4. Right A1 segment flow is likely retrograde.  5. No significant left-sided stenosis.  6. Aortic Atherosclerosis (ICD10-I70.0). No significant stenosis or aneurysm at the aortic arch. 7. Multilevel spondylosis of the cervical spine.    Mr Brain Wo Contrast 05/25/2017  IMPRESSION:  1. Acute ischemic right MCA territory infarct involving the right basal ganglia, with additional scattered subcentimeter cortical subcortical infarcts within the overlying right cerebral hemisphere. No associated hemorrhage or mass effect.  2. Single punctate nonhemorrhagic left parietal cortical infarct as above.  3.  Abnormal flow voids within the right ICA and M1 segment, consistent with previously identified occlusion.  4. Age-related atrophy with mild to moderate chronic small vessel ischemic disease.    Ct Cerebral Perfusion W Contrast Result Date: 05/25/2017 IMPRESSION:  1. Large area of ischemic penumbra involving the right MCA territory with estimated volume of 130 mL and small core infarct involving the basal ganglia. The estimated core volume is 8 mm. 2. The right MCA ischemia  corresponds to right ICA and M1 occlusions.    Dg Chest Port 1 View 05/25/2017 IMPRESSION:  Satisfactory endotracheal tube position. Chronic bronchitic changes with minimal LEFT basilar atelectasis.   Ct Head Code Stroke Wo Contrast 05/25/2017   IMPRESSION:  1. Hyperdense right MCA compatible with thrombosis. No acute infarct or hemorrhage  2. ASPECTS is 10    Echocardiogram                           05/26/2017 Study Conclusions - Left ventricle: The cavity size was normal. There was mild focal   basal hypertrophy of the septum. Systolic function was normal.   The estimated ejection fraction was in the range of 55% to 60%.   Wall motion was normal; there were no regional wall motion   abnormalities. Impressions: - Normal LV systolic function; trace MR; mild TR.                 IMPRESSION: Ms. LAJUANNA POMPA is a 84 y.o. female with PMH of atrial fibrillation not on anticoagulation, peripheral neuropathy functionally dependent at baseline lives at home in her usual state of health last known normal at 7 PM last night was found to have left-sided weakness and right gaze preference along with dysarthria and left facial weakness. NIH stroke scale 19 for a right MCA syndrome.  Noncontrast CT of the head shows no bleed.  CT aspects 728.  CT angiogram head and neck showed right ICA occlusion and right MCA occlusion in the M1 segment.  CT perfusion profile favorable for emergent intervention because of a small 8 cc core and large ischemic penumbra 130 cc.    Acute ischemic right MCA territory infarct -Right basal ganglia, with additional scattered subcentimeter cortical subcortical infarcts within the overlying right cerebral hemisphere Occlusion and stenosis of R carotid artery  Suspected Etiology: cardioembolic source, AFIB not on AC therapy Resultant Symptoms: Left sided defcits Stroke Risk Factors: atrial fibrillation and hypertension Other Stroke Risk Factors: Advanced age,    PROCEDURES:  05/25/2017 by Dr Estanislado Pandy s/p revascularization attempt, unable to achieve reperfusion.  Outstanding Stroke Work-up Studies:     Echocardiogram:Normal LV systolic function; trace MR; mild TR  PLAN  05/29/2017: Continue Aspirin/ Statin Medical management so patient can go to CIR Frequent neuro checks Telemetry monitoring PT/OT/SLP Case Management /MSW Ongoing aggressive stroke risk factor management Patient's family will be counseled to be compliant with her antithrombotic medications Patient's family will be counseled on Lifestyle modifications including, Diet, Exercise, and Stress Follow up with [facility] in 6 weeks [place]: SLP swallow evaluation - now on dysphagia 1 diet with nectar thick liquids Aspiration Precautions in progress  MEDICAL ISSUES: ACUTE RESPIRATORY FAILURE: [place] Management per CCM team - Appreciate assistance Extubated   AFIB, CHRONIC: Cardizem started for rate control Will likely need AC therapy, defer to Dr. Erlinda Hong tomorrow but due to size of stroke prefer to wait until 1-2 weeks afterwards  HEME Hemoglobin 16 -Monitor -  transfuse for hgb < 7 -Trend PT/PTT/INR  Fluid/Electrolyte Disorders-  Hypokalemia Check labs -> K 2.6  - continue supplement Mg 1.6 -> supplement Recheck labs in AM Replete per ICU protocol  ID Possible Aspiration PNA -CXR -NPO -Monitor  R/O UTI: Cultures pending  HYPERTENSION: Stable SBP goal less than 180/90  Nicardipine drip, Labetolol PRN Long term BP goal normotensive. May slowly restart home B/P medications after 48 hours Home Meds: Cartia XT  HYPERLIPIDEMIA:    Component Value Date/Time   CHOL 141 05/26/2017 0414   TRIG 62 05/26/2017 0414   HDL 55 05/26/2017 0414   CHOLHDL 2.6 05/26/2017 0414   VLDL 12 05/26/2017 0414   LDLCALC 74 05/26/2017 0414  Home Meds:  NONE LDL  goal < 70 Started on Lipitor to 10 mg daily Continue statin at discharge  R/O  DIABETES: Lab Results  Component Value Date   HGBA1C 5.5 05/26/2017  HgbA1c goal < 7.0 Continue CBG monitoring and SSI to maintain glucose 140-180 mg/dl  Other Active Problems: Active Problems:   Stroke (cerebrum) (HCC)   Middle cerebral artery embolism, right    Hospital day # 4 VTE prophylaxis: Heparin Diet : Fall precautions DIET - DYS 1 Room service appropriate? Yes; Fluid consistency: Nectar Thick   FAMILY UPDATES: family at bedside  TEAM UPDATES: Rosalin Hawking, MD STATUS:  FULL   Prior Home Stroke Medications:  No antithrombotic  Discharge Stroke Meds:  Please discharge patient on TBD   Disposition: 01-Home or Self Care Therapy Recs:               CIR Home Equipment:         PENDING Follow Up:  Follow-up Information    Garvin Fila, MD. Schedule an appointment as soon as possible for a visit in 6 week(s).   Specialties:  Neurology, Radiology Contact information: [address] [place] [address] [phone]          Leeanne Rio, MD -PCP Follow up in 1-2 weeks      ATTENDING NOTE: I reviewed above note and agree with the assessment and plan. I have made any additions or clarifications directly to the above note. Pt was seen and examined.   84 year old female with history of A. fib not on anticoagulation, angioedema on prednisone admitted for left-sided weakness, right gaze, left facial droop and slurred speech.  CT showed right MCA hyperdense.  CTA head and neck showed right ICA occlusion.  CTP showed large penumbra.  Patient taken to IR, however failed to revascularize.  MRI showed right BG, CR, caudate head infarcts as well as right scattered MCA punctate infarcts.  EF 55-60%.  A1c 5.5 and LDL 74.  Currently on aspirin and Lipitor.  Patient has profound hypo-kalemia, currently under p.o. and IV supplement.  Patient also has been on Neo for blood pressure goal of 140-150.  However, patient not BP dependent and is currently day 4 of the  stroke, will relax the BP goal and to taper off neo.  Past swallow, on Midrin for BP management also.  Heart rate controlled by metoprolol and Cardizem.  PT OT recommend CIR.  Rosalin Hawking, MD PhD Stroke Neurology 05/29/2017 6:20 PM  This patient is critically ill due to right MCA infarcts status post thrombectomy attempt, atrial for ablation, left hemiplegia and at significant risk of neurological worsening, death form recurrent stroke, hemorrhagic conversion, heart failure, seizure. This patient's care requires constant monitoring of vital signs, hemodynamics, respiratory and cardiac  monitoring, review of multiple databases, neurological assessment, discussion with family, other specialists and medical decision making of high complexity. I had long discussion with daughters at bedside, updated pt current condition, treatment plan and potential prognosis. They expressed understanding and appreciation.  I also discussed with Dr. Pearline Cables. I spent 35 minutes of neurocritical care time in the care of this patient.   To contact Stroke Continuity provider, please refer to [email or web address] After hours, contact General Neurology

## 2017-05-29 NOTE — Progress Notes (Signed)
SLP Cancellation Note  Patient Details Name: Chloe Lopez MRN: [id number] DOB: 01-06-1924   Cancelled treatment:       Reason Eval/Treat Not Completed: Fatigue/lethargy limiting ability to participate. Will follow up for cognitive-linguistic evaluation.  Deneise Lever, [place], [place] Speech-Language Pathologist [phone]   Aliene Altes 05/29/2017, 4:28 PM

## 2017-05-29 NOTE — Progress Notes (Signed)
CRITICAL VALUE ALERT  Critical Value:  Potassium 2.6  Date & Time Notied:  05/29/2017 at [place]  Provider Notified: CCM MD  Orders Received/Actions taken: New orders received for replacement KCL (PO and IV).

## 2017-05-29 NOTE — Progress Notes (Signed)
PULMONARY / CRITICAL CARE MEDICINE   Name: JIANNA DRABIK MRN: [id number] DOB: 1923/10/27    ADMISSION DATE:  05/25/2017   CHIEF COMPLAINT:  Left hemiplegia  HISTORY OF PRESENT ILLNESS:        This is a 84 year old who was last known to be normal at 7 PM 1/22 who was found by her daughter approximately 1030 the next morning with a left hemiplegia.  She was brought to our department of emergency medicine where CT was remarkable only for a dense right MCA sign.  A CT angiogram showed complete occlusion of the right internal carotid.  The patient was intubated for deep sedation for interventional radiology after she was found to have a very large penumbra in excess of 130 cc on the perfusion scan.  The right internal carotid could not be recannulated.  She arrived in the intensive care unit intubated on 1/22.  We were unable to extubate the patient due to excessive sedation.        She was extubated on 1/23 and this has been well-tolerated.  She continues to require a modest dose of Neo-Synephrine to support her blood pressure despite the addition of Midodrin. Subjectively she seems to be a little more alert with her blood pressure maintained above 140.  She is much more alert and conversant with me this morning.  Despite some dysphasia she is denying cough or shortness of breath.  She is very quick to follow instructions.   PAST MEDICAL HISTORY :  She  has a past medical history of Angioedema, Depression, Dysphagia, pharyngoesophageal phase (07/07/2012), Left hip pain (07/22/2011), Leg swelling (11/02/2012), Sciatica (08/21/2011), Sciatica (04/19/2013), Sweating (01/21/2012), and Urticaria.  PAST SURGICAL HISTORY: She  has a past surgical history that includes Mastectomy, partial (2009); Abdominal hysterectomy; Radiology with anesthesia (N/A, 05/25/2017); IR PERCUTANEOUS ART THROMBECTOMY/INFUSION INTRACRANIAL INC DIAG ANGIO (05/25/2017); IR ANGIO VERTEBRAL SEL SUBCLAVIAN INNOMINATE UNI R MOD SED (05/25/2017);  and IR ANGIO INTRA EXTRACRAN SEL COM CAROTID INNOMINATE UNI R MOD SED (05/25/2017).  Allergies  Allergen Reactions  . Azithromycin Swelling    Reportedly had reaction September 2017 - caused worsening angioedema. Unclear if it was this or the benzonatate she started at the same time.  . Benzonatate Swelling    Reportedly had reaction September 2017 - caused worsening angioedema. Unclear if it was this or the azithromycin she started at the same time.    No current facility-administered medications on file prior to encounter.    Current Outpatient Medications on File Prior to Encounter  Medication Sig  . CARTIA XT 180 MG 24 hr capsule TAKE ONE CAPSULE BY MOUTH DAILY  . doxepin (SINEQUAN) 10 MG capsule Take 3 capsules (30 mg total) by mouth at bedtime. (Patient taking differently: Take 20-30 mg by mouth at bedtime. )  . EPINEPHrine (EPIPEN 2-PAK) 0.3 mg/0.3 mL IJ SOAJ injection Inject 0.3 mg into the muscle once. Reported on 08/30/2015  . predniSONE (DELTASONE) 10 MG tablet TAKE AS DIRECTED. ALTERNATE 10 MG ONE DAY THEN 5 MG THE NEXT. (Patient taking differently: Take 5-10 mg by mouth See admin instructions. Alternate every over day with 5mg  and 10mg .)  . ranitidine (ZANTAC) 150 MG tablet Take one tablet twice a day. (Patient taking differently: Take 150 mg by mouth daily as needed for heartburn. )  . traMADol (ULTRAM) 50 MG tablet Take 1 tablet (50 mg total) daily as needed by mouth (pain in legs).  . [DISCONTINUED] DOXEPIN HCL PO Take by mouth. 30 mg  Twice  a day    FAMILY HISTORY:  Her indicated that her mother is deceased. She indicated that her father is deceased. She indicated that only one of her two sisters is alive. She indicated that her brother is deceased.   SOCIAL HISTORY: She  reports that she quit smoking about 21 years ago. she has never used smokeless tobacco. She reports that she drinks about 1.2 oz of alcohol per week. She reports that she does not use drugs.  REVIEW OF  SYSTEMS:   Not obtainable  SUBJECTIVE:  As above  VITAL SIGNS: BP 138/72   Pulse 69   Temp 98.5 F (36.9 C) (Axillary)   Resp (!) 23   Ht 5\' 5"  (1.651 m)   Wt 151 lb 14.4 oz (68.9 kg)   SpO2 96%   BMI 25.28 kg/m   HEMODYNAMICS:    VENTILATOR SETTINGS:    INTAKE / OUTPUT: I/O last 3 completed shifts: In: 3507.8 [I.V.:3407.8; IV Piggyback:100] Out: 3400 [Urine:3400]  PHYSICAL EXAMINATION: General: Elderly white female resting comfortably in bed and in no acute distress.   Neuro: She is very quick to respond appropriately to questions.  She has spontaneous eye opening.  She moves the right limbs readily, she is able to flex the left leg but not the left arm.    Pupils are equal. Cardiovascular: S1 and S2 are irregularly irregular  without murmur rub or gallop Lungs: Respirations are unlabored, there is symmetric air movement, there are fewer rhonchi today in the exam is symmetrical.   There are no wheezes. Abdomen: The abdomen is soft without any organomegaly masses or tenderness. Musculoskeletal: There 1+ lower extremity edema.  LABS:  BMET Recent Labs  Lab 05/25/17 1118 05/25/17 1124 05/26/17 0730 05/29/17 0500  NA 139 140 138 141  K 3.9 3.7 2.9* 2.6*  CL 102 101 109 108  CO2 23  --  19* 23  BUN 11 12 7 7   CREATININE 0.74 0.60 0.60 0.51  GLUCOSE 118* 119* 110* 94    Electrolytes Recent Labs  Lab 05/25/17 1118 05/26/17 0730 05/29/17 0500  CALCIUM 9.0 8.1* 8.4*  MG  --   --  1.6*  PHOS  --   --  1.5*    CBC Recent Labs  Lab 05/25/17 1118 05/25/17 1124 05/29/17 0500  WBC 10.6*  --  8.0  HGB 15.7* 16.0* 13.0  HCT 46.6* 47.0* 37.5  PLT 276  --  296    Coag's Recent Labs  Lab 05/25/17 1118  APTT 27  INR 1.03    Sepsis Markers No results for input(s): LATICACIDVEN, PROCALCITON, O2SATVEN in the last 168 hours.  ABG Recent Labs  Lab 05/25/17 1654  PHART 7.366  PCO2ART 38.5  PO2ART 145.0*    Liver Enzymes Recent Labs  Lab  05/25/17 1118  AST 20  ALT 11*  ALKPHOS 64  BILITOT 0.7  ALBUMIN 3.3*    Cardiac Enzymes No results for input(s): TROPONINI, PROBNP in the last 168 hours.  Glucose Recent Labs  Lab 05/25/17 1118  GLUCAP 109*    Imaging Dg Swallowing Func-speech Pathology  Result Date: 05/28/2017 Objective Swallowing Evaluation: Type of Study: MBS-Modified Barium Swallow Study  Patient Details Name: ASHLEIGH LUCKOW MRN: [id number] Date of Birth: 07/08/1923 Today's Date: 05/28/2017 Time: SLP Start Time (ACUTE ONLY): 1145 -SLP Stop Time (ACUTE ONLY): 1215 SLP Time Calculation (min) (ACUTE ONLY): 30 min Past Medical History: Past Medical History: Diagnosis Date . Angioedema  . Depression  . Dysphagia, pharyngoesophageal phase  07/07/2012 . Left hip pain 07/22/2011 . Leg swelling 11/02/2012 . Sciatica 08/21/2011  Started recently in left hip. Takes tramadol. Does not want surgery. Happens once a week or so.   . Sciatica 04/19/2013 . Sweating 01/21/2012  Might relate to Doxepin  . Urticaria  Past Surgical History: Past Surgical History: Procedure Laterality Date . ABDOMINAL HYSTERECTOMY   . IR ANGIO INTRA EXTRACRAN SEL COM CAROTID INNOMINATE UNI R MOD SED  05/25/2017 . IR ANGIO VERTEBRAL SEL SUBCLAVIAN INNOMINATE UNI R MOD SED  05/25/2017 . IR PERCUTANEOUS ART THROMBECTOMY/INFUSION INTRACRANIAL INC DIAG ANGIO  05/25/2017 . MASTECTOMY, PARTIAL  2009 . RADIOLOGY WITH ANESTHESIA N/A 05/25/2017  Procedure: IR WITH ANESTHESIA CODE STROKE;  Surgeon: Luanne Bras, MD;  Location: [facility];  Service: Radiology;  Laterality: N/A; HPI: 84 yo female with chronic atrial fibrillation and neuropathy who was found by daughter with left hemiplegia. MRI showing acute ischemic right MCA territory infarct involving the right basal ganglia, with additional scattered subcentimeter cortical/subcortical infarcts within the overlying right cerebral hemisphere. Single punctate nonhemorrhagic left parietal cortical infarct as above. Unsuccessful  revascularization. Pt intubated 1/22-1/23  Subjective: poor arousability Assessment / Plan / Recommendation CHL IP CLINICAL IMPRESSIONS 05/28/2017 Clinical Impression Pt presents with a moderate oral, mild pharyngeal dysphagia exacerbated by decreased MS.  Pt with impaired oral sensation and motor control, leading to decreased bolus cohesion, anterior spillage, and pocketing in left lateral sulcus.  For liquids, pharyngeal swallow triggers at the level of the pyriforms, with subsequent deep penetration of thins to the level of the vocal cords. Nectar thick liquids observed to penetrate under the upper 1/3 of the epiglottis, ejected from vestibule upon completion of swallow.  There was adequate pharyngeal contraction with limited residue post-swallow.  For now, recommend initiating a conservative diet of dysphagia 1 with nectar-thick liquids; crush meds in puree.  Provide full supervision with meals; check left cheek for pocketing; feed pt only when alert.  SLP Visit Diagnosis Dysphagia, oropharyngeal phase (R13.12) Attention and concentration deficit following -- Frontal lobe and executive function deficit following -- Impact on safety and function Moderate aspiration risk   CHL IP TREATMENT RECOMMENDATION 05/28/2017 Treatment Recommendations Therapy as outlined in treatment plan below   Prognosis 05/28/2017 Prognosis for Safe Diet Advancement Good Barriers to Reach Goals -- Barriers/Prognosis Comment -- CHL IP DIET RECOMMENDATION 05/28/2017 SLP Diet Recommendations Dysphagia 1 (Puree) solids;Nectar thick liquid Liquid Administration via Cup;Straw Medication Administration Crushed with puree Compensations Minimize environmental distractions;Slow rate;Small sips/bites;Lingual sweep for clearance of pocketing Postural Changes --   CHL IP OTHER RECOMMENDATIONS 05/28/2017 Recommended Consults -- Oral Care Recommendations Oral care BID Other Recommendations Order thickener from pharmacy   No flowsheet data found.  CHL IP  FREQUENCY AND DURATION 05/28/2017 Speech Therapy Frequency (ACUTE ONLY) min 3x week Treatment Duration 2 weeks      CHL IP ORAL PHASE 05/28/2017 Oral Phase Impaired Oral - Pudding Teaspoon -- Oral - Pudding Cup -- Oral - Honey Teaspoon -- Oral - Honey Cup -- Oral - Nectar Teaspoon -- Oral - Nectar Cup Left anterior bolus loss;Weak lingual manipulation;Lingual pumping;Left pocketing in lateral sulci;Delayed oral transit;Decreased bolus cohesion Oral - Nectar Straw -- Oral - Thin Teaspoon -- Oral - Thin Cup Left anterior bolus loss;Weak lingual manipulation;Lingual pumping;Left pocketing in lateral sulci;Delayed oral transit;Decreased bolus cohesion Oral - Thin Straw -- Oral - Puree Weak lingual manipulation;Lingual pumping;Left pocketing in lateral sulci;Delayed oral transit;Decreased bolus cohesion Oral - Mech Soft -- Oral - Regular -- Oral - Multi-Consistency --  Oral - Pill -- Oral Phase - Comment --  CHL IP PHARYNGEAL PHASE 05/28/2017 Pharyngeal Phase Impaired Pharyngeal- Pudding Teaspoon -- Pharyngeal -- Pharyngeal- Pudding Cup -- Pharyngeal -- Pharyngeal- Honey Teaspoon -- Pharyngeal -- Pharyngeal- Honey Cup -- Pharyngeal -- Pharyngeal- Nectar Teaspoon -- Pharyngeal -- Pharyngeal- Nectar Cup Delayed swallow initiation-pyriform sinuses Pharyngeal -- Pharyngeal- Nectar Straw -- Pharyngeal -- Pharyngeal- Thin Teaspoon -- Pharyngeal -- Pharyngeal- Thin Cup Delayed swallow initiation-pyriform sinuses;Penetration/Aspiration before swallow;Reduced airway/laryngeal closure Pharyngeal Material enters airway, CONTACTS cords and not ejected out Pharyngeal- Thin Straw Delayed swallow initiation-pyriform sinuses;Reduced airway/laryngeal closure;Penetration/Aspiration before swallow Pharyngeal Material enters airway, CONTACTS cords and not ejected out Pharyngeal- Puree Delayed swallow initiation-vallecula Pharyngeal -- Pharyngeal- Mechanical Soft -- Pharyngeal -- Pharyngeal- Regular -- Pharyngeal -- Pharyngeal- Multi-consistency  -- Pharyngeal -- Pharyngeal- Pill -- Pharyngeal -- Pharyngeal Comment --  No flowsheet data found. No flowsheet data found. Juan Quam Laurice 05/28/2017, 1:55 PM                STUDIES:  MRI  showed an infarct involving the right caudate and patchy right subcortical infarcts on the right.  DISCUSSION:       This is a 84 year old with chronic atrial fibrillation who presented with left hemiplegia.  She was found to have a occluded left carotid which could not be recannulated.  She had a very large penumbra on perfusion study.  Remarkably she has not evolved an extremely large infarct on the follow-up MRI.  ASSESSMENT / PLAN:  PULMONARY Extubation has been well-tolerated.   CARDIOVASCULAR A: She is currently on Neo-Synephrine targeting a systolic pressure between 140 and 160 in the hopes of maintaining collateral flow.  Her level of consciousness seems to be somewhat dependent on her blood pressure.  Midodrin was added yesterday in the hopes of weaning her off of Neo-Synephrine however she is still on a small dose.  I will increase the dose of Midrin today.  She is in chronic atrial fibrillation and for now we are using IV Lopressor for rate control.     GASTROINTESTINAL A: On Pepcid for GI prophylaxis  HEMATOLOGIC A: On subcutaneous heparin for DVT prophylaxis   NEUROLOGIC A: As noted she has suffered from a right basal ganglia infarct with multiple patchy infarcts in the subcortical region of the right hemisphere.  She continues on aspirin and a statin.  This is in the setting of chronic atrial fibrillation and neurology decide on when full dose anticoagulation will be introduced.     Hypokalemia has been addressed.  Lars Masson, MD Pulmonary and [place] Pager: [phone]  05/29/2017, 7:56 AM

## 2017-05-30 ENCOUNTER — Inpatient Hospital Stay (HOSPITAL_COMMUNITY): Payer: Medicare Other

## 2017-05-30 DIAGNOSIS — I9589 Other hypotension: Secondary | ICD-10-CM

## 2017-05-30 DIAGNOSIS — N3000 Acute cystitis without hematuria: Secondary | ICD-10-CM

## 2017-05-30 DIAGNOSIS — I4891 Unspecified atrial fibrillation: Secondary | ICD-10-CM

## 2017-05-30 LAB — CBC
HCT: 40.7 % (ref 36.0–46.0)
HEMOGLOBIN: 13.7 g/dL (ref 12.0–15.0)
MCH: 31.3 pg (ref 26.0–34.0)
MCHC: 33.7 g/dL (ref 30.0–36.0)
MCV: 92.9 fL (ref 78.0–100.0)
PLATELETS: 308 10*3/uL (ref 150–400)
RBC: 4.38 MIL/uL (ref 3.87–5.11)
RDW: 13.6 % (ref 11.5–15.5)
WBC: 6.9 10*3/uL (ref 4.0–10.5)

## 2017-05-30 LAB — BASIC METABOLIC PANEL
ANION GAP: 8 (ref 5–15)
BUN: 8 mg/dL (ref 6–20)
CHLORIDE: 107 mmol/L (ref 101–111)
CO2: 27 mmol/L (ref 22–32)
Calcium: 9.1 mg/dL (ref 8.9–10.3)
Creatinine, Ser: 0.58 mg/dL (ref 0.44–1.00)
Glucose, Bld: 104 mg/dL — ABNORMAL HIGH (ref 65–99)
POTASSIUM: 3.5 mmol/L (ref 3.5–5.1)
SODIUM: 142 mmol/L (ref 135–145)

## 2017-05-30 LAB — URINALYSIS, COMPLETE (UACMP) WITH MICROSCOPIC
BILIRUBIN URINE: NEGATIVE
GLUCOSE, UA: NEGATIVE mg/dL
Ketones, ur: NEGATIVE mg/dL
Nitrite: POSITIVE — AB
PH: 6 (ref 5.0–8.0)
Protein, ur: 30 mg/dL — AB
SQUAMOUS EPITHELIAL / LPF: NONE SEEN
Specific Gravity, Urine: 1.02 (ref 1.005–1.030)

## 2017-05-30 LAB — MAGNESIUM: MAGNESIUM: 2.4 mg/dL (ref 1.7–2.4)

## 2017-05-30 MED ORDER — DEXTROSE 5 % IV SOLN
1.0000 g | INTRAVENOUS | Status: DC
  Administered 2017-05-30 – 2017-05-31 (×2): 1 g via INTRAVENOUS
  Filled 2017-05-30 (×3): qty 10

## 2017-05-30 NOTE — Progress Notes (Signed)
NEUROHOSPITALISTS STROKE TEAM - DAILY PROGRESS NOTE   SUBJECTIVE (INTERVAL HISTORY) One of her daughter is at bedside this morning. Pt off neo and BP stable on midodrine. Transferred to floor last night. This am pt seems more lethargic, still arousable. Had stat CT showed no change. UA showed UTI. CXR unremarkable. Started on Abx. Will follow up with urine culture.     OBJECTIVE Lab Results: CBC:  Recent Labs  Lab 05/25/17 1118 05/25/17 1124 05/29/17 0500 05/30/17 0155  WBC 10.6*  --  8.0 6.9  HGB 15.7* 16.0* 13.0 13.7  HCT 46.6* 47.0* 37.5 40.7  MCV 95.9  --  90.8 92.9  PLT 276  --  296 308   BMP: Recent Labs  Lab 05/25/17 1118 05/25/17 1124 05/26/17 0730 05/29/17 0500 05/30/17 0155  NA 139 140 138 141 142  K 3.9 3.7 2.9* 2.6* 3.5  CL 102 101 109 108 107  CO2 23  --  19* 23 27  GLUCOSE 118* 119* 110* 94 104*  BUN 11 12 7 7 8   CREATININE 0.74 0.60 0.60 0.51 0.58  CALCIUM 9.0  --  8.1* 8.4* 9.1  MG  --   --   --  1.6* 2.4  PHOS  --   --   --  1.5*  --    Liver Function Tests:  Recent Labs  Lab 05/25/17 1118  AST 20  ALT 11*  ALKPHOS 64  BILITOT 0.7  PROT 6.0*  ALBUMIN 3.3*   Coagulation Studies:  No results for input(s): APTT, INR in the last 72 hours.    PHYSICAL EXAM Temp:  [97.9 F (36.6 C)-98.9 F (37.2 C)] 97.9 F (36.6 C) (01/27 1354) Pulse Rate:  [72-94] 87 (01/27 1354) Resp:  [16-26] 18 (01/27 1354) BP: (114-146)/(50-85) 120/69 (01/27 1354) SpO2:  [96 %-99 %] 97 % (01/27 1354) Weight:  [151 lb 10.8 oz (68.8 kg)] 151 lb 10.8 oz (68.8 kg) (01/26 2214) General - thin built, well developed, lethargic HEENT-  Normocephalic   Cardiovascular - Irregular rate and rhythm  Respiratory - Lungs clear bilaterally. No wheezing. Abdomen - soft and non-tender, BS normal Extremities- no edema or cyanosis Neurological exam- lethargic but opens eyes to voice and nods appropriately, follows simple  comands Cranial nerves: Pupils equal round reactive to light, right gaze preference able to cross midline to look at the left, left homonymous hemianopsia, left lower facial weakness, decreased sensation in the left side of the face. Motor exam: 0/5 left upper extremity, 1/5 left lower extremity, 4/5 right upper and right lower extremities due to lethargy.+ withdrawal to pain on left Sensory exam: Dense loss with sensory neglect on the left side. Coordination: no ataxia on the right Gait exam - not tested  IMAGING: I have personally reviewed the radiological images below and agree with the radiology interpretations.  Ct Head Wo Contrast 05/30/2017 IMPRESSION: Acute infarct of right basal ganglia and anterior internal capsule. Additional smaller acute infarcts on 05/25/2017 brain MRI are not visible by CT. No hemorrhagic conversion or detected progression.   DG Chest Port 1 View 05/30/2017 IMPRESSION: Very small bilateral pleural effusions and mild left basilar atelectasis.  Ct Angio Head/Neck W Or Wo Contrast 05/25/2017 IMPRESSION:  1. The right internal carotid artery is occluded at the carotid bifurcation.  2. The fetal type right posterior cerebral artery fills via a small right P1 segment to the right ICA terminus.  3. Right M1 occlusion with reconstitution of right MCA branch vessels beyond the bifurcation  suggesting collateral flow.  4. Right A1 segment flow is likely retrograde.  5. No significant left-sided stenosis.  6. Aortic Atherosclerosis (ICD10-I70.0). No significant stenosis or aneurysm at the aortic arch. 7. Multilevel spondylosis of the cervical spine.   Mr Brain Wo Contrast 05/25/2017  IMPRESSION:  1. Acute ischemic right MCA territory infarct involving the right basal ganglia, with additional scattered subcentimeter cortical subcortical infarcts within the overlying right cerebral hemisphere. No associated hemorrhage or mass effect.  2. Single punctate nonhemorrhagic  left parietal cortical infarct as above.  3. Abnormal flow voids within the right ICA and M1 segment, consistent with previously identified occlusion.  4. Age-related atrophy with mild to moderate chronic small vessel ischemic disease.   Ct Cerebral Perfusion W Contrast Result Date: 05/25/2017 IMPRESSION:  1. Large area of ischemic penumbra involving the right MCA territory with estimated volume of 130 mL and small core infarct involving the basal ganglia. The estimated core volume is 8 mm. 2. The right MCA ischemia corresponds to right ICA and M1 occlusions.   Ct Head Code Stroke Wo Contrast 05/25/2017   IMPRESSION:  1. Hyperdense right MCA compatible with thrombosis. No acute infarct or hemorrhage  2. ASPECTS is 10   Echocardiogram                           05/26/2017 Study Conclusions - Left ventricle: The cavity size was normal. There was mild focal   basal hypertrophy of the septum. Systolic function was normal.   The estimated ejection fraction was in the range of 55% to 60%.   Wall motion was normal; there were no regional wall motion   abnormalities. Impressions: - Normal LV systolic function; trace MR; mild TR.                 IMPRESSION: [name] is a 84 y.o. female with PMH of atrial fibrillation not on anticoagulation, peripheral neuropathy functionally dependent at baseline lives at home in her usual state of health last known normal at 7 PM last night was found to have left-sided weakness and right gaze preference along with dysarthria and left facial weakness. NIH stroke scale 19 for a right MCA syndrome.  Noncontrast CT of the head shows no bleed.  CT aspects 728.  CT angiogram head and neck showed right ICA occlusion and right MCA occlusion in the M1 segment.  CT perfusion profile favorable for emergent intervention because of a small 8 cc core and large ischemic penumbra 130 cc.    Acute ischemic right MCA territory infarct -Right basal ganglia, with  additional scattered subcentimeter cortical subcortical infarcts within the overlying right cerebral hemisphere Occlusion and stenosis of R carotid artery  Suspected Etiology: cardioembolic source, AFIB not on AC therapy Resultant Symptoms: Left sided defcits Stroke Risk Factors: atrial fibrillation and hypertension Other Stroke Risk Factors: Advanced age,   PROCEDURES:  05/25/2017 by Dr Estanislado Pandy s/p revascularization attempt, unable to achieve reperfusion.  Outstanding Stroke Work-up Studies:     Echocardiogram:Normal LV systolic function; trace MR; mild TR  PLAN  05/30/2017: Continue Aspirin/ Statin Medical management so patient can go to CIR Frequent neuro checks Telemetry monitoring PT/OT/SLP Case Management /MSW Ongoing aggressive stroke risk factor management Patient's family will be counseled to be compliant with her antithrombotic medications Patient's family will be counseled on Lifestyle modifications including, Diet, Exercise, and Stress Follow up with [facility] in 6 weeks [place]: SLP  swallow evaluation - now on dysphagia 1 diet with nectar thick liquids Aspiration Precautions in progress  MEDICAL ISSUES: ACUTE RESPIRATORY FAILURE: [place] Management per CCM team - Appreciate assistance Extubated   AFIB, CHRONIC: Cardizem started for rate control Will likely need AC therapy, defer to Dr. Erlinda Hong but due to size of stroke prefer to wait until 1-2 weeks afterwards  HEME Hemoglobin 16 -Monitor -transfuse for hgb < 7 -Trend PT/PTT/INR  Fluid/Electrolyte Disorders-  Hypokalemia Check labs -> K 2.6 -> 3.5   Mg 1.6 -> supplement -> 2.4 Replete per ICU protocol  ID Possible Aspiration PNA -CXR -NPO -Monitor  R/O UTI: Cultures pending U/A - pending  HYPERTENSION: Stable SBP goal less than 180/90  Nicardipine drip, Labetolol PRN Long term BP goal normotensive. May slowly restart home B/P medications after 48 hours Home  Meds: Cartia XT  HYPERLIPIDEMIA:    Component Value Date/Time   CHOL 141 05/26/2017 0414   TRIG 62 05/26/2017 0414   HDL 55 05/26/2017 0414   CHOLHDL 2.6 05/26/2017 0414   VLDL 12 05/26/2017 0414   LDLCALC 74 05/26/2017 0414  Home Meds:  NONE LDL  goal < 70 Started on Lipitor to 10 mg daily Continue statin at discharge  R/O DIABETES: Lab Results  Component Value Date   HGBA1C 5.5 05/26/2017  HgbA1c goal < 7.0 Continue CBG monitoring and SSI to maintain glucose 140-180 mg/dl  Other Active Problems: Active Problems:   Acute embolic stroke [facility])   Middle cerebral artery embolism, right   Hypokalemia   Left hemiplegia [facility])    Hospital day # 5 VTE prophylaxis: Heparin Diet : Fall precautions DIET - DYS 1 Room service appropriate? Yes; Fluid consistency: Nectar Thick   FAMILY UPDATES: family at bedside  TEAM UPDATES: Rosalin Hawking, MD STATUS:  FULL   Prior Home Stroke Medications:  No antithrombotic  Discharge Stroke Meds:  Please discharge patient on TBD   Disposition: 01-Home or Self Care Therapy Recs:               CIR Home Equipment:         PENDING Follow Up:  Follow-up Information    Garvin Fila, MD. Schedule an appointment as soon as possible for a visit in 6 week(s).   Specialties:  Neurology, Radiology Contact information: [address] [place] [address] [phone]          Leeanne Rio, MD -PCP Follow up in 1-2 weeks      ATTENDING NOTE: I reviewed above note and agree with the assessment and plan. I have made any additions or clarifications directly to the above note. Pt was seen and examined.   84 year old female with history of A. fib not on anticoagulation, angioedema on prednisone admitted for left-sided weakness, right gaze, left facial droop and slurred speech.  CT showed right MCA hyperdense.  CTA head and neck showed right ICA occlusion.  CTP showed large penumbra.  Patient taken to IR, however failed to  revascularize.  MRI showed right BG, CR, caudate head infarcts as well as right scattered MCA punctate infarcts.  EF 55-60%.  A1c 5.5 and LDL 74.  Currently on aspirin and Lipitor. Her stroke is likely due to afib not on [facility]. Will start eliquis in 2-3 days if no procedure planned.  Patient has profound hypo-kalemia improved. This am 3.5. Continue supplement. Past swallow, on Midrin and BP stable.  Heart rate controlled by metoprolol and Cardizem.  PT OT recommend CIR. However,  more lethargic today, UA showed UTI, put on rocephin. Repeat CT head no change. Follow up with urine culture.   Rosalin Hawking, MD PhD Stroke Neurology 05/30/2017 5:33 PM  I spent  35 minutes in total face-to-face time with the patient, more than 50% of which was spent in counseling and coordination of care, reviewing test results, images and medication, and discussing the diagnosis of stroke and afib and UTI, treatment plan and potential prognosis. This patient's care requiresreview of multiple databases, neurological assessment, discussion with family, other specialists and medical decision making of high complexity.   To contact Stroke Continuity provider, please refer to [email or web address] After hours, contact General Neurology

## 2017-05-30 NOTE — Plan of Care (Signed)
  Education: Knowledge of disease or condition will improve 05/30/2017 0005 - Progressing by Bronson Curb, RN Knowledge of secondary prevention will improve 05/30/2017 0005 - Progressing by Bronson Curb, RN Knowledge of patient specific risk factors addressed and post discharge goals established will improve 05/30/2017 0005 - Progressing by Iola Turri, Lanetta Inch, RN

## 2017-05-30 NOTE — Progress Notes (Addendum)
Patient is very confused.  Patient is alert to self and that is in the hospital, however patient is seeing people, animals, and objects that are not in the room.  Patient refused her medication.  RN crushed medication and placed in apple sauce.  Patient advised that if medication was in the apple sauce she going to spit it back out.  However patient refused to even try the apple sauce.  RN will continue to monitor patient

## 2017-05-30 NOTE — Progress Notes (Signed)
Pharmacy Antibiotic Note  Chloe Lopez is a 84 y.o. female admitted on 05/25/2017 with L-sided weakness and was found to have a new CVA. Pharmacy has been consulted to start Rocephin for empiric UTI coverage.  Plan: 1. Rocephin 1g IV every 24 hours 2. Pharmacy will sign off as no further dose adjustments are expected at this time  Height: 5\' 5"  (165.1 cm) Weight: 151 lb 10.8 oz (68.8 kg) IBW/kg (Calculated) : 57  Temp (24hrs), Avg:98.3 F (36.8 C), Min:97.9 F (36.6 C), Max:98.9 F (37.2 C)  Recent Labs  Lab 05/25/17 1118 05/25/17 1124 05/26/17 0730 05/29/17 0500 05/30/17 0155  WBC 10.6*  --   --  8.0 6.9  CREATININE 0.74 0.60 0.60 0.51 0.58    Estimated Creatinine Clearance: 43.7 mL/min (by C-G formula based on SCr of 0.58 mg/dL).    Allergies  Allergen Reactions  . Azithromycin Swelling    Reportedly had reaction September 2017 - caused worsening angioedema. Unclear if it was this or the benzonatate she started at the same time.  . Benzonatate Swelling    Reportedly had reaction September 2017 - caused worsening angioedema. Unclear if it was this or the azithromycin she started at the same time.    Thank you for allowing pharmacy to be a part of this patient's care.  Lawson Radar 05/30/2017 5:24 PM

## 2017-05-31 LAB — CBC
HEMATOCRIT: 41.1 % (ref 36.0–46.0)
HEMOGLOBIN: 13.5 g/dL (ref 12.0–15.0)
MCH: 31.1 pg (ref 26.0–34.0)
MCHC: 32.8 g/dL (ref 30.0–36.0)
MCV: 94.7 fL (ref 78.0–100.0)
Platelets: 303 10*3/uL (ref 150–400)
RBC: 4.34 MIL/uL (ref 3.87–5.11)
RDW: 14.2 % (ref 11.5–15.5)
WBC: 9.9 10*3/uL (ref 4.0–10.5)

## 2017-05-31 LAB — BASIC METABOLIC PANEL
ANION GAP: 11 (ref 5–15)
BUN: 11 mg/dL (ref 6–20)
CALCIUM: 8.8 mg/dL — AB (ref 8.9–10.3)
CHLORIDE: 107 mmol/L (ref 101–111)
CO2: 23 mmol/L (ref 22–32)
Creatinine, Ser: 0.53 mg/dL (ref 0.44–1.00)
GFR calc non Af Amer: >60 mL/min (ref >60)
GLUCOSE: 80 mg/dL (ref 65–99)
POTASSIUM: 3.5 mmol/L (ref 3.5–5.1)
Sodium: 141 mmol/L (ref 135–145)

## 2017-05-31 MED ORDER — MIDODRINE HCL 5 MG PO TABS
10.0000 mg | ORAL_TABLET | Freq: Three times a day (TID) | ORAL | Status: DC
  Administered 2017-05-31 – 2017-06-03 (×8): 10 mg via ORAL
  Filled 2017-05-31 (×9): qty 2

## 2017-05-31 NOTE — Evaluation (Addendum)
Speech Language Pathology Evaluation Patient Details Name: Chloe Lopez MRN: [id number] DOB: 24-Sep-1923 Today's Date: 05/31/2017 Time: 8938-1017 SLP Time Calculation (min) (ACUTE ONLY): 25 min  Problem List:  Patient Active Problem List   Diagnosis Date Noted  . Hypokalemia   . Left hemiplegia ([place])   . Acute embolic stroke ([place]) 51/06/5850  . Middle cerebral artery embolism, right 05/25/2017  . Atrial fibrillation ([place]) 08/25/2016  . Seborrheic keratoses 08/25/2016  . Age-related cognitive decline 06/04/2016  . Current chronic use of systemic steroids 06/04/2016  . 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 Varicose veins of both lower extremities with complications 77/82/4235  . Dysthymia 07/15/2015  . Open angle with borderline findings, low risk, bilateral 03/25/2015  . Peripheral neuropathy 09/10/2014  . Venous (peripheral) insufficiency 11/10/2012  . Hearing loss 11/10/2012  . AMD (age related macular degeneration) 11/23/2011  . Pseudophakia 11/23/2011  . History of breast cancer in female 03/25/2009  . URTICARIA-HIVES/Angioedema 07/01/2006  . Osteopenia 07/01/2006   Past Medical History:  Past Medical History:  Diagnosis Date  . Angioedema   . Depression   . Dysphagia, pharyngoesophageal phase 07/07/2012  . Left hip pain 07/22/2011  . Leg swelling 11/02/2012  . Sciatica 08/21/2011   Started recently in left hip. Takes tramadol. Does not want surgery. Happens once a week or so.    . Sciatica 04/19/2013  . Sweating 01/21/2012   Might relate to Doxepin   . Urticaria    Past Surgical History:  Past Surgical History:  Procedure Laterality Date  . ABDOMINAL HYSTERECTOMY    . IR ANGIO INTRA EXTRACRAN SEL COM CAROTID INNOMINATE UNI R MOD SED  05/25/2017  . IR ANGIO VERTEBRAL SEL SUBCLAVIAN INNOMINATE UNI R MOD SED  05/25/2017  . IR PERCUTANEOUS ART THROMBECTOMY/INFUSION INTRACRANIAL INC DIAG ANGIO  05/25/2017  . MASTECTOMY, PARTIAL  2009  . RADIOLOGY WITH  ANESTHESIA N/A 05/25/2017   Procedure: IR WITH ANESTHESIA CODE STROKE;  Surgeon: Luanne Bras, MD;  Location: [place];  Service: Radiology;  Laterality: N/A;   HPI:  84 yo female with chronic atrial fibrillation and neuropathy who was found by daughter with left hemiplegia. MRI showing acute ischemic right MCA territory infarct involving the right basal ganglia, with additional scattered subcentimeter cortical/subcortical infarcts within the overlying right cerebral hemisphere. Single punctate nonhemorrhagic left parietal cortical infarct as above. Unsuccessful revascularization. Pt intubated 1/22-1/23   Assessment / Plan / Recommendation Clinical Impression  The Mini-Mental State exam was administered. Pt scored 23/30 (n=26+/30), indicating mild cognitive impairment. Points were lost on orientation (inaccurate for time, situation, and city), following 3 step directions (accurate for 2/3 steps), and design copying. 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 Recommend follow up with ST at next venue for cognitive impairment.   Pt tolerating current diet (puree/nectar thick liquid). No overt s/s aspiration observed or reported. ST will continue to follow acutely for assesment of diet tolerance and readiness to advance.    SLP Assessment  SLP Recommendation/Assessment: Patient needs continued Speech Lanaguage Pathology Services SLP Visit Diagnosis: Attention and concentration deficit Attention and concentration deficit following: Cerebral infarction    Follow Up Recommendations  Home health SLP;24 hour supervision/assistance;Inpatient Rehab;Outpatient SLP;Skilled Nursing facility    Frequency and Duration min 3x week  2 weeks      SLP Evaluation Cognition  Overall Cognitive Status: No family/caregiver present to determine baseline cognitive functioning Arousal/Alertness: Awake/alert Orientation Level: Oriented  to person;Oriented to place;Disoriented to time;Disoriented to situation Attention: Focused;Sustained Focused Attention: Appears intact Sustained Attention: Impaired Sustained Attention Impairment: Verbal basic Memory: (immediate and delayed recall of 3/3 words) Awareness: Impaired Comments: 23/30 on MMSE       Comprehension  Auditory Comprehension Overall Auditory Comprehension: Appears within functional limits for tasks assessed    Expression Expression Primary Mode of Expression: Verbal Verbal Expression Overall Verbal Expression: Appears within functional limits for tasks assessed Written Expression Dominant Hand: Right   Oral / Motor  Oral Motor/Sensory Function Overall Oral Motor/Sensory Function: Moderate impairment Facial ROM: Suspected CN VII (facial) dysfunction;Reduced left Facial Symmetry: Suspected CN VII (facial) dysfunction;Abnormal symmetry left Facial Sensation: Reduced left;Suspected CN V (Trigeminal) dysfunction Lingual Symmetry: Within Functional Limits Motor Speech Overall Motor Speech: Appears within functional limits for tasks assessed   GO                   Chloe Lopez B. Chloe Lopez [facility], CCC-SLP Speech Language Pathologist [phone]  Chloe Lopez 05/31/2017, 10:18 AM

## 2017-05-31 NOTE — Progress Notes (Signed)
NEUROHOSPITALISTS STROKE TEAM - DAILY PROGRESS NOTE   SUBJECTIVE (INTERVAL HISTORY) No family at bedside this morning. BP stable on midodrine overnight. UA showed UTI yesterday and started on IV Rocephin. Remains afebrile. Urine culture remains pending. No acute events reported overnight. Plan for discharged to SNF in AM    OBJECTIVE Lab Results: CBC:  Recent Labs  Lab 05/29/17 0500 05/30/17 0155 05/31/17 0451  WBC 8.0 6.9 9.9  HGB 13.0 13.7 13.5  HCT 37.5 40.7 41.1  MCV 90.8 92.9 94.7  PLT 296 308 303   BMP: Recent Labs  Lab 05/25/17 1118 05/25/17 1124 05/26/17 0730 05/29/17 0500 05/30/17 0155 05/31/17 0451  NA 139 140 138 141 142 141  K 3.9 3.7 2.9* 2.6* 3.5 3.5  CL 102 101 109 108 107 107  CO2 23  --  19* 23 27 23   GLUCOSE 118* 119* 110* 94 104* 80  BUN 11 12 7 7 8 11   CREATININE 0.74 0.60 0.60 0.51 0.58 0.53  CALCIUM 9.0  --  8.1* 8.4* 9.1 8.8*  MG  --   --   --  1.6* 2.4  --   PHOS  --   --   --  1.5*  --   --    Liver Function Tests:  Recent Labs  Lab 05/25/17 1118  AST 20  ALT 11*  ALKPHOS 64  BILITOT 0.7  PROT 6.0*  ALBUMIN 3.3*   Coagulation Studies:  No results for input(s): APTT, INR in the last 72 hours.    PHYSICAL EXAM Temp:  [97.7 F (36.5 C)-99.7 F (37.6 C)] 98.8 F (37.1 C) (01/28 1400) Pulse Rate:  [72-84] 80 (01/28 1400) Resp:  [18] 18 (01/28 1400) BP: (107-127)/(65-88) 115/69 (01/28 1400) SpO2:  [94 %-99 %] 96 % (01/28 1400) General - thin built, well developed, lethargic HEENT-  Normocephalic   Cardiovascular - Irregular rate and rhythm  Respiratory - Lungs clear bilaterally. No wheezing. Abdomen - soft and non-tender, BS normal Extremities- no edema or cyanosis Neurological exam- lethargic but opens eyes to voice and nods appropriately, follows simple comands Cranial nerves: Pupils equal round reactive to light, right gaze preference able to cross midline to look at  the left, left homonymous hemianopsia, left lower facial weakness, decreased sensation in the left side of the face. Motor exam: 0/5 left upper extremity, 1/5 left lower extremity, 4/5 right upper and right lower extremities due to lethargy.+ withdrawal to pain on left Sensory exam: Dense loss with sensory neglect on the left side. Coordination: no ataxia on the right Gait exam - not tested  IMAGING: I have personally reviewed the radiological images below and agree with the radiology interpretations.  Ct Head Wo Contrast 05/30/2017 IMPRESSION: Acute infarct of right basal ganglia and anterior internal capsule. Additional smaller acute infarcts on 05/25/2017 brain MRI are not visible by CT. No hemorrhagic conversion or detected progression.   DG Chest Port 1 View 05/30/2017 IMPRESSION: Very small bilateral pleural effusions and mild left basilar atelectasis.  Ct Angio Head/Neck W Or Wo Contrast 05/25/2017 IMPRESSION:  1. The right internal carotid artery is occluded at the carotid bifurcation.  2. The fetal type right posterior cerebral artery fills via a small right P1 segment to the right ICA terminus.  3. Right M1 occlusion with reconstitution of right MCA branch vessels beyond the bifurcation suggesting collateral flow.  4. Right A1 segment flow is likely retrograde.  5. No significant left-sided stenosis.  6. Aortic Atherosclerosis (ICD10-I70.0). No significant stenosis  or aneurysm at the aortic arch. 7. Multilevel spondylosis of the cervical spine.   Mr Brain Wo Contrast 05/25/2017  IMPRESSION:  1. Acute ischemic right MCA territory infarct involving the right basal ganglia, with additional scattered subcentimeter cortical subcortical infarcts within the overlying right cerebral hemisphere. No associated hemorrhage or mass effect.  2. Single punctate nonhemorrhagic left parietal cortical infarct as above.  3. Abnormal flow voids within the right ICA and M1 segment, consistent with  previously identified occlusion.  4. Age-related atrophy with mild to moderate chronic small vessel ischemic disease.   Ct Cerebral Perfusion W Contrast Result Date: 05/25/2017 IMPRESSION:  1. Large area of ischemic penumbra involving the right MCA territory with estimated volume of 130 mL and small core infarct involving the basal ganglia. The estimated core volume is 8 mm. 2. The right MCA ischemia corresponds to right ICA and M1 occlusions.   Ct Head Code Stroke Wo Contrast 05/25/2017   IMPRESSION:  1. Hyperdense right MCA compatible with thrombosis. No acute infarct or hemorrhage  2. ASPECTS is 10   Echocardiogram                           05/26/2017 Study Conclusions - Left ventricle: The cavity size was normal. There was mild focal   basal hypertrophy of the septum. Systolic function was normal.   The estimated ejection fraction was in the range of 55% to 60%.   Wall motion was normal; there were no regional wall motion   abnormalities. Impressions: - Normal LV systolic function; trace MR; mild TR.                 IMPRESSION: Chloe Lopez is a 84 y.o. female with PMH of atrial fibrillation not on anticoagulation, peripheral neuropathy functionally dependent at baseline lives at home in her usual state of health last known normal at 7 PM last night was found to have left-sided weakness and right gaze preference along with dysarthria and left facial weakness. NIH stroke scale 19 for a right MCA syndrome.  Noncontrast CT of the head shows no bleed. CT aspects 728.  CT angiogram head and neck showed right ICA occlusion and right MCA occlusion in the M1 segment.  CT perfusion profile favorable for emergent intervention because of a small 8 cc core and large ischemic penumbra 130 cc.  Her stroke is likely due to afib not on [facility].   Acute ischemic right MCA territory infarct -Right basal ganglia, with additional scattered subcentimeter cortical subcortical infarcts within the overlying  right cerebral hemisphere Occlusion and stenosis of R carotid artery  Suspected Etiology: cardioembolic source, AFIB not on AC therapy Resultant Symptoms: Left sided defcits Stroke Risk Factors: atrial fibrillation and hypertension Other Stroke Risk Factors: Advanced age,   PROCEDURES:  05/25/2017 by Dr Estanislado Pandy s/p revascularization attempt, unable to achieve reperfusion.  Outstanding Stroke Work-up Studies:    Work up completed at this time  PLAN  05/31/2017: Continue Aspirin/ Statin Frequent neuro checks Telemetry monitoring PT/OT/SLP Case Management /MSW Ongoing aggressive stroke risk factor management Patient's family counseled to be compliant with her antithrombotic medications Patient's family counseled on Lifestyle modifications including, Diet, Exercise, and Stress Follow up with [facility] in 6 weeks [place]: SLP swallow evaluation - now on dysphagia 1 diet with nectar thick liquids Aspiration Precautions in progress  MEDICAL ISSUES:  AFIB, CHRONIC: Cardizem started for rate control Will likely need AC therapy,  1-2 weeks after stroke, plan for Eliquis ASA for now  Fluid/Electrolyte Disorders-    K 2.6 -> 3.5   Mg 1.6 -> supplement -> 2.4 Repletion completed  ID Possible Aspiration PNA -CXR - Very small bilateral pleural effusions and mild left basilar atelectasis. Remains afebrile Will order incentive spirometry  R/O UTI: Continue IV Rocephin for now Cultures pending - PENDING  HYPERTENSION: Stable SBP goal less than 180/90  Long term BP goal normotensive. May slowly restart home B/P medications after 48 hours Home Meds: Cardizem, Lopressor  HYPERLIPIDEMIA:    Component Value Date/Time   CHOL 141 05/26/2017 0414   TRIG 62 05/26/2017 0414   HDL 55 05/26/2017 0414   CHOLHDL 2.6 05/26/2017 0414   VLDL 12 05/26/2017 0414   LDLCALC 74 05/26/2017 0414  Home Meds:  NONE LDL  goal < 70 Started on Lipitor to 10 mg  daily Continue statin at discharge  R/O DIABETES: Lab Results  Component Value Date   HGBA1C 5.5 05/26/2017  HgbA1c goal < 7.0 Continue CBG monitoring and SSI to maintain glucose 140-180 mg/dl  Other Active Problems: Active Problems:   Acute embolic stroke [facility])   Middle cerebral artery embolism, right   Hypokalemia   Left hemiplegia [facility])    Hospital day # 6 VTE prophylaxis: Heparin Diet : Fall precautions DIET - DYS 1 Room service appropriate? Yes; Fluid consistency: Nectar Thick   FAMILY UPDATES: No family at bedside  TEAM UPDATES: Rosalin Hawking, MD STATUS:  FULL   Prior Home Stroke Medications:  No antithrombotic  Discharge Stroke Meds:  Please discharge patient on ASA 325 mg daily  Disposition: 01-Home or Self Care Therapy Recs:               SNF Follow Up:  Follow-up Information    Garvin Fila, MD. Schedule an appointment as soon as possible for a visit in 6 week(s).   Specialties:  Neurology, Radiology Contact information: [address] [place] [address] [phone]          Leeanne Rio, MD -PCP Follow up in 1-2 weeks     Assessment & plan discussed with with attending physician and they are in agreement.    Renie Ora Stroke Neurology Team 05/31/2017 2:16 PM  ATTENDING NOTE: I reviewed above note and agree with the assessment and plan. I have made any additions or clarifications directly to the above note. Pt was seen and examined.   84 year old female with history of A. fib not on anticoagulation, angioedema on prednisone admitted for left-sided weakness, right gaze, left facial droop and slurred speech.  CT showed right MCA hyperdense.  CTA head and neck showed right ICA occlusion.  CTP showed large penumbra.  Patient taken to IR, however failed to revascularize.  MRI showed right BG, CR, caudate head infarcts as well as right scattered MCA punctate infarcts.  EF 55-60%.  A1c 5.5 and LDL 74.  Currently on  aspirin and Lipitor. Her stroke is likely due to afib not on [facility]. Will start eliquis tomorrow for stroke prevention.  Patient hypokalemia improved, today 3.5. Continue supplement. BP stable on the low side, will increase midodrine dose.  Heart rate controlled by metoprolol and Cardizem.  Yesterday lethargic UA showed UTI, put on rocephin. Repeat CT head no change. Urine culture pending. today pt still lethargic, but improved from yesterday. Neuro stable over night. PT OT recommend SNF.   Rosalin Hawking, MD PhD Stroke Neurology 05/31/2017 5:21 PM  To contact Stroke Continuity provider, please refer to [email or web address] After hours, contact General Neurology

## 2017-05-31 NOTE — Progress Notes (Signed)
OT Cancellation    05/31/17 1600  OT Visit Information  Last OT Received On 05/31/17  Reason Eval/Treat Not Completed Fatigue/lethargy limiting ability to participate. Upon arrival, pt supine in bed and sleeping. Attempt to wake pt and unsuccessful. Discussed with RN, and will return for OT session tomorrow.    Adin, OTR/L Acute Rehab Pager: [phone] Office: [phone]

## 2017-05-31 NOTE — Progress Notes (Signed)
Nutrition Follow-up  DOCUMENTATION CODES:   Not applicable  INTERVENTION:   Magic cup TID with meals, each supplement provides 290 kcal and 9 grams of protein  NUTRITION DIAGNOSIS:   Inadequate oral intake related to lethargy/confusion as evidenced by estimated needs.  Progressing  GOAL:   Patient will meet greater than or equal to 90% of their needs  Progressing  MONITOR:   PO intake, Supplement acceptance, Labs, I & O's  ASSESSMENT:   Pt with PMH of depression, chronic afib who lives alone and was admitted with occluded L carotid which could not be recannulated, pt with L hemiplegia.  Pt lethargic and unable to answer RD questions at visit. Per chart, pt remains confused. Pt's diet advanced 05/28/17 to Dysphagia 1 with Nectar Thick Liquids. Pt with poor intake, consuming between 0-25% of meals at this time.   RD will continue to monitor.   Labs reviewed; Phosphorus 1.5 (01/26) Medications reviewed; Prednisone, Pepcid  Diet Order:  Fall precautions DIET - DYS 1 Room service appropriate? Yes; Fluid consistency: Nectar Thick  EDUCATION NEEDS:   No education needs have been identified at this time  Skin:  Skin Assessment: Reviewed RN Assessment  Last BM:  05/30/17  Height:   Ht Readings from Last 1 Encounters:  05/29/17 5\' 5"  (1.651 m)   Weight:   Wt Readings from Last 1 Encounters:  05/29/17 151 lb 10.8 oz (68.8 kg)   Ideal Body Weight:  56.8 kg  BMI:  Body mass index is 25.24 kg/m.  Estimated Nutritional Needs:   Kcal:  1350-1500  Protein:  70-85 grams  Fluid:  > 1.5 L/day  [place] Ranger, MS, RDN, LDN 05/31/2017 1:01 PM

## 2017-05-31 NOTE — Progress Notes (Signed)
I met with pt's local daughter at bedside to discuss pt's current progress. We continue to recommend SNF at this time and daughter is in agreement. I have updated SW and we will sign off at this time. [phone]

## 2017-05-31 NOTE — Clinical Social Work Note (Signed)
Clinical Social Work Assessment  Patient Details  Name: Chloe Lopez MRN: [id number] Date of Birth: 04/23/1924  Date of referral:  05/31/17               Reason for consult:  Facility Placement                Permission sought to share information with:  Facility Art therapist granted to share information::  No(disoriented)  Name::     Ship broker::  SNF  Relationship::  dtr  Contact Information:     Housing/Transportation Living arrangements for the past 2 months:  Apartment Source of Information:  Adult Children Patient Interpreter Needed:  None Criminal Activity/Legal Involvement Pertinent to Current Situation/Hospitalization:  No - Comment as needed Significant Relationships:  Adult Children Lives with:  Self Do you feel safe going back to the place where you live?  No Need for family participation in patient care:  Yes (Comment)(decision making)  Care giving concerns:  Pt lives at home alone- now needing max assist and would not be safe to return to living alone at this time.   Social Worker assessment / plan:  CSW spoke with pt dtr, Wells Guiles, regarding plan for time of DC.  Explained SNF as alternative option to CIR since CIR does not think patient appropriate for program at this time.  Discussed home health as well and informed how they can look into home health aids.  Employment status:  Retired Forensic scientist:  Medicare PT Recommendations:  [place] / Referral to community resources:  [place]  Patient/Family's Response to care:  Pt dtr unsure about SNF at this time.  Would prefer CIR but understands CIR won't take if no one will be with patient at DC- explained that sister [place] is considering having patient live with her and would like CIR updated that they might have 24 hour supervision at DC.  Dtr hesitant about SNF due to family friend dislike of SNF  facilities.  Patient/Family's Understanding of and Emotional Response to Diagnosis, Current Treatment, and Prognosis:  Dtr seems to have good understanding of current condition but seems overwhelmed by situation and pressure to make the right decision.  Emotional Assessment Appearance:  Appears stated age Attitude/Demeanor/Rapport:  Unable to Assess Affect (typically observed):  Unable to Assess Orientation:  Oriented to Self, Oriented to Place Alcohol / Substance use:  Not Applicable Psych involvement (Current and /or in the community):  No (Comment)  Discharge Needs  Concerns to be addressed:  Care Coordination Readmission within the last 30 days:  No Current discharge risk:  Physical Impairment Barriers to Discharge:  Continued Medical Work up   Jorge Ny, LCSW 05/31/2017, 4:16 PM

## 2017-05-31 NOTE — Progress Notes (Signed)
PT Cancellation Note  Patient Details Name: Chloe Lopez MRN: [id number] DOB: 1923/09/18   Cancelled Treatment:    Reason Eval/Treat Not Completed: Fatigue/lethargy limiting ability to participate Attempted to work with patient at 1640, patient sleeping heavily and unable to be aroused to participate in therapy despite varied attempts. Per RN last awake around 1300 to take medication. PT will visit tomorrow.   Reinaldo Berber, PT, DPT Acute Rehab Services Pager: [phone]     Reinaldo Berber 05/31/2017, 4:43 PM

## 2017-05-31 NOTE — Care Management Important Message (Signed)
Important Message  Patient Details  Name: Chloe Lopez MRN: [id number] Date of Birth: 10/08/1923   Medicare Important Message Given:  Yes Due to illness patient could not sign.  Unsign copy let at bedside Orbie Pyo 05/31/2017, 3:26 PM

## 2017-05-31 NOTE — NC FL2 (Signed)
[place] LEVEL OF CARE SCREENING TOOL     IDENTIFICATION  Patient Name: Chloe Lopez Birthdate: 04-02-1924 Sex: female Admission Date (Current Location): 05/25/2017  [facility] and [place] Number:  Herbalist and Address:  [facility] [place], [place] [address] [place], [place] [address]      Provider Number: [id number]  Attending Physician Name and Address:  Rosalin Hawking, MD  Relative Name and Phone Number:       Current Level of Care: Hospital Recommended Level of Care: [place] Prior Approval Number:    Date Approved/Denied:   PASRR Number: [id number]  Discharge Plan: SNF    Current Diagnoses: Patient Active Problem List   Diagnosis Date Noted  . Hypokalemia   . Left hemiplegia ([place])   . Acute embolic stroke ([place]) 41/63/8453  . Middle cerebral artery embolism, right 05/25/2017  . Atrial fibrillation ([place]) 08/25/2016  . Seborrheic keratoses 08/25/2016  . Age-related cognitive decline 06/04/2016  . Current chronic use of systemic steroids 06/04/2016  . 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 Varicose veins of both lower extremities with complications 64/68/0321  . Dysthymia 07/15/2015  . Open angle with borderline findings, low risk, bilateral 03/25/2015  . Peripheral neuropathy 09/10/2014  . Venous (peripheral) insufficiency 11/10/2012  . Hearing loss 11/10/2012  . AMD (age related macular degeneration) 11/23/2011  . Pseudophakia 11/23/2011  . History of breast cancer in female 03/25/2009  . URTICARIA-HIVES/Angioedema 07/01/2006  . Osteopenia 07/01/2006    Orientation RESPIRATION BLADDER Height & Weight     Self, Place  Normal Incontinent, External catheter(catheter placed 05/29/17) Weight: 151 lb 10.8 oz (68.8 kg) Height:  5\' 5"  (165.1 cm)  BEHAVIORAL SYMPTOMS/MOOD NEUROLOGICAL BOWEL NUTRITION STATUS      Incontinent Diet(puree, nectar thick liquids)  AMBULATORY STATUS COMMUNICATION OF  NEEDS Skin   Extensive Assist Verbally Surgical wounds(closed right groin incision, gauze dressing)                       Personal Care Assistance Level of Assistance  Bathing, Feeding, Dressing Bathing Assistance: Maximum assistance Feeding assistance: Limited assistance Dressing Assistance: Maximum assistance     Functional Limitations Info  Sight, Hearing, Speech Sight Info: Adequate Hearing Info: Adequate Speech Info: Adequate    SPECIAL CARE FACTORS FREQUENCY  PT (By licensed PT), OT (By licensed OT), Speech therapy     PT Frequency: 5x/wk OT Frequency: 5x/wk     Speech Therapy Frequency: 5x/wk      Contractures Contractures Info: Not present    Additional Factors Info  Code Status, Allergies Code Status Info: Full Allergies Info: Azithromycin, Benzonatate           Current Medications (05/31/2017):  This is the current hospital active medication list Current Facility-Administered Medications  Medication Dose Route Frequency Provider Last Rate Last Dose  .  stroke: mapping our early stages of recovery book   Does not apply Once Amie Portland, MD      . 0.9 %  sodium chloride infusion   Intravenous Continuous Rosalin Hawking, MD 40 mL/hr at 05/30/17 1413    . acetaminophen (TYLENOL) tablet 650 mg  650 mg Oral Q4H PRN Amie Portland, MD   650 mg at 05/31/17 2248  . aspirin tablet 325 mg  325 mg Oral Daily Amie Portland, MD   325 mg at 05/31/17 2500  . atorvastatin (LIPITOR) tablet 10 mg  10 mg Oral q1800 Mary Sella, NP  10 mg at 05/29/17 1746  . cefTRIAXone (ROCEPHIN) 1 g in dextrose 5 % 50 mL IVPB  1 g Intravenous Q24H Rolla Flatten, RPH 100 mL/hr at 05/30/17 1802 1 g at 05/30/17 1802  . diltiazem (CARDIZEM) tablet 30 mg  30 mg Oral Q6H Costello, Mary A, NP   30 mg at 05/31/17 0936  . doxepin (SINEQUAN) capsule 30 mg  30 mg Oral QHS Costello, Mary A, NP   30 mg at 05/29/17 2142  . famotidine (PEPCID) tablet 20 mg  20 mg Oral QHS Rumbarger, Rachel L,  RPH   20 mg at 05/29/17 2141  . heparin injection 5,000 Units  5,000 Units Subcutaneous Q8H Amie Portland, MD   5,000 Units at 05/31/17 9629  . metoprolol tartrate (LOPRESSOR) tablet 12.5 mg  12.5 mg Oral BID Rosalin Hawking, MD   12.5 mg at 05/31/17 5284  . midodrine (PROAMATINE) tablet 5 mg  5 mg Oral TID WC Sampson Goon, MD   5 mg at 05/31/17 1324  . predniSONE (DELTASONE) tablet 10 mg  10 mg Oral Q48H Garvin Fila, MD   10 mg at 05/31/17 4010  . predniSONE (DELTASONE) tablet 5 mg  5 mg Oral Q48H Costello, Mary A, NP   5 mg at 05/30/17 2725     Discharge Medications: Please see discharge summary for a list of discharge medications.  Relevant Imaging Results:  Relevant Lab Results:   Additional Information SS#: [id number]  Geralynn Ochs, LCSW

## 2017-06-01 ENCOUNTER — Inpatient Hospital Stay (HOSPITAL_COMMUNITY): Payer: Medicare Other

## 2017-06-01 DIAGNOSIS — Z7901 Long term (current) use of anticoagulants: Secondary | ICD-10-CM

## 2017-06-01 MED ORDER — RISAQUAD PO CAPS
1.0000 | ORAL_CAPSULE | Freq: Every day | ORAL | Status: DC
  Administered 2017-06-01 – 2017-06-03 (×3): 1 via ORAL
  Filled 2017-06-01 (×3): qty 1

## 2017-06-01 MED ORDER — CEPHALEXIN 500 MG PO CAPS
500.0000 mg | ORAL_CAPSULE | Freq: Two times a day (BID) | ORAL | Status: DC
  Administered 2017-06-01 – 2017-06-03 (×5): 500 mg via ORAL
  Filled 2017-06-01 (×5): qty 1

## 2017-06-01 MED ORDER — CEPHALEXIN 500 MG PO CAPS: 500.0000 mg | ORAL_CAPSULE | Freq: Two times a day (BID) | ORAL | 0 refills | Status: AC

## 2017-06-01 MED ORDER — APIXABAN 5 MG PO TABS
5.0000 mg | ORAL_TABLET | Freq: Two times a day (BID) | ORAL | Status: DC
Start: 2017-06-01 — End: 2017-06-03
  Administered 2017-06-01 – 2017-06-03 (×5): 5 mg via ORAL
  Filled 2017-06-01 (×5): qty 1

## 2017-06-01 MED ORDER — NYSTATIN 100000 UNIT/ML MT SUSP
5.0000 mL | Freq: Four times a day (QID) | OROMUCOSAL | Status: DC
  Administered 2017-06-01 – 2017-06-02 (×2): 500000 [IU] via ORAL
  Filled 2017-06-01 (×11): qty 5

## 2017-06-01 MED ORDER — ATORVASTATIN CALCIUM 10 MG PO TABS: 10.0000 mg | ORAL_TABLET | Freq: Every day | ORAL | 1 refills | Status: AC

## 2017-06-01 MED ORDER — RISAQUAD PO CAPS: 1.0000 | ORAL_CAPSULE | Freq: Every day | ORAL | 1 refills | Status: AC

## 2017-06-01 MED ORDER — APIXABAN 5 MG PO TABS: 5.0000 mg | ORAL_TABLET | Freq: Two times a day (BID) | ORAL | 1 refills | Status: AC

## 2017-06-01 NOTE — Progress Notes (Signed)
CSW met with patient's daughters to discuss SNF bed offers. Patient's daughter tearfully discussed that the patient would never want to be placed in a nursing home, and wanted to know what she could do to take her home instead. CSW discussed that it wasn't for long term nursing placement, but simply for rehabilitation after patient's stroke. CSW provided information on daily therapy and trying to rehabilitate the patient's skills. Patient's daughters were appreciative of information and agreeable to discuss facilities. Patient's daughters will go and tour [facility] at 12:15 today.   CSW will continue to follow.  Elizabeth Paisley, LCSW Clinical Social Worker [phone]  

## 2017-06-01 NOTE — Discharge Summary (Addendum)
Stroke Discharge Summary  Patient ID: Chloe Lopez   MRN: [id number]      DOB: 1923-07-09  Date of Admission: 05/25/2017 Date of Discharge: 06/03/2017  Attending Physician:  Rosalin Hawking, MD, Stroke MD Consultant(s):    rehabilitation medicine and Interventional Radiology, Anesthesia  Patient's PCP:  Leeanne Rio, MD  DISCHARGE DIAGNOSIS:  Right MCA moderate infarcts due to right ICA occlusion but failed thrombectomy attempt Chronic Afib  Active Problems:   Hypokalemia   Left hemiplegia (HCC) Dysphagia AFIB, Chronic Hypomagnesemia UTI Hypotension Hyperlipidemia Hx of Angioedema Oral Thrush Depression  Past Medical History:  Diagnosis Date  . Angioedema   . Depression   . Dysphagia, pharyngoesophageal phase 07/07/2012  . Left hip pain 07/22/2011  . Leg swelling 11/02/2012  . Sciatica 08/21/2011   Started recently in left hip. Takes tramadol. Does not want surgery. Happens once a week or so.    . Sciatica 04/19/2013  . Sweating 01/21/2012   Might relate to Doxepin   . Urticaria    Past Surgical History:  Procedure Laterality Date  . ABDOMINAL HYSTERECTOMY    . IR ANGIO INTRA EXTRACRAN SEL COM CAROTID INNOMINATE UNI R MOD SED  05/25/2017  . IR ANGIO VERTEBRAL SEL SUBCLAVIAN INNOMINATE UNI R MOD SED  05/25/2017  . IR PERCUTANEOUS ART THROMBECTOMY/INFUSION INTRACRANIAL INC DIAG ANGIO  05/25/2017  . MASTECTOMY, PARTIAL  2009  . RADIOLOGY WITH ANESTHESIA N/A 05/25/2017   Procedure: IR WITH ANESTHESIA CODE STROKE;  Surgeon: Luanne Bras, MD;  Location: [facility];  Service: Radiology;  Laterality: N/A;    Allergies as of 06/03/2017      Reactions   Azithromycin Swelling   Reportedly had reaction September 2017 - caused worsening angioedema. Unclear if it was this or the benzonatate she started at the same time.   Benzonatate Swelling   Reportedly had reaction September 2017 - caused worsening angioedema. Unclear if it was this or the azithromycin she started at the  same time.      Medication List    STOP taking these medications   traMADol 50 MG tablet Commonly known as:  ULTRAM     TAKE these medications   acidophilus Caps capsule Take 1 capsule by mouth daily.   apixaban 5 MG Tabs tablet Commonly known as:  ELIQUIS Take 1 tablet (5 mg total) by mouth 2 (two) times daily.   atorvastatin 10 MG tablet Commonly known as:  LIPITOR Take 1 tablet (10 mg total) by mouth daily at 6 PM.   CARTIA XT 180 MG 24 hr capsule Generic drug:  diltiazem TAKE ONE CAPSULE BY MOUTH DAILY   cephALEXin 500 MG capsule Commonly known as:  KEFLEX Take 1 capsule (500 mg total) by mouth every 12 (twelve) hours.   doxepin 10 MG capsule Commonly known as:  SINEQUAN Take 3 capsules (30 mg total) by mouth at bedtime. What changed:  how much to take   EPIPEN 2-PAK 0.3 mg/0.3 mL Soaj injection Generic drug:  EPINEPHrine Inject 0.3 mg into the muscle once. Reported on 08/30/2015   lidocaine 5 % Commonly known as:  LIDODERM Place 2 patches onto the skin daily. Remove & Discard patch within 12 hours or as directed by MD   metoprolol tartrate 25 MG tablet Commonly known as:  LOPRESSOR Take 0.5 tablets (12.5 mg total) by mouth 2 (two) times daily.   midodrine 10 MG tablet Commonly known as:  PROAMATINE Take 1 tablet (10 mg total) by mouth 3 (  three) times daily with meals.   nystatin 100000 UNIT/ML suspension Commonly known as:  MYCOSTATIN Take 5 mLs (500,000 Units total) by mouth 4 (four) times daily.   potassium chloride SA 20 MEQ tablet Commonly known as:  K-DUR,KLOR-CON Take 2 tablets (40 mEq total) by mouth daily.   predniSONE 10 MG tablet Commonly known as:  DELTASONE TAKE AS DIRECTED. ALTERNATE 10 MG ONE DAY THEN 5 MG THE NEXT. What changed:    how much to take  how to take this  when to take this  additional instructions   ranitidine 150 MG tablet Commonly known as:  ZANTAC Take one tablet twice a day. What changed:    how much to  take  how to take this  when to take this  reasons to take this  additional instructions      LABORATORY STUDIES CBC    Component Value Date/Time   WBC 9.0 06/03/2017 0349   RBC 4.26 06/03/2017 0349   HGB 13.3 06/03/2017 0349   HGB 14.8 06/19/2015 1021   HGB 14.8 04/04/2008 1057   HCT 41.2 06/03/2017 0349   HCT 43.9 06/19/2015 1021   HCT 43.6 04/04/2008 1057   PLT 342 06/03/2017 0349   PLT 290 04/04/2008 1057   MCV 96.7 06/03/2017 0349   MCV 93 06/19/2015 1021   MCV 94.6 04/04/2008 1057   MCH 31.2 06/03/2017 0349   MCHC 32.3 06/03/2017 0349   RDW 14.1 06/03/2017 0349   RDW 14.2 06/19/2015 1021   RDW 13.6 04/04/2008 1057   LYMPHSABS 3.1 05/25/2017 1118   LYMPHSABS 2.2 06/19/2015 1021   LYMPHSABS 3.1 04/04/2008 1057   MONOABS 0.8 05/25/2017 1118   MONOABS 0.7 04/04/2008 1057   EOSABS 0.1 05/25/2017 1118   EOSABS 0.0 06/19/2015 1021   BASOSABS 0.0 05/25/2017 1118   BASOSABS 0.0 06/19/2015 1021   BASOSABS 0.0 04/04/2008 1057   CMP    Component Value Date/Time   NA 143 06/03/2017 0349   NA 142 06/19/2015 1021   K 4.1 06/03/2017 0349   CL 109 06/03/2017 0349   CO2 24 06/03/2017 0349   GLUCOSE 96 06/03/2017 0349   BUN 14 06/03/2017 0349   BUN 12 06/19/2015 1021   CREATININE 0.61 06/03/2017 0349   CREATININE 0.93 03/13/2013 1011   CALCIUM 9.6 06/03/2017 0349   PROT 5.5 (L) 06/02/2017 0347   PROT 6.6 06/19/2015 1021   ALBUMIN 2.5 (L) 06/02/2017 0347   ALBUMIN 4.1 06/19/2015 1021   AST 29 06/02/2017 0347   ALT 25 06/02/2017 0347   ALKPHOS 67 06/02/2017 0347   BILITOT 0.8 06/02/2017 0347   BILITOT <0.2 06/19/2015 1021   GFRNONAA >60 06/03/2017 0349   GFRAA >60 06/03/2017 0349   COAGS Lab Results  Component Value Date   INR 1.03 05/25/2017   INR 1.06 01/03/2011   Lipid Panel    Component Value Date/Time   CHOL 141 05/26/2017 0414   TRIG 62 05/26/2017 0414   HDL 55 05/26/2017 0414   CHOLHDL 2.6 05/26/2017 0414   VLDL 12 05/26/2017 0414   LDLCALC  74 05/26/2017 0414   HgbA1C  Lab Results  Component Value Date   HGBA1C 5.5 05/26/2017   Urinalysis    Component Value Date/Time   COLORURINE AMBER (A) 05/30/2017 1542   APPEARANCEUR HAZY (A) 05/30/2017 1542   LABSPEC 1.020 05/30/2017 1542   PHURINE 6.0 05/30/2017 1542   GLUCOSEU NEGATIVE 05/30/2017 1542   HGBUR LARGE (A) 05/30/2017 1542   BILIRUBINUR NEGATIVE 05/30/2017  [place] 08/21/2013 0947   KETONESUR NEGATIVE 05/30/2017 1542   PROTEINUR 30 (A) 05/30/2017 1542   UROBILINOGEN 0.2 08/21/2013 0947   UROBILINOGEN 0.2 01/03/2011 0525   NITRITE POSITIVE (A) 05/30/2017 1542   LEUKOCYTESUR LARGE (A) 05/30/2017 1542   Urine Drug Screen No results found for: LABOPIA, COCAINSCRNUR, LABBENZ, AMPHETMU, THCU, LABBARB  Alcohol Level    Component Value Date/Time   ETH (H) 06/14/2009 0304    100        LOWEST DETECTABLE LIMIT FOR SERUM ALCOHOL IS 5 mg/dL FOR MEDICAL PURPOSES ONLY   SIGNIFICANT DIAGNOSTIC STUDIES Ct Head Wo Contrast 05/30/2017 IMPRESSION: Acute infarct of right basal ganglia and anterior internal capsule. Additional smaller acute infarcts on 05/25/2017 brain MRI are not visible by CT. No hemorrhagic conversion or detected progression.   DG Chest Port 1 View 05/30/2017 IMPRESSION: Very small bilateral pleural effusions and mild left basilar atelectasis.  Ct Angio Head/Neck W Or Wo Contrast 05/25/2017 IMPRESSION:  1. The right internal carotid artery is occluded at the carotid bifurcation.  2. The fetal type right posterior cerebral artery fills via a small right P1 segment to the right ICA terminus.  3. Right M1 occlusion with reconstitution of right MCA branch vessels beyond the bifurcation suggesting collateral flow.  4. Right A1 segment flow is likely retrograde.  5. No significant left-sided stenosis.  6. Aortic Atherosclerosis (ICD10-I70.0). No significant stenosis or aneurysm at the aortic arch. 7. Multilevel spondylosis of the cervical  spine.   Mr Brain Wo Contrast 05/25/2017  IMPRESSION:  1. Acute ischemic right MCA territory infarct involving the right basal ganglia, with additional scattered subcentimeter cortical subcortical infarcts within the overlying right cerebral hemisphere. No associated hemorrhage or mass effect.  2. Single punctate nonhemorrhagic left parietal cortical infarct as above.  3. Abnormal flow voids within the right ICA and M1 segment, consistent with previously identified occlusion.  4. Age-related atrophy with mild to moderate chronic small vessel ischemic disease.   Ct Cerebral Perfusion W Contrast Result Date: 05/25/2017 IMPRESSION:  1. Large area of ischemic penumbra involving the right MCA territory with estimated volume of 130 mL and small core infarct involving the basal ganglia. The estimated core volume is 8 mm. 2. The right MCA ischemia corresponds to right ICA and M1 occlusions.   Ct Head Code Stroke Wo Contrast 05/25/2017   IMPRESSION:  1. Hyperdense right MCA compatible with thrombosis. No acute infarct or hemorrhage  2. ASPECTS is 10   Echocardiogram                           05/26/2017 Study Conclusions - Left ventricle: The cavity size was normal. There was mild focal basal hypertrophy of the septum. Systolic function was normal. The estimated ejection fraction was in the range of 55% to 60%. Wall motion was normal; there were no regional wall motion abnormalities. Impressions: - Normal LV systolic function; trace MR; mild TR.  CHEST  2 VIEW 06/02/2017 20:14 IMPRESSION: 1. The previously described right chest densities were artifacts. 2. Cardiomegaly and changes of COPD with small bilateral pleural Effusions  PORTABLE ABDOMEN - 1 VIEW 06/01/2017 19:15 IMPRESSION: No evidence of bowel obstruction or free air.  ABDOMEN ULTRASOUND COMPLETE 06/02/2017 13:14 IMPRESSION: 1. Cholelithiasis with positive sonographic Murphy sign. This combination of findings  is suggestive of acute cholecystitis. If clinical picture is equivocal, nuclear scintigraphy may prove helpful to further evaluate no associated biliary dilatation 2. Abdominal  aortic atherosclerosis. 3. Right renal cyst. 4. Right pleural effusion.    HISTORY OF PRESENT ILLNESS   &   HOSPITAL COURSE IMPRESSION: [name] is a 84 y.o. female with PMH of atrial fibrillation not on anticoagulation, peripheral neuropathy functionally dependent at baseline lives at home in her usual state of health last known normal at 7 PM last night was found to have left-sided weakness and right gaze preference along with dysarthria and left facial weakness. NIH stroke scale 19 for a right MCA syndrome.  Noncontrast CT of the head shows no bleed. CT aspects 728.  CT angiogram head and neck showed right ICA occlusion and right MCA occlusion in the M1 segment.  CT perfusion profile favorable for emergent intervention because of a small 8 cc core and large ischemic penumbra 130 cc.  Her stroke is likely due to afib not on [facility].   Acute ischemic right MCA territory infarct -Right basal ganglia, with additional scattered subcentimeter cortical subcortical infarcts within the overlying right cerebral hemisphere Occlusion and stenosis of R carotid artery  Suspected Etiology: cardioembolic source, AFIB not on AC therapy Resultant Symptoms: Left sided defcits Stroke Risk Factors: atrial fibrillation and hypertension Other Stroke Risk Factors: Advanced age,   PROCEDURES:  05/25/2017 by Dr Estanislado Pandy s/p revascularization attempt, unable to achieve reperfusion.  Outstanding Stroke Work-up Studies:    Work up completed at this time  PLAN  05/31/2017: Continue Eliquis/ Statin Ongoing aggressive stroke risk factor management Patient's family counseled to be compliant withherantithrombotic medications Patient's family counseled on Lifestyle modifications including, Diet, Exercise, and Stress Follow up with [facility] in 6 weeks [facility] outpatient Palliative care consultation at SNF  DYSPHAGIA: SLP swallow evaluation - now on dysphagia 1 diet with nectar thick liquids Aspiration Precautions in progress  MEDICAL ISSUES: RUQ Abdominal Pain Abdominal Xray 1/29 Negative for acute findings Abdominal U/S 1/30 suggestive of acute cholecystitis. Remains afebrile, no leukocytosis  General Surgery consulted - Appreciate Assistance No signs of acute cholecystitis  patient is not a surgical candidate for chronic cholecystitis,  If any concern for acute cholecystitis in the future would recommend HIDA and likely percutaneous drainage.    AFIB, CHRONIC: Cardizem and Metoprolol for rate control Eliquis started 06/01/2017  Fluid/Electrolyte Disorders-   Hypokalemia - Resolved Hypomagnesemia - resolved   Possible Aspiration PNA CXR 1/27 - Very small bilateral pleural effusions and mild left basilar atelectasis. Remains afebrile, no leukocytosis, No SOB  Continue incentive spirometry Repeat Chest Xray  1/30 - no acute findings  UTI: E.COLI IV Rocephin changed to PO Keflex per Pharmacy recs Foley in place for Urinary retention issues Family requested repeat urine culture to verify antibiotic susceptibility and effectiveness  Oral Thrush Nystatin suspension started Continue to monitor closely  Angioedema Continue home dose of Prednisione  HYPERTENSION/ HYPOTENSION Stable with Midodrine Long term BP goal normotensive. May slowly restart home B/P medications after 48 hours Home Meds: Cardizem, Lopressor  HYPERLIPIDEMIA: Labs(Brief)          Component Value Date/Time   CHOL 141 05/26/2017 0414   TRIG 62 05/26/2017 0414   HDL 55 05/26/2017 0414   CHOLHDL 2.6 05/26/2017 0414   VLDL 12 05/26/2017 0414   LDLCALC 74 05/26/2017 0414    Home Meds:  NONE LDL  goal < 70 Started on Lipitor to 10 mg daily Continue statin at discharge  R/O  DIABETES: RecentLabs       Lab Results  Component Value Date  HGBA1C 5.5 05/26/2017    HgbA1c goal < 7.0 Continue CBG monitoring and SSI to maintain glucose 140-180 mg/dl  DISCHARGE EXAM Blood pressure (!) 123/91, pulse (!) 119, temperature 98.7 F (37.1 C), temperature source Oral, resp. rate 20, height 5\' 5"  (1.651 m), weight 151 lb 10.8 oz (68.8 kg), SpO2 94 %. General - thin built, well developed, lethargic Awake, oriented to family and hospital, lying in bed in NAD HEENT-  Normocephalic   Cardiovascular - Irregular rate and rhythm  Respiratory - Lungs clear bilaterally. No wheezing. Abdomen - soft and non-tender, BS normal Extremities- no edema or cyanosis Neurological exam- Awake, answering simple questions and nods appropriately, follows simple comands Cranial nerves: Pupils equal round reactive to light, right gaze preference able to cross midline to look at the left, left homonymous hemianopsia, left lower facial weakness, decreased sensation in the left side of the face. Motor exam: 0/5 left upper extremity, 1/5 left lower extremity, 4/5 right upper and right lower extremities due to lethargy.+ withdrawal to pain on left Sensory exam: Dense loss with sensory neglect on the left side. Coordination: no ataxia on the right Gait exam - not tested  Discharge Diet   Fall precautions DIET - DYS 1 Room service appropriate? Yes; Fluid consistency: Nectar Thick liquids  DISCHARGE PLAN  Disposition:  SNF  Eliquis (apixaban) daily for secondary stroke prevention.  Ongoing risk factor control by Primary Care Physician at time of       discharge  Follow-up Leeanne Rio, MD in 2 weeks.  Follow-up with Dr. Antony Contras, Stroke Clinic in 6 weeks, office to        schedule an appointment.  Obtain outpatient Palliative care consultation at SNF  Code Status: DNR per daughters at bedside today  Greater than 30 minutes were spent preparing discharge.  Kennadie Brenner Sella,  ANP-C Stroke Neurology Team 06/02/2017 2:10 PM  ATTENDING NOTE: I reviewed above note and agree with the assessment and plan. I have made any additions or clarifications directly to the above note. Pt was seen and examined.   84 year old female with history of A. fib not on anticoagulation, angioedema on prednisone admitted for left-sided weakness, right gaze, left facial droop and slurred speech. CT showed right MCA hyperdense. CTA head and neck showed right ICA occlusion. CTP showed large penumbra. Patient taken to IR, however failed to revascularize. MRI showed right BG, CR, caudate head infarcts as well as right scattered MCA punctate infarcts. EF 55-60%. A1c 5.5 and LDL 74. on liptor. Her stroke is likely due to afib not on [facility]. Started eliquis for stroke prevention.  Patient hypokalemiaimproved, today 4.1. Continue supplement.BP stable and continue midodrine 10mg  tid. Heart rate controlled by metoprolol and Cardizem. UTI on rocephin, changed to po keflex, urine culture E Coli, pan-sensitive. Repeat CT head no change. complains of abdominal pain on 06/01/17 and KUB no acute abnormality. US abdomen initially concerning for acute cholecystitis, but surgery consult ruled that out. Pt back on diet and vital and labs stable for SNF discharge.  Rosalin Hawking, MD PhD Stroke Neurology 06/03/2017 11:56 AM

## 2017-06-01 NOTE — Progress Notes (Signed)
Called by case manager that pt has been discharged to [facility] where has a bed for the patient. However, pt family toured the facility this morning and does not like the place and would like her to go to [facility] when there is a bed for her. [facility] is much more convenient for pt and family needs.   Daughters stated that they were contacted yesterday that CIR was not going to accept her because they do not have family at home with her when she is ready to discharge from [facility]. Daughter at that time said they would like to discuss about it among all daughters to see if they can do something about it.   Daughters stated that they were told this morning to pick a SNF facility for their mom. They went to [facility] but not feel good but finally chose [facility] but they do not have bed for her today but tomorrow is OK.   I saw the PT note today, it still recommended CIR. Therefore, I called CIR coordinator Pamala Hurry and she told me that pt is not a candidate for CIR due to lethargy and other medical condition. I feel this is very reasonable and appropriate. I told daughters about this too and they understood well.   I went to see pt today and she was lethargic but able to open eyes and answer some very simple questions. However, she still has profound left hemiplegia and left facial droop with dysarthria. When I asked her whether she had any pain anywhere, she pointed to her belly, more specifically, RUQ. I asked her what the pain feel like, she said she felt it is the pain from outside. I do see there is bruise from heparin shots. Later, when daughter asked her about abdominal pain, she then pointed to lower central abdomin, said the pain was inside. I wonder whether that is from catheter but she said no. She does have UTI and has been on antibiotics for 3 days and she has foley catheter. Pt had foley catheter re-inserted for urinary retention.  At this stage, due to her previous severe  hypokalemia, I feel we need to do a abdominal plain film to make sure there is no ileus or bowel obstruction. Need to repeat CBC and BMP in am for monitoring. She is not ready for SNF discharge today. We will rescind the discharge order today but if all above work up negative, I feel she can be discharged tomorrow. Pt daughters are in agreement.   Rosalin Hawking, MD PhD Stroke Neurology 06/01/2017 4:47 PM

## 2017-06-01 NOTE — Consult Note (Signed)
           [facility] CM Primary Care Navigator  06/01/2017  Chloe Lopez [birth year] [id number]   Attempt to see patient and daughters at the bedside to identify possible discharge needs but rehab therapists are getting ready to work with patient at this moment.  Will try to meet with patient and daughters at another time when available in the room.    Addendum:  Was able to meet and talk with patient's daughters prior to visiting a skilled nursing facility after patient's therapy session to identify possible discharge needs.  Patient had left-sided weakness, left facial weakness and droop along with slurred speech that had led to this admission.  Daughter confirmed that Dr. Chrisandra Lopez with [facility] as patient's primary care provider.   [facility] [address] [place].  Per daughters' report, patient has been managing her own medications at home prior to admission but will be needing assistance after discharge.  Daughter- Chloe Lopez has been providing transportation to her doctors appointments.  Patient lives at home alone prior to this hospitalization and functioning independently. Daughter- Chloe Lopez has been assisting with her needs at home as stated.   Discharge planis to be determined yet pending PT/ OT recommendation and physician order.  Per Inpatient social worker note, patient is now needing maximum assist and would not be safe to return to living alone at this time. Daughters seem to have good understanding of patient's current condition but seems overwhelmed to make the right decision.  Patient's daughters voiced understandingof needto call primary care provider's office if patientreturns backhome, for a post discharge follow-up appointment within1-2weeksor sooner if needs arise.Patient letter (with PCP's contact number) was provided as  areminder.  Discuss[facility]ters [facility] services available forhealthmanagement at homebut indicatedthat they are unsure yet of plans for patient's discharge for now.  Encourageddaughters to seekreferral to [facility] care managementfrom primary care providerif deemed necessary andappropriate for any services in the near future (once she will return back home).  [facility] care management information provided for future needs thatpatient may have.   Primary care provider's office is listed as providing transition of care (TOC).  Noted order for EMMI stroke calls in place for patient.   For questions, please contact:  Chloe Lopez, BSN, RN- [facility] Primary Care Navigator  Telephone: [phone] [place]

## 2017-06-01 NOTE — Plan of Care (Signed)
Pt is a/o x2-3. D/o to time. L sided weakness. Absent mvmt of LUE. Minimal mvmt of LLE. MS does fluctuate. A. Fib < 120. HR > 120 when up in chair. Assisted back to bed and HR dec likely a comfort issue. VSS. Ra. Bilateral breath sounds clear. Bowel sounds present. No Bm. Foley for retention. Turned q2. Falls precautions maintained. Worked with PT today. Eliquis PO for VTE. Daughters exploring SNF placements now. Plan to d/c to a facility today.

## 2017-06-01 NOTE — Progress Notes (Signed)
Physical Therapy Treatment Patient Details Name: Chloe Lopez MRN: [id number] DOB: August 03, 1923 Today's Date: 06/01/2017    History of Present Illness 84 yo female with chronic atrial fibrillation and neuropathy who was found by daughter with left hemiplegia. MRI showing Acute ischemic right MCA territory infarct involving the right. Unsuccessful revascularization. Pt intubated 1/22-1/23    PT Comments    Pt with increased sitting EOB tolerance and beginning to move L LE. L UE remains flaccid. Pt con't to have L inattention as well but is able to turn head to the left and track with eyes with max v/c's. Pt remains to requires maxAx2 for mobility and ADls. Acute PT to con't to follow.   Follow Up Recommendations  CIR;Supervision/Assistance - 24 hour     Equipment Recommendations  Wheelchair (measurements PT);3in1 (PT);Wheelchair cushion (measurements PT);Hospital bed    Recommendations for Other Services       Precautions / Restrictions Precautions Precautions: Fall Precaution Comments: left hemiplegia, left inattention, very sensitive skin bil LE (does better if help her move from her feet), R pusher Restrictions Weight Bearing Restrictions: No    Mobility  Bed Mobility Overal bed mobility: Needs Assistance Bed Mobility: Sidelying to Sit   Sidelying to sit: Max assist       General bed mobility comments: pt able to bring R LE off EOB, maxA for L LE management and for trunk elevation  Transfers Overall transfer level: Needs assistance Equipment used: (2 person lift with bed pad) Transfers: Sit to/from [employer] Transfers Sit to Stand: Max assist;+2 physical assistance Stand pivot transfers: Max assist;+2 physical assistance       General transfer comment: pt with no active movement of L LE in stepping pattern, unable to use L UE functionally  Ambulation/Gait             General Gait Details: unable   Stairs            Wheelchair Mobility     Modified Rankin (Stroke Patients Only) Modified Rankin (Stroke Patients Only) Pre-Morbid Rankin Score: No symptoms Modified Rankin: Severe disability     Balance Overall balance assessment: Needs assistance Sitting-balance support: Single extremity supported;Feet supported Sitting balance-Leahy Scale: Poor Sitting balance - Comments: pusehd with R UE, unable to maintain R UE in lap Postural control: Left lateral lean Standing balance support: Bilateral upper extremity supported Standing balance-Leahy Scale: Poor Standing balance comment: 2 person assist in standing                            Cognition Arousal/Alertness: Awake/alert Behavior During Therapy: Flat affect Overall Cognitive Status: Impaired/Different from baseline Area of Impairment: Attention;Memory;Following commands;Safety/judgement;Awareness;Problem solving                   Current Attention Level: Sustained Memory: Decreased recall of precautions;Decreased short-term memory Following Commands: Follows one step commands with increased time Safety/Judgement: Decreased awareness of safety;Decreased awareness of deficits(L inattention) Awareness: Intellectual Problem Solving: Slow processing;Decreased initiation;Difficulty sequencing;Requires verbal cues;Requires tactile cues General Comments: pt reports cats t/o the room      Exercises General Exercises - Lower Extremity Long Arc Quad: AAROM;Left;10 reps;Seated Heel Slides: AAROM;Left;10 reps;Supine    General Comments General comments (skin integrity, edema, etc.): daughter present, educated them on staying on the L and making her turn to the left. using hand to hold up fingers for pt to state      Pertinent Vitals/Pain Pain Assessment: No/denies pain(but  skin very sensitive to touch, esp LEs)    Home Living                      Prior Function            PT Goals (current goals can now be found in the care plan  section) Progress towards PT goals: Progressing toward goals    Frequency    Min 3X/week      PT Plan Current plan remains appropriate    Co-evaluation              AM-PAC PT "6 Clicks" Daily Activity  Outcome Measure  Difficulty turning over in bed (including adjusting bedclothes, sheets and blankets)?: Unable Difficulty moving from lying on back to sitting on the side of the bed? : Unable Difficulty sitting down on and standing up from a chair with arms (e.g., wheelchair, bedside commode, etc,.)?: Unable Help needed moving to and from a bed to chair (including a wheelchair)?: Total Help needed walking in hospital room?: Total Help needed climbing 3-5 steps with a railing? : Total 6 Click Score: 6    End of Session Equipment Utilized During Treatment: (used bed pad and not belt due to sensitivity) Activity Tolerance: Patient tolerated treatment well Patient left: in chair;with call bell/phone within reach;with chair alarm set;with family/visitor present Nurse Communication: Mobility status(drop arm of chair and slide pt back to bed with pad) PT Visit Diagnosis: Other abnormalities of gait and mobility (R26.89);Unsteadiness on feet (R26.81);Hemiplegia and hemiparesis Hemiplegia - Right/Left: Left Hemiplegia - dominant/non-dominant: Non-dominant Hemiplegia - caused by: Cerebral infarction     Time: 0086-7619 PT Time Calculation (min) (ACUTE ONLY): 35 min  Charges:  $Therapeutic Activity: 8-22 mins $Neuromuscular Re-education: 8-22 mins                    G Codes:       Kittie Plater, PT, DPT Pager #: [phone] Office #: [phone]    Chloe Lopez 06/01/2017, 12:51 PM

## 2017-06-01 NOTE — Progress Notes (Signed)
CSW following for discharge plan. CSW contacted by patient's daughters that after looking at facilities they would prefer admission to [facility]. CSW contacted admissions at [facility], and facility will not be able to get the patient's medications today due to the end of the day.   Patient has a bed available at [facility] and can transition there tomorrow, if medically stable. CSW will continue to follow.  Laveda Abbe, [place] Clinical Social Worker [phone]

## 2017-06-01 NOTE — Progress Notes (Signed)
  Speech Language Pathology Treatment: Cognitive-Linquistic  Patient Details Name: Chloe Lopez MRN: [id number] DOB: [birth year] Today's Date: 06/01/2017 Time: 2202-5427 SLP Time Calculation (min) (ACUTE ONLY): 12 min  Assessment / Plan / Recommendation Clinical Impression  Pt was seen for skilled ST targeting cognitive goals.  Pt was asleep but awakened easily to voice upon therapist's arrival.  Pt was disoriented to date and place but could recall that she'd had a stroke.  SLP provided pt with a calendar to facilitate improved orientation which pt could use to locate month, day of the week, date, and year with max assist multimodal cues.  Pt also needed max assist multimodal cues for visual scanning to the left due to right gaze preference.  Attempted to engage pt in dysphagia treatment; however, as session progressed pt became increasingly more lethargic.  Pt had 2-3 sips of nectar thick liquids without overt s/s of aspiration but then began to fall asleep with cup in her hand; therefore further trials were stopped for pt safety.  Pt was left in bed with bed alarm set and call bell within reach.    HPI HPI: 84 yo female with chronic atrial fibrillation and neuropathy who was found by daughter with left hemiplegia. MRI showing acute ischemic right MCA territory infarct involving the right basal ganglia, with additional scattered subcentimeter cortical/subcortical infarcts within the overlying right cerebral hemisphere. Single punctate nonhemorrhagic left parietal cortical infarct as above. Unsuccessful revascularization. Pt intubated 1/22-1/23      SLP Plan  Continue with current plan of care       Recommendations                   Oral Care Recommendations: Oral care prior to ice chip/H20;Oral care QID Follow up Recommendations: Home health SLP;24 hour supervision/assistance;Inpatient Rehab;Outpatient SLP;Skilled Nursing facility SLP Visit Diagnosis: Attention and concentration  deficit Attention and concentration deficit following: Cerebral infarction Plan: Continue with current plan of care       GO                Lyvia Mondesir, Selinda Orion 06/01/2017, 9:30 AM

## 2017-06-01 NOTE — Progress Notes (Addendum)
ANTICOAGULATION CONSULT NOTE - Initial Consult  Pharmacy Consult for Apixaban Indication: atrial fibrillation  Allergies  Allergen Reactions  . Azithromycin Swelling    Reportedly had reaction September 2017 - caused worsening angioedema. Unclear if it was this or the benzonatate she started at the same time.  . Benzonatate Swelling    Reportedly had reaction September 2017 - caused worsening angioedema. Unclear if it was this or the azithromycin she started at the same time.    Patient Measurements: Height: 5\' 5"  (165.1 cm) Weight: 151 lb 10.8 oz (68.8 kg) IBW/kg (Calculated) : 57  Vital Signs: Temp: 98.8 F (37.1 C) (01/29 0858) Temp Source: Oral (01/29 0858) BP: 120/71 (01/29 0858) Pulse Rate: 103 (01/29 0858)  Labs: Recent Labs    05/30/17 0155 05/31/17 0451  HGB 13.7 13.5  HCT 40.7 41.1  PLT 308 303  CREATININE 0.58 0.53    Estimated Creatinine Clearance: 43.7 mL/min (by C-G formula based on SCr of 0.53 mg/dL).   Medical History: Past Medical History:  Diagnosis Date  . Angioedema   . Depression   . Dysphagia, pharyngoesophageal phase 07/07/2012  . Left hip pain 07/22/2011  . Leg swelling 11/02/2012  . Sciatica 08/21/2011   Started recently in left hip. Takes tramadol. Does not want surgery. Happens once a week or so.    . Sciatica 04/19/2013  . Sweating 01/21/2012   Might relate to Doxepin   . Urticaria     Medications:  Scheduled:  .  stroke: mapping our early stages of recovery book   Does not apply Once  . apixaban  5 mg Oral BID  . aspirin  325 mg Oral Daily  . atorvastatin  10 mg Oral q1800  . diltiazem  30 mg Oral Q6H  . doxepin  30 mg Oral QHS  . famotidine  20 mg Oral QHS  . metoprolol tartrate  12.5 mg Oral BID  . midodrine  10 mg Oral TID WC  . predniSONE  10 mg Oral Q48H  . predniSONE  5 mg Oral Q48H   Infusions:  . sodium chloride 40 mL/hr at 06/01/17 0600  . cefTRIAXone (ROCEPHIN)  IV 1 g (05/31/17 1747)    Assessment: 84 yo F  admitted with left-sided weakness, right gaze, left facial droop and slurred speech.  CT / MRI showed multiple infarcts.  Noted to have a hx afib but not on anticoagulation.  Pharmacy asked to start apixaban for stroke prevention.  Goal of Therapy:  Therapeutic Anticoagulation Monitor platelets by anticoagulation protocol: Yes   Plan:  Apixaban 5mg  PO BID Follow-up for s/sx of bleeding Monitor CBC intermittently.  **Patient also noted to be on ASA 325mg .  Consider reducing to 81mg  or discontinuing with the addition of Apixaban.   [employer], Pharm.D., BCPS Clinical Pharmacist Pager: [phone] Clinical phone for 06/01/2017 from 8:30-4:00 is [phone]. After 4pm, please call [facility] [phone]) for assistance. 06/01/2017 10:00 AM

## 2017-06-02 ENCOUNTER — Other Ambulatory Visit: Payer: Self-pay

## 2017-06-02 ENCOUNTER — Inpatient Hospital Stay (HOSPITAL_COMMUNITY): Payer: Medicare Other

## 2017-06-02 DIAGNOSIS — R109 Unspecified abdominal pain: Secondary | ICD-10-CM

## 2017-06-02 DIAGNOSIS — R0781 Pleurodynia: Secondary | ICD-10-CM

## 2017-06-02 DIAGNOSIS — G8194 Hemiplegia, unspecified affecting left nondominant side: Secondary | ICD-10-CM

## 2017-06-02 DIAGNOSIS — I63311 Cerebral infarction due to thrombosis of right middle cerebral artery: Secondary | ICD-10-CM

## 2017-06-02 LAB — CBC
HCT: 40.1 % (ref 36.0–46.0)
HEMOGLOBIN: 13.1 g/dL (ref 12.0–15.0)
MCH: 30.9 pg (ref 26.0–34.0)
MCHC: 32.7 g/dL (ref 30.0–36.0)
MCV: 94.6 fL (ref 78.0–100.0)
Platelets: 336 10*3/uL (ref 150–400)
RBC: 4.24 MIL/uL (ref 3.87–5.11)
RDW: 14 % (ref 11.5–15.5)
WBC: 10.5 10*3/uL (ref 4.0–10.5)

## 2017-06-02 LAB — HEPATIC FUNCTION PANEL
ALK PHOS: 67 U/L (ref 38–126)
ALT: 25 U/L (ref 14–54)
AST: 29 U/L (ref 15–41)
Albumin: 2.5 g/dL — ABNORMAL LOW (ref 3.5–5.0)
BILIRUBIN INDIRECT: 0.6 mg/dL (ref 0.3–0.9)
BILIRUBIN TOTAL: 0.8 mg/dL (ref 0.3–1.2)
Bilirubin, Direct: 0.2 mg/dL (ref 0.1–0.5)
TOTAL PROTEIN: 5.5 g/dL — AB (ref 6.5–8.1)

## 2017-06-02 LAB — BASIC METABOLIC PANEL
Anion gap: 10 (ref 5–15)
BUN: 13 mg/dL (ref 6–20)
CALCIUM: 9.3 mg/dL (ref 8.9–10.3)
CHLORIDE: 106 mmol/L (ref 101–111)
CO2: 25 mmol/L (ref 22–32)
CREATININE: 0.63 mg/dL (ref 0.44–1.00)
GFR calc non Af Amer: >60 mL/min (ref >60)
Glucose, Bld: 95 mg/dL (ref 65–99)
Potassium: 3.3 mmol/L — ABNORMAL LOW (ref 3.5–5.1)
SODIUM: 141 mmol/L (ref 135–145)

## 2017-06-02 LAB — URINE CULTURE

## 2017-06-02 MED ORDER — ACETAMINOPHEN 650 MG RE SUPP
650.0000 mg | RECTAL | Status: DC | PRN
  Administered 2017-06-02 (×2): 650 mg via RECTAL
  Filled 2017-06-02 (×2): qty 1

## 2017-06-02 MED ORDER — ACETAMINOPHEN 325 MG PO TABS
650.0000 mg | ORAL_TABLET | ORAL | Status: DC | PRN
  Administered 2017-06-03: 650 mg via ORAL
  Filled 2017-06-02: qty 2

## 2017-06-02 MED ORDER — POTASSIUM CHLORIDE CRYS ER 20 MEQ PO TBCR
40.0000 meq | EXTENDED_RELEASE_TABLET | Freq: Two times a day (BID) | ORAL | Status: DC
  Administered 2017-06-02 – 2017-06-03 (×3): 40 meq via ORAL
  Filled 2017-06-02 (×3): qty 2

## 2017-06-02 MED ORDER — SODIUM CHLORIDE 0.9 % IV SOLN
INTRAVENOUS | Status: DC
  Administered 2017-06-02: 19:00:00 via INTRAVENOUS

## 2017-06-02 MED ORDER — LIDOCAINE 5 % EX PTCH
2.0000 | MEDICATED_PATCH | CUTANEOUS | Status: DC
  Administered 2017-06-02: 2 via TRANSDERMAL
  Filled 2017-06-02 (×3): qty 2

## 2017-06-02 MED ORDER — POTASSIUM CHLORIDE 10 MEQ/100ML IV SOLN
10.0000 meq | INTRAVENOUS | Status: DC
  Administered 2017-06-02: 10 meq via INTRAVENOUS
  Filled 2017-06-02 (×3): qty 100

## 2017-06-02 NOTE — Progress Notes (Signed)
Physical Therapy Treatment Patient Details Name: Chloe Lopez MRN: [id number] DOB: 08/18/1923 Today's Date: 06/02/2017    History of Present Illness 84 yo female with chronic atrial fibrillation and neuropathy who was found by daughter with left hemiplegia. MRI showing Acute ischemic right MCA territory infarct involving the right. Unsuccessful revascularization. Pt intubated 1/22-1/23    PT Comments    Pt with improved cognition and attention to L side today. Pt able to carry relevant conversation but con't to demo L sided hemiplegia requiring maxA for all mobility. Pt also extremely sensitive to touch in bilat LEs as well. Pt unable to sit EOB without maxA. Pt to benefit from SNF upon d/c.   Follow Up Recommendations  Supervision/Assistance - 24 hour;SNF     Equipment Recommendations  Wheelchair (measurements PT);3in1 (PT);Wheelchair cushion (measurements PT);Hospital bed    Recommendations for Other Services       Precautions / Restrictions Precautions Precautions: Fall Precaution Comments: left hemiplegia, left inattention, very sensitive skin bil LE (does better if help her move from her feet), R pusher Restrictions Weight Bearing Restrictions: No    Mobility  Bed Mobility Overal bed mobility: Needs Assistance Bed Mobility: Supine to Sit;Sit to Supine     Supine to sit: Max assist;+2 for physical assistance Sit to supine: Max assist;+2 for physical assistance   General bed mobility comments: pt did attempted to move LEs to EOB, maxA for trunk elevation and to scoot to EOB, maxA to bring LEs back into the bed and to scoot up and reposition in the bed  Transfers Overall transfer level: Needs assistance Equipment used: (2 person lift with gait belt and bed pad) Transfers: Sit to/from Stand Sit to Stand: Max assist;+2 physical assistance         General transfer comment: unable to complete std pvt this date due to going for test  Ambulation/Gait              General Gait Details: unable   Stairs            Wheelchair Mobility    Modified Rankin (Stroke Patients Only) Modified Rankin (Stroke Patients Only) Pre-Morbid Rankin Score: No symptoms Modified Rankin: Severe disability     Balance Overall balance assessment: Needs assistance Sitting-balance support: Single extremity supported;Feet supported Sitting balance-Leahy Scale: Poor Sitting balance - Comments: pt with strong lean to the L but not pushing as much with R LE. pt able to hold midline line briefly wiht minA but then leans/falls to the L with inability to self correct. pt tolerated sitting EOB x 15 min Postural control: Left lateral lean Standing balance support: Bilateral upper extremity supported Standing balance-Leahy Scale: Poor Standing balance comment: 2 person assist in standing                            Cognition Arousal/Alertness: Awake/alert Behavior During Therapy: WFL for tasks assessed/performed Overall Cognitive Status: Impaired/Different from baseline Area of Impairment: Problem solving                             Problem Solving: Slow processing;Decreased initiation;Difficulty sequencing;Requires verbal cues;Requires tactile cues General Comments: pt with increased attn to L side, quick to respond this date compared to yesterday. Is no longer hallucinating.      Exercises      General Comments        Pertinent Vitals/Pain Pain Assessment: Faces Faces Pain  Scale: Hurts whole lot Pain Location: LEs with mobility Pain Descriptors / Indicators: Discomfort Pain Intervention(s): Monitored during session    Home Living                      Prior Function            PT Goals (current goals can now be found in the care plan section) Progress towards PT goals: Progressing toward goals    Frequency    Min 3X/week      PT Plan Current plan remains appropriate    Co-evaluation               AM-PAC PT "6 Clicks" Daily Activity  Outcome Measure  Difficulty turning over in bed (including adjusting bedclothes, sheets and blankets)?: Unable Difficulty moving from lying on back to sitting on the side of the bed? : Unable Difficulty sitting down on and standing up from a chair with arms (e.g., wheelchair, bedside commode, etc,.)?: Unable Help needed moving to and from a bed to chair (including a wheelchair)?: Total Help needed walking in hospital room?: Total Help needed climbing 3-5 steps with a railing? : Total 6 Click Score: 6    End of Session Equipment Utilized During Treatment: Gait belt Activity Tolerance: Patient limited by pain Patient left: in bed;with call bell/phone within reach;with nursing/sitter in room Nurse Communication: Mobility status PT Visit Diagnosis: Other abnormalities of gait and mobility (R26.89);Unsteadiness on feet (R26.81);Hemiplegia and hemiparesis Hemiplegia - Right/Left: Left Hemiplegia - dominant/non-dominant: Non-dominant Hemiplegia - caused by: Cerebral infarction     Time: 1324-4010 PT Time Calculation (min) (ACUTE ONLY): 27 min  Charges:  $Therapeutic Activity: 8-22 mins $Neuromuscular Re-education: 8-22 mins                    G Codes:       Kittie Plater, PT, DPT Pager #: [phone] Office #: [phone]    Zarria Towell M Cesario Weidinger 06/02/2017, 1:39 PM

## 2017-06-02 NOTE — Discharge Instructions (Addendum)

## 2017-06-02 NOTE — Consult Note (Signed)
[facility] Consult/Admission Note  Chloe Lopez [birth year]  [id number].    Requesting MD: Candise Che, NP Chief Complaint/Reason for Consult: RUQ abdominal pain  HPI:   Pt is a 84 yo female with PMH of atrial fibrillation not on anticoagulation, peripheral neuropathy functionally dependent at baseline lives at home who presented to ED with symptoms of a stroke on 01/22. Pt has left sided weakness, dysarthria and left facial weakness. She had a R MCA stroke. Pt was due to be discharged today to SNF but was complaining of RUQ abdominal pain today. US showed cholelithiasis with + Murphy's sign, and wall thickness upper normal at 2-4m. Pt states she feels like the pain is more in her ribs. Daughter stated that pt was moved up in bed and believes her ribs may have been affected. Pt has nausea with pain but no vomiting. Pain is located on the anterior lower R sided ribs, constant, worse with movement and touch, nonradiating. WBC, LFT's, and Tbili WNL. Chest xray pending.   ROS:  Review of Systems  Constitutional: Negative for chills and fever.  Respiratory: Negative for cough and shortness of breath.   Cardiovascular: Positive for chest pain (R sided rib pain).  Gastrointestinal: Positive for abdominal pain and nausea. Negative for vomiting.  Musculoskeletal: Negative.   Neurological: Positive for focal weakness (left side 2/2 CVA).  All other systems reviewed and are negative.    No family history on file.  Past Medical History:  Diagnosis Date  . Angioedema   . Depression   . Dysphagia, pharyngoesophageal phase 07/07/2012  . Left hip pain 07/22/2011  . Leg swelling 11/02/2012  . Sciatica 08/21/2011   Started recently in left hip. Takes tramadol. Does not want surgery. Happens once a week or so.    . Sciatica 04/19/2013  . Sweating 01/21/2012   Might relate to Doxepin   . Urticaria     Past Surgical History:  Procedure Laterality Date  . ABDOMINAL HYSTERECTOMY    .  IR ANGIO INTRA EXTRACRAN SEL COM CAROTID INNOMINATE UNI R MOD SED  05/25/2017  . IR ANGIO VERTEBRAL SEL SUBCLAVIAN INNOMINATE UNI R MOD SED  05/25/2017  . IR PERCUTANEOUS ART THROMBECTOMY/INFUSION INTRACRANIAL INC DIAG ANGIO  05/25/2017  . MASTECTOMY, PARTIAL  2009  . RADIOLOGY WITH ANESTHESIA N/A 05/25/2017   Procedure: IR WITH ANESTHESIA CODE STROKE;  Surgeon: DLuanne Bras MD;  [facility]  Service: Radiology;  Laterality: N/A;    Social History:  reports that she quit smoking about 21 years ago. she has never used smokeless tobacco. She reports that she drinks about 1.2 oz of alcohol per week. She reports that she does not use drugs.  Allergies:  Allergies  Allergen Reactions  . Azithromycin Swelling    Reportedly had reaction September 2017 - caused worsening angioedema. Unclear if it was this or the benzonatate she started at the same time.  . Benzonatate Swelling    Reportedly had reaction September 2017 - caused worsening angioedema. Unclear if it was this or the azithromycin she started at the same time.    Medications Prior to Admission  Medication Sig Dispense Refill  . CARTIA XT 180 MG 24 hr capsule TAKE ONE CAPSULE BY MOUTH DAILY 30 capsule 4  . doxepin (SINEQUAN) 10 MG capsule Take 3 capsules (30 mg total) by mouth at bedtime. (Patient taking differently: Take 20-30 mg by mouth at bedtime. ) 270 capsule 1  . EPINEPHrine (EPIPEN 2-PAK) 0.3 mg/0.3 mL IJ SOAJ injection  Inject 0.3 mg into the muscle once. Reported on 08/30/2015    . predniSONE (DELTASONE) 10 MG tablet TAKE AS DIRECTED. ALTERNATE 10 MG ONE DAY THEN 5 MG THE NEXT. (Patient taking differently: Take 5-10 mg by mouth See admin instructions. Alternate every over day with 53m and 122m) 45 tablet 1  . ranitidine (ZANTAC) 150 MG tablet Take one tablet twice a day. (Patient taking differently: Take 150 mg by mouth daily as needed for heartburn. ) 180 tablet 1  . traMADol (ULTRAM) 50 MG tablet Take 1 tablet (50 mg  total) daily as needed by mouth (pain in legs). 30 tablet 3    Blood pressure 107/78, pulse 78, temperature 98.9 F (37.2 C), temperature source Oral, resp. rate 18, height '5\' 5"'  (1.651 m), weight 151 lb 10.8 oz (68.8 kg), SpO2 95 %.  Physical Exam  Constitutional: Vital signs are normal. No distress.  Elderly white female  HENT:  Head: Normocephalic and atraumatic.  Nose: Nose normal.  Mouth/Throat: Mucous membranes are normal.  + left sided facial droop  Eyes: Conjunctivae are normal. Pupils are equal, round, and reactive to light. Right eye exhibits no discharge. Left eye exhibits no discharge. No scleral icterus.  Neck: Normal range of motion. Neck supple.  Cardiovascular: Normal rate, normal heart sounds and intact distal pulses. An irregularly irregular rhythm present. Exam reveals no gallop and no friction rub.  No murmur heard. Pulses:      Radial pulses are 2+ on the right side, and 2+ on the left side.       Dorsalis pedis pulses are 2+ on the right side, and 2+ on the left side.  Pulmonary/Chest: Effort normal and breath sounds normal. No respiratory distress. She has no wheezes. She has no rhonchi. She has no rales.  Abdominal: Soft. Bowel sounds are normal. She exhibits no distension and no mass. There is no hepatosplenomegaly. There is tenderness in the right upper quadrant. There is no rebound, no guarding and negative Murphy's sign.  Musculoskeletal: She exhibits no edema or deformity.  Neurological: She is alert. GCS score is 15.  Skin: Skin is warm and dry. No rash noted. She is not diaphoretic.  Psychiatric: Mood and affect normal.  Nursing note and vitals reviewed.   Results for orders placed or performed during the hospital encounter of 05/25/17 (from the past 48 hour(s))  CBC     Status: None   Collection Time: 06/02/17  3:47 AM  Result Value Ref Range   WBC 10.5 4.0 - 10.5 K/uL   RBC 4.24 3.87 - 5.11 MIL/uL   Hemoglobin 13.1 12.0 - 15.0 g/dL   HCT 40.1 36.0  - 46.0 %   MCV 94.6 78.0 - 100.0 fL   MCH 30.9 26.0 - 34.0 pg   MCHC 32.7 30.0 - 36.0 g/dL   RDW 14.0 11.5 - 15.5 %   Platelets 336 150 - 400 K/uL  Basic metabolic panel     Status: Abnormal   Collection Time: 06/02/17  3:47 AM  Result Value Ref Range   Sodium 141 135 - 145 mmol/L   Potassium 3.3 (L) 3.5 - 5.1 mmol/L   Chloride 106 101 - 111 mmol/L   CO2 25 22 - 32 mmol/L   Glucose, Bld 95 65 - 99 mg/dL   BUN 13 6 - 20 mg/dL   Creatinine, Ser 0.63 0.44 - 1.00 mg/dL   Calcium 9.3 8.9 - 10.3 mg/dL   GFR calc non Af Amer >60 >60 mL/min  GFR calc Af Amer >60 >60 mL/min    Comment: (NOTE) The eGFR has been calculated using the CKD EPI equation. This calculation has not been validated in all clinical situations. eGFR's persistently <60 mL/min signify possible Chronic Kidney Disease.    Anion gap 10 5 - 15  Hepatic function panel     Status: Abnormal   Collection Time: 06/02/17  3:47 AM  Result Value Ref Range   Total Protein 5.5 (L) 6.5 - 8.1 g/dL   Albumin 2.5 (L) 3.5 - 5.0 g/dL   AST 29 15 - 41 U/L   ALT 25 14 - 54 U/L   Alkaline Phosphatase 67 38 - 126 U/L   Total Bilirubin 0.8 0.3 - 1.2 mg/dL   Bilirubin, Direct 0.2 0.1 - 0.5 mg/dL   Indirect Bilirubin 0.6 0.3 - 0.9 mg/dL   US Abdomen Complete  Result Date: 06/02/2017 CLINICAL DATA:  Abdominal pain EXAM: ABDOMEN ULTRASOUND COMPLETE COMPARISON:  None. FINDINGS: Gallbladder: Multiple shadowing gallstones measuring up to 14 mm. Gallbladder wall thickness upper normal at 2 to 3 mm. Sonographer reports a positive sonographic Murphy sign. Common bile duct: Diameter: 4 mm Liver: No focal lesion identified. Within normal limits in parenchymal echogenicity. Portal vein is patent on color Doppler imaging with normal direction of blood flow towards the liver. IVC: No abnormality visualized. Pancreas: Visualized portion unremarkable. Spleen: Size and appearance within normal limits. Right Kidney: Length: 11.7 cm.  2.3 cm cyst identified.  Left Kidney: Length: 13 cm. Echogenicity within normal limits. No mass or hydronephrosis visualized. Abdominal aorta: Atherosclerotic calcification noted in the wall of the vessel without aneurysm. Other findings: Right pleural effusion evident. IMPRESSION: 1. Cholelithiasis with positive sonographic Murphy sign. This combination of findings is suggestive of acute cholecystitis. If clinical picture is equivocal, nuclear scintigraphy may prove helpful to further evaluate no associated biliary dilatation 2. Abdominal aortic atherosclerosis. 3. Right renal cyst. 4. Right pleural effusion. Electronically Signed   By: Misty Stanley M.D.   On: 06/02/2017 13:14   Dg Abd Portable 1v  Result Date: 06/01/2017 CLINICAL DATA:  Periumbilical pain EXAM: PORTABLE ABDOMEN - 1 VIEW COMPARISON:  None. FINDINGS: Contrast material within nondistended large bowel loops. No evidence of bowel obstruction. No free air organomegaly. IMPRESSION: No evidence of bowel obstruction or free air. Electronically Signed   By: Rolm Baptise M.D.   On: 06/01/2017 19:15      Assessment/Plan Active Problems:   Acute embolic stroke [facility])   Middle cerebral artery embolism, right   Hypokalemia   Left hemiplegia (HCC)  RUQ abdominal pain - Chest xray pending  - will await results of chest xray  - US showed cholelithiasis with + Murphy's sign, and wall thickness upper normal at 2-54m.  Thank you for the consult. We will continue to monitor clinically  JKalman Drape PLake [facility] Hospital[facility] 06/02/2017, 3:30 PM Pager: [phone]: [phone]-Fri 7:00 am-4:30 pm Sat-Sun 7:00 am-11:30 am

## 2017-06-02 NOTE — Progress Notes (Signed)
Physical medicine and rehabilitation follow-up consult  Asked to reevaluate patient by neurology, family and rehab nurse admission coordinator.  Over the last week patient has been treated for UTI.  She has continued to receive PT, OT, and speech therapy . 1/29 Physical therapy recorded that the patient remains max assist for mobility and ADLs. 1/24 physical therapy recorded patient is at max assist for mobility  1/29 speech therapy recorded max assist for visual scanning to the left, difficulty completing treatment session focusing on swallowing.  Fell asleep with cup and hand  1/28 OT recorded patient could not participate due to lethargy  Examination   Patient is alert she is oriented to hospital to month not to date or day She talked about a doctor in [place] that I need to contact regarding some medical devicedevice  Lungs clear Abdomen positive bowel sounds soft nontender palpation Heart irregularly irregular Motor strength is 0/5 in the left deltoid bicep tricep grip There is trace left hip knee extensor synergy 0/5 strength distally Sensation reduced left upper and left lower limb versus right side Right gaze preference Confrontation testing reveals left field cut  Impression 1.  Right MCA infarct with left hemiplegia, minimal motor strength improvements and no functional improvement over the last week. I discussed my findings with the patient's daughter for the phone as well as with neurology nurse practitioner and rehab nurse admission coordinator Patient is still unable to tolerate an intensive inpatient debilitation program and has not demonstrated ability to make improvements over time in her therapy on acute care thus far.

## 2017-06-02 NOTE — Progress Notes (Signed)
Asked by Stanton Kidney with Neurology to see if family is interested in seeing about an inpatient rehab outside of [facility]. CM called and spoke to Wells Guiles and she stated that she had spoken to her sisters and they have decided to have patient d/c to [facility]. CM offered other inpatient rehabs in the surrounding area but per Wells Guiles they feel [facility] will be the best place for her to go. Mary with Neurology and CSW updated.

## 2017-06-02 NOTE — Progress Notes (Signed)
  Speech Language Pathology Treatment: Dysphagia  Patient Details Name: Chloe Lopez MRN: [id number] DOB: 1923/09/09 Today's Date: 06/02/2017 Time: 0156-1537 SLP Time Calculation (min) (ACUTE ONLY): 14 min  Assessment / Plan / Recommendation Clinical Impression  Pt NPO for procedure given increasing abdominal pain.  Daughters present - focus of session was education. Reviewed swallowing status, ongoing restricted diet (dysphagia 1, nectar thick liquids), and concerns for aspiration.  Discussed necessity of pt having a repeat swallow study when clinically ready, likely 2-3 three weeks s/p D/C.  Pt can either return to [facility] or go to [facility] for an OP MBS.  Pt's dtrs verbalize understanding.     HPI HPI: 84 yo female with chronic atrial fibrillation and neuropathy who was found by daughter with left hemiplegia. MRI showing acute ischemic right MCA territory infarct involving the right basal ganglia, with additional scattered subcentimeter cortical/subcortical infarcts within the overlying right cerebral hemisphere. Single punctate nonhemorrhagic left parietal cortical infarct as above. Unsuccessful revascularization. Pt intubated 1/22-1/23      SLP Plan  Continue with current plan of care       Recommendations  Diet recommendations: Dysphagia 1 (puree);Nectar-thick liquid Liquids provided via: Cup Medication Administration: Crushed with puree Supervision: Staff to assist with self feeding Compensations: Minimize environmental distractions;Slow rate;Small sips/bites;Lingual sweep for clearance of pocketing Postural Changes and/or Swallow Maneuvers: Seated upright 90 degrees                Oral Care Recommendations: Oral care BID Follow up Recommendations: Skilled Nursing facility Plan: Continue with current plan of care       GO                Juan Quam Laurice 06/02/2017, 3:22 PM

## 2017-06-02 NOTE — Progress Notes (Signed)
I received a call, voicemail, from daughter, Elzie Rings, in [place] requesting admission of her Mom to inpt rehab for family is arranging caregiver support at home. I also received a call form Stanton Kidney, NP, for Stroke service requesting another Rehab MD assessment per pt family request and that I as Admissions Coordinator not do that assessment. I spoke with Dr. Letta Pate and he did bedside assessment today and contacted daughter, Jacqlyn Larsen, by phone with his decision not to admit pt to inpt rehab. I have notified RN CM, Wonda Cheng NP of that decision. I have left a message for Elzie Rings, daughter to call me back to discuss her concerns. [phone]

## 2017-06-02 NOTE — Progress Notes (Signed)
NEUROHOSPITALISTS STROKE TEAM - DAILY PROGRESS NOTE   SUBJECTIVE (INTERVAL HISTORY) Two daughter at bedside this morning and discussed POC with sister in [place] by phone. Family is requesting that discharge to SNF be placed on hold and patient re-evaluated by CIR for admission. Patient complained of generalized abdominal pain yesterday afternoon. Now complaining of RUQ pain today. No N/V reported. Abd X-ray negative yesterday. Remains afebrile. No other acute events reported overnight.     OBJECTIVE Lab Results: CBC:  Recent Labs  Lab 05/30/17 0155 05/31/17 0451 06/02/17 0347  WBC 6.9 9.9 10.5  HGB 13.7 13.5 13.1  HCT 40.7 41.1 40.1  MCV 92.9 94.7 94.6  PLT 308 303 336   BMP: Recent Labs  Lab 05/29/17 0500 05/30/17 0155 05/31/17 0451 06/02/17 0347  NA 141 142 141 141  K 2.6* 3.5 3.5 3.3*  CL 108 107 107 106  CO2 23 27 23 25   GLUCOSE 94 104* 80 95  BUN 7 8 11 13   CREATININE 0.51 0.58 0.53 0.63  CALCIUM 8.4* 9.1 8.8* 9.3  MG 1.6* 2.4  --   --   PHOS 1.5*  --   --   --    Liver Function Tests:  Recent Labs  Lab 06/02/17 0347  AST 29  ALT 25  ALKPHOS 67  BILITOT 0.8  PROT 5.5*  ALBUMIN 2.5*    PHYSICAL EXAM Temp:  [97.7 F (36.5 C)-99.2 F (37.3 C)] 98.9 F (37.2 C) (01/30 1014) Pulse Rate:  [78-102] 78 (01/30 1014) Resp:  [18] 18 (01/30 1014) BP: (102-120)/(57-82) 107/78 (01/30 1014) SpO2:  [92 %-98 %] 95 % (01/30 1014) General - thin built, well developed, lethargic HEENT-  Normocephalic   Cardiovascular - Irregular rate and rhythm  Respiratory - Lungs clear bilaterally. No wheezing. Abdomen - soft and non-tender, BS normal Extremities- no edema or cyanosis Neurological exam- Awake, but opens eyes to voice and nods appropriately, follows simple comands Cranial nerves: Pupils equal round reactive to light, right gaze preference able to cross midline to look at the left, left homonymous hemianopsia,  left lower facial weakness, decreased sensation in the left side of the face. Motor exam: 0/5 left upper extremity, 1/5 left lower extremity, 4/5 right upper and right lower extremities due to lethargy.+ withdrawal to pain on left Sensory exam: Dense loss with sensory neglect on the left side. Coordination: no ataxia on the right Gait exam - not tested  IMAGING: I have personally reviewed the radiological images below and agree with the radiology interpretations.  Ct Head Wo Contrast 05/30/2017 IMPRESSION: Acute infarct of right basal ganglia and anterior internal capsule. Additional smaller acute infarcts on 05/25/2017 brain MRI are not visible by CT. No hemorrhagic conversion or detected progression.   DG Chest Port 1 View 05/30/2017 IMPRESSION: Very small bilateral pleural effusions and mild left basilar atelectasis.  Ct Angio Head/Neck W Or Wo Contrast 05/25/2017 IMPRESSION:  1. The right internal carotid artery is occluded at the carotid bifurcation.  2. The fetal type right posterior cerebral artery fills via a small right P1 segment to the right ICA terminus.  3. Right M1 occlusion with reconstitution of right MCA branch vessels beyond the bifurcation suggesting collateral flow.  4. Right A1 segment flow is likely retrograde.  5. No significant left-sided stenosis.  6. Aortic Atherosclerosis (ICD10-I70.0). No significant stenosis or aneurysm at the aortic arch. 7. Multilevel spondylosis of the cervical spine.   Mr Brain Wo Contrast 05/25/2017  IMPRESSION:  1. Acute ischemic right  MCA territory infarct involving the right basal ganglia, with additional scattered subcentimeter cortical subcortical infarcts within the overlying right cerebral hemisphere. No associated hemorrhage or mass effect.  2. Single punctate nonhemorrhagic left parietal cortical infarct as above.  3. Abnormal flow voids within the right ICA and M1 segment, consistent with previously identified occlusion.  4.  Age-related atrophy with mild to moderate chronic small vessel ischemic disease.   Ct Cerebral Perfusion W Contrast Result Date: 05/25/2017 IMPRESSION:  1. Large area of ischemic penumbra involving the right MCA territory with estimated volume of 130 mL and small core infarct involving the basal ganglia. The estimated core volume is 8 mm. 2. The right MCA ischemia corresponds to right ICA and M1 occlusions.   Ct Head Code Stroke Wo Contrast 05/25/2017   IMPRESSION:  1. Hyperdense right MCA compatible with thrombosis. No acute infarct or hemorrhage  2. ASPECTS is 10   Echocardiogram                           05/26/2017 Study Conclusions - Left ventricle: The cavity size was normal. There was mild focal   basal hypertrophy of the septum. Systolic function was normal.   The estimated ejection fraction was in the range of 55% to 60%.   Wall motion was normal; there were no regional wall motion   abnormalities. Impressions: - Normal LV systolic function; trace MR; mild TR.  PORTABLE ABDOMEN - 1 VIEW 06/01/2017 19:15 IMPRESSION: No evidence of bowel obstruction or free air.  ABDOMEN ULTRASOUND COMPLETE 06/02/2017 13:14 IMPRESSION: 1. Cholelithiasis with positive sonographic Murphy sign. This combination of findings is suggestive of acute cholecystitis. If clinical picture is equivocal, nuclear scintigraphy may prove helpful to further evaluate no associated biliary dilatation 2. Abdominal aortic atherosclerosis. 3. Right renal cyst. 4. Right pleural effusion.                IMPRESSION: [name] is a 84 y.o. female with PMH of atrial fibrillation not on anticoagulation, peripheral neuropathy functionally dependent at baseline lives at home in her usual state of health last known normal at 7 PM last night was found to have left-sided weakness and right gaze preference along with dysarthria and left facial weakness. NIH stroke scale 19 for a right MCA syndrome.   Noncontrast CT of the head shows no bleed. CT aspects 728.  CT angiogram head and neck showed right ICA occlusion and right MCA occlusion in the M1 segment.  CT perfusion profile favorable for emergent intervention because of a small 8 cc core and large ischemic penumbra 130 cc.  Her stroke is likely due to afib not on [facility].   Acute ischemic right MCA territory infarct -Right basal ganglia, with additional scattered subcentimeter cortical subcortical infarcts within the overlying right cerebral hemisphere Occlusion and stenosis of R carotid artery  Suspected Etiology: cardioembolic source, AFIB not on AC therapy Resultant Symptoms: Left sided defcits Stroke Risk Factors: atrial fibrillation and hypertension Other Stroke Risk Factors: Advanced age,   PROCEDURES:  05/25/2017 by Dr Estanislado Pandy s/p revascularization attempt, unable to achieve reperfusion.  Outstanding Stroke Work-up Studies:    Work up completed at this time  PLAN  06/02/2017: Continue Eliquis/ Statin Frequent neuro checks Telemetry monitoring PT/OT/SLP Case Management /MSW Re-evaluation by CIR for possible admission Ongoing aggressive stroke risk factor management Patient's family counseled to be compliant with her antithrombotic medications Patient's family counseled on Lifestyle modifications including, Diet, Exercise, and Stress  Follow up with [facility] in 6 weeks Cecille Rubin  DYSPHAGIA: SLP swallow evaluation - now on dysphagia 1 diet with nectar thick liquids Aspiration Precautions in progress  MEDICAL ISSUES:  RUQ Abdominal Pain Abdominal Xray 1/29 Negative for acute findings Abdominal U/S 1/30 suggestive of acute cholecystitis. Remains afebrile, no leukocytosis  General Surgery consulted - Appreciate Assistance HIDA Scan - PENDING Patient not likely a surgical candidate  AFIB, CHRONIC: Continue Cardizem and Metoprolol for rate control Continue Eliquis  Fluid/Electrolyte Disorders-     Hypokalemia - Replacement in progress Mg 1.6 -> supplement -> 2.4  Possible Aspiration PNA CXR 1/27 - Very small bilateral pleural effusions and mild left basilar atelectasis. Remains afebrile, no leukocytosis, No SOB  Continue incentive spirometry Repeat Chest Xray pending  1/30  UTI: E.Coli IV Rocephin changed to PO Keflex per Pharmacy recs Foley in place for Urinary retention issues Family requested repeat urine culture to verify antibiotic susceptibility and effectiveness  Oral Thrush Nystatin suspension started Continue to monitor closely  Angioedema Continue home dose of Prednisione  HYPERTENSION/Hypotension Stable with Midodrine Long term BP goal normotensive. Home Meds: Cardizem, Lopressor  HYPERLIPIDEMIA:    Component Value Date/Time   CHOL 141 05/26/2017 0414   TRIG 62 05/26/2017 0414   HDL 55 05/26/2017 0414   CHOLHDL 2.6 05/26/2017 0414   VLDL 12 05/26/2017 0414   LDLCALC 74 05/26/2017 0414  Home Meds:  NONE LDL  goal < 70 Started on Lipitor to 10 mg daily LFT's normal on labs today 06/02/2017 Continue statin at discharge  R/O DIABETES: Lab Results  Component Value Date   HGBA1C 5.5 05/26/2017  HgbA1c goal < 7.0 Continue CBG monitoring and SSI to maintain glucose 140-180 mg/dl  Other Active Problems: Active Problems:   Acute embolic stroke [facility])   Middle cerebral artery embolism, right   Hypokalemia   Left hemiplegia [facility])    Hospital day # 8 VTE prophylaxis: Heparin Diet : Fall precautions DIET - DYS 1 Room service appropriate? Yes; Fluid consistency: Nectar Thick   FAMILY UPDATES: No family at bedside  TEAM UPDATES: Rosalin Hawking, MD STATUS:  FULL   Prior Home Stroke Medications:  No antithrombotic  Discharge Stroke Meds:  Please discharge patient on Eliquis 5 mg BID  Disposition: 01-Home or Self Care Therapy Recs:               SNF Follow Up:  Follow-up Information    Garvin Fila, MD. Schedule an appointment as soon as  possible for a visit in 6 week(s).   Specialties:  Neurology, Radiology Contact information: [address] [place] [address] [phone]        Leeanne Rio, MD. Schedule an appointment as soon as possible for a visit in 1 week(s).   Specialty:  Family Medicine Contact information: [place] [address] [phone]          Leeanne Rio, MD -PCP Follow up in 1-2 weeks     Assessment & plan discussed with with attending physician and they are in agreement.    Renie Ora Stroke Neurology Team 06/02/2017 2:43 PM  ATTENDING NOTE: I reviewed above note and agree with the assessment and plan. I have made any additions or clarifications directly to the above note. Pt was seen and examined.   Pt had KUB last night showed normal bowel pattern. Today she is more awake alert and talkative but still has mild aphasia and  right hemiplegia. However, she still complains of RUQ pain. On exam, it showed focal tenderness on palpation of right lower rib. No abdominal tenderness though. However, a US abdomen showed positive sonographic murphy sign, concerning for acute cholecystitis. General surgery consulted and felt not consistent with acute but chronic cholecystitis with GB stone but no wall thickening. No HIDA needed. Diet resumed. Her portable one view CXR showed right lung field artifact, will do 2 view CXR in radiology department. If above test negative, will d/c to SNF in am.   Rosalin Hawking, MD PhD Stroke Neurology 06/02/2017 5:23 PM    To contact Stroke Continuity provider, please refer to [email or web address] After hours, contact General Neurology

## 2017-06-03 DIAGNOSIS — G8194 Hemiplegia, unspecified affecting left nondominant side: Secondary | ICD-10-CM | POA: Diagnosis not present

## 2017-06-03 DIAGNOSIS — I959 Hypotension, unspecified: Secondary | ICD-10-CM | POA: Diagnosis not present

## 2017-06-03 DIAGNOSIS — E785 Hyperlipidemia, unspecified: Secondary | ICD-10-CM | POA: Diagnosis not present

## 2017-06-03 DIAGNOSIS — R278 Other lack of coordination: Secondary | ICD-10-CM | POA: Diagnosis not present

## 2017-06-03 DIAGNOSIS — I6601 Occlusion and stenosis of right middle cerebral artery: Secondary | ICD-10-CM | POA: Diagnosis not present

## 2017-06-03 DIAGNOSIS — I4891 Unspecified atrial fibrillation: Secondary | ICD-10-CM | POA: Diagnosis not present

## 2017-06-03 DIAGNOSIS — I63511 Cerebral infarction due to unspecified occlusion or stenosis of right middle cerebral artery: Secondary | ICD-10-CM | POA: Diagnosis not present

## 2017-06-03 DIAGNOSIS — R531 Weakness: Secondary | ICD-10-CM | POA: Diagnosis not present

## 2017-06-03 DIAGNOSIS — N390 Urinary tract infection, site not specified: Secondary | ICD-10-CM | POA: Diagnosis not present

## 2017-06-03 DIAGNOSIS — I482 Chronic atrial fibrillation: Secondary | ICD-10-CM | POA: Diagnosis not present

## 2017-06-03 DIAGNOSIS — G464 Cerebellar stroke syndrome: Secondary | ICD-10-CM | POA: Diagnosis not present

## 2017-06-03 DIAGNOSIS — K8020 Calculus of gallbladder without cholecystitis without obstruction: Secondary | ICD-10-CM | POA: Diagnosis not present

## 2017-06-03 DIAGNOSIS — I69354 Hemiplegia and hemiparesis following cerebral infarction affecting left non-dominant side: Secondary | ICD-10-CM | POA: Diagnosis not present

## 2017-06-03 DIAGNOSIS — E876 Hypokalemia: Secondary | ICD-10-CM

## 2017-06-03 DIAGNOSIS — R1011 Right upper quadrant pain: Secondary | ICD-10-CM

## 2017-06-03 DIAGNOSIS — M79601 Pain in right arm: Secondary | ICD-10-CM | POA: Diagnosis not present

## 2017-06-03 DIAGNOSIS — I10 Essential (primary) hypertension: Secondary | ICD-10-CM | POA: Diagnosis not present

## 2017-06-03 DIAGNOSIS — Z7901 Long term (current) use of anticoagulants: Secondary | ICD-10-CM | POA: Diagnosis not present

## 2017-06-03 DIAGNOSIS — G8190 Hemiplegia, unspecified affecting unspecified side: Secondary | ICD-10-CM | POA: Diagnosis not present

## 2017-06-03 DIAGNOSIS — G6289 Other specified polyneuropathies: Secondary | ICD-10-CM | POA: Diagnosis not present

## 2017-06-03 DIAGNOSIS — I639 Cerebral infarction, unspecified: Secondary | ICD-10-CM | POA: Diagnosis not present

## 2017-06-03 DIAGNOSIS — R1312 Dysphagia, oropharyngeal phase: Secondary | ICD-10-CM | POA: Diagnosis not present

## 2017-06-03 DIAGNOSIS — R41841 Cognitive communication deficit: Secondary | ICD-10-CM | POA: Diagnosis not present

## 2017-06-03 DIAGNOSIS — M6281 Muscle weakness (generalized): Secondary | ICD-10-CM | POA: Diagnosis not present

## 2017-06-03 LAB — CBC
HCT: 41.2 % (ref 36.0–46.0)
HEMOGLOBIN: 13.3 g/dL (ref 12.0–15.0)
MCH: 31.2 pg (ref 26.0–34.0)
MCHC: 32.3 g/dL (ref 30.0–36.0)
MCV: 96.7 fL (ref 78.0–100.0)
PLATELETS: 342 10*3/uL (ref 150–400)
RBC: 4.26 MIL/uL (ref 3.87–5.11)
RDW: 14.1 % (ref 11.5–15.5)
WBC: 9 10*3/uL (ref 4.0–10.5)

## 2017-06-03 LAB — URINE CULTURE: CULTURE: NO GROWTH

## 2017-06-03 LAB — MAGNESIUM: MAGNESIUM: 1.7 mg/dL (ref 1.7–2.4)

## 2017-06-03 LAB — BASIC METABOLIC PANEL
Anion gap: 10 (ref 5–15)
BUN: 14 mg/dL (ref 6–20)
CALCIUM: 9.6 mg/dL (ref 8.9–10.3)
CHLORIDE: 109 mmol/L (ref 101–111)
CO2: 24 mmol/L (ref 22–32)
CREATININE: 0.61 mg/dL (ref 0.44–1.00)
Glucose, Bld: 96 mg/dL (ref 65–99)
Potassium: 4.1 mmol/L (ref 3.5–5.1)
SODIUM: 143 mmol/L (ref 135–145)

## 2017-06-03 LAB — PHOSPHORUS: PHOSPHORUS: 2.7 mg/dL (ref 2.5–4.6)

## 2017-06-03 MED ORDER — NYSTATIN 100000 UNIT/ML MT SUSP: 5.0000 mL | Freq: Four times a day (QID) | OROMUCOSAL | 0 refills | Status: AC

## 2017-06-03 MED ORDER — POTASSIUM CHLORIDE CRYS ER 20 MEQ PO TBCR: 40.0000 meq | EXTENDED_RELEASE_TABLET | Freq: Every day | ORAL | 1 refills | Status: AC

## 2017-06-03 MED ORDER — MIDODRINE HCL 10 MG PO TABS: 10.0000 mg | ORAL_TABLET | Freq: Three times a day (TID) | ORAL | 1 refills | Status: AC

## 2017-06-03 MED ORDER — LIDOCAINE 5 % EX PTCH: 2.0000 | MEDICATED_PATCH | CUTANEOUS | 0 refills | Status: DC

## 2017-06-03 MED ORDER — METOPROLOL TARTRATE 25 MG PO TABS: 12.5000 mg | ORAL_TABLET | Freq: Two times a day (BID) | ORAL | 1 refills | Status: AC

## 2017-06-03 NOTE — Final Consult Note (Signed)
[facility] Progress Note  9 Days Post-Op  Subjective: CC: embolic stroke Patient no longer having any abdominal pain. Tolerating diet, although family notes she is not eating much. Lots of questions regarding general medical care and potential discharge.  AFib.   Objective: Vital signs in last 24 hours: Temp:  [98.1 F (36.7 C)-98.9 F (37.2 C)] 98.1 F (36.7 C) (01/31 0555) Pulse Rate:  [78-89] 89 (01/31 0555) Resp:  [18] 18 (01/31 0555) BP: (106-120)/(61-80) 106/63 (01/31 0555) SpO2:  [95 %-98 %] 98 % (01/31 0555) Last BM Date: 05/30/17  Intake/Output from previous day: 01/30 0701 - 01/31 0700 In: -  Out: 1200 [Urine:1200] Intake/Output this shift: No intake/output data recorded.  PE: Gen:  NAD, pleasant Card:  Irregularly irregular, pedal pulses 2+ BL Pulm:  Normal effort, clear to auscultation bilaterally Abd: Soft, non-tender, non-distended, bowel sounds present, no HSM Skin: warm and dry, no rashes   Lab Results:  Recent Labs    06/02/17 0347 06/03/17 0349  WBC 10.5 9.0  HGB 13.1 13.3  HCT 40.1 41.2  PLT 336 342   BMET Recent Labs    06/02/17 0347 06/03/17 0349  NA 141 143  K 3.3* 4.1  CL 106 109  CO2 25 24  GLUCOSE 95 96  BUN 13 14  CREATININE 0.63 0.61  CALCIUM 9.3 9.6   PT/INR No results for input(s): LABPROT, INR in the last 72 hours. CMP     Component Value Date/Time   NA 143 06/03/2017 0349   NA 142 06/19/2015 1021   K 4.1 06/03/2017 0349   CL 109 06/03/2017 0349   CO2 24 06/03/2017 0349   GLUCOSE 96 06/03/2017 0349   BUN 14 06/03/2017 0349   BUN 12 06/19/2015 1021   CREATININE 0.61 06/03/2017 0349   CREATININE 0.93 03/13/2013 1011   CALCIUM 9.6 06/03/2017 0349   PROT 5.5 (L) 06/02/2017 0347   PROT 6.6 06/19/2015 1021   ALBUMIN 2.5 (L) 06/02/2017 0347   ALBUMIN 4.1 06/19/2015 1021   AST 29 06/02/2017 0347   ALT 25 06/02/2017 0347   ALKPHOS 67 06/02/2017 0347   BILITOT 0.8 06/02/2017 0347   BILITOT <0.2  06/19/2015 1021   GFRNONAA >60 06/03/2017 0349   GFRAA >60 06/03/2017 0349   Lipase  No results found for: LIPASE     Studies/Results: Dg Chest 2 View  Result Date: 06/02/2017 CLINICAL DATA:  Probable artifacts overlying the right chest on a portable chest radiograph earlier today. EXAM: CHEST  2 VIEW COMPARISON:  Earlier today. FINDINGS: Significantly improved inspiration. The previously described probable artifacts on the right are no longer seen. Stable enlarged cardiac silhouette. The lungs are hyperexpanded with mildly prominent interstitial markings. Small bilateral pleural effusions. Diffuse osteopenia. Old, calcified left humeral head and neck infarct or enchondroma and right shoulder calcified loose body. Lower thoracic spine degenerative changes. IMPRESSION: 1. The previously described right chest densities were artifacts. 2. Cardiomegaly and changes of COPD with small bilateral pleural effusions. Electronically Signed   By: Claudie Revering M.D.   On: 06/02/2017 20:14   US Abdomen Complete  Result Date: 06/02/2017 CLINICAL DATA:  Abdominal pain EXAM: ABDOMEN ULTRASOUND COMPLETE COMPARISON:  None. FINDINGS: Gallbladder: Multiple shadowing gallstones measuring up to 14 mm. Gallbladder wall thickness upper normal at 2 to 3 mm. Sonographer reports a positive sonographic Murphy sign. Common bile duct: Diameter: 4 mm Liver: No focal lesion identified. Within normal limits in parenchymal echogenicity. Portal vein is patent on color Doppler imaging  with normal direction of blood flow towards the liver. IVC: No abnormality visualized. Pancreas: Visualized portion unremarkable. Spleen: Size and appearance within normal limits. Right Kidney: Length: 11.7 cm.  2.3 cm cyst identified. Left Kidney: Length: 13 cm. Echogenicity within normal limits. No mass or hydronephrosis visualized. Abdominal aorta: Atherosclerotic calcification noted in the wall of the vessel without aneurysm. Other findings: Right  pleural effusion evident. IMPRESSION: 1. Cholelithiasis with positive sonographic Murphy sign. This combination of findings is suggestive of acute cholecystitis. If clinical picture is equivocal, nuclear scintigraphy may prove helpful to further evaluate no associated biliary dilatation 2. Abdominal aortic atherosclerosis. 3. Right renal cyst. 4. Right pleural effusion. Electronically Signed   By: Misty Stanley M.D.   On: 06/02/2017 13:14   Dg Chest Port 1 View  Result Date: 06/02/2017 CLINICAL DATA:  Right-sided chest pain. EXAM: PORTABLE CHEST 1 VIEW COMPARISON:  05/30/2017 FINDINGS: The heart is mildly enlarged but stable. Stable tortuosity, calcification and ectasia of the thoracic aorta. New vague density involving the right hemithorax with a moderately sharp lateral margin. This could be artifact from something overlying the chest. There also appears to be some type of button artifact. Patchy bibasilar atelectasis and small effusions. Recommend dedicated upright PA and lateral chest film when able to better evaluate. The bony thorax is intact. Marked narrowing of the right humeroacromial space is likely due to rotator cuff disease. There is also a probable enchondroma in the left humeral head. IMPRESSION: Probable artifact overlying the right chest. Recommend repeat PA and lateral chest film when able. Cardiac enlargement and bibasilar atelectasis. Electronically Signed   By: Marijo Sanes M.D.   On: 06/02/2017 16:11   Dg Abd Portable 1v  Result Date: 06/01/2017 CLINICAL DATA:  Periumbilical pain EXAM: PORTABLE ABDOMEN - 1 VIEW COMPARISON:  None. FINDINGS: Contrast material within nondistended large bowel loops. No evidence of bowel obstruction. No free air organomegaly. IMPRESSION: No evidence of bowel obstruction or free air. Electronically Signed   By: Rolm Baptise M.D.   On: 06/01/2017 19:15    Anti-infectives: Anti-infectives (From admission, onward)   Start     Dose/Rate Route Frequency  Ordered Stop   06/01/17 1345  cephALEXin (KEFLEX) capsule 500 mg     500 mg Oral Every 12 hours 06/01/17 1344     06/01/17 0000  cephALEXin (KEFLEX) 500 MG capsule     500 mg Oral Every 12 hours 06/01/17 1353     05/30/17 1800  cefTRIAXone (ROCEPHIN) 1 g in dextrose 5 % 50 mL IVPB  Status:  Discontinued     1 g 100 mL/hr over 30 Minutes Intravenous Every 24 hours 05/30/17 1727 06/01/17 1344   05/25/17 1203  ceFAZolin (ANCEF) 2-4 GM/100ML-% IVPB    Comments:  Roe Coombs   : cabinet override      05/25/17 1203 05/26/17 0014       Assessment/Plan Acute embolic stroke ([place])   Middle cerebral artery embolism, right   Hypokalemia   Left hemiplegia ([place])  RUQ abdominal pain/Cholelithiasis - CXR yesterday with small bilateral pleural effusions - US showed cholelithiasis with + Murphy's sign, and wall thickness upper normal at 2-14mm. - today patient without pain and tolerating diet - WBC 9.0, afebrile - LFTs normal yesterday  FEN: DYS 1 diet VTE: SCDs ID: Ancef 1/22; Rocephin 1/27>1/29; PO Keflex 1/29>>  No signs of acute cholecystitis and patient is not a surgical candidate for chronic cholecystitis, we will sign off. If any concern for acute cholecystitis in  the future would recommend HIDA and likely percutaneous drainage.     LOS: 9 days    Brigid Re , [facility] 06/03/2017, 8:40 AM Pager: [phone] Consults: [phone] Mon-Fri 7:00 am-4:30 pm Sat-Sun 7:00 am-11:30 am

## 2017-06-03 NOTE — Clinical Social Work Placement (Signed)
   CLINICAL SOCIAL WORK PLACEMENT  NOTE 06/03/17 - DISCHARGED TO [facility] [place] VIA AMBULANCE  Date:  06/03/2017  Patient Details  Name: Chloe Lopez MRN: [id number] Date of Birth: 1924/04/10  Clinical Social Work is seeking post-discharge placement for this patient at the [place] level of care (*CSW will initial, date and re-position this form in  chart as items are completed):  Yes   Patient/family provided with [facility] Work Department's list of facilities offering this level of care within the geographic area requested by the patient (or if unable, by the patient's family).  Yes   Patient/family informed of their freedom to choose among providers that offer the needed level of care, that participate in Medicare, Medicaid or managed care program needed by the patient, have an available bed and are willing to accept the patient.  Yes   Patient/family informed of [facility]'s ownership interest in [facility], as well as of the fact that they are under no obligation to receive care at these facilities.  PASRR submitted to EDS on 05/31/17     PASRR number received on 05/31/17     Existing PASRR number confirmed on       FL2 transmitted to all facilities in geographic area requested by pt/family on 05/31/17     FL2 transmitted to all facilities within larger geographic area on       Patient informed that his/her managed care company has contracts with or will negotiate with certain facilities, including the following:        Yes   Patient/family informed of bed offers received.  Patient chooses bed at [facility]     Physician recommends and patient chooses bed at      Patient to be transferred to [facility] on 06/03/17.  Patient to be transferred to facility by Ambulance     Patient family notified on 06/03/17 of transfer.  Name of family member notified:  Family at the  bedside     PHYSICIAN       Additional Comment:    _______________________________________________ Sable Feil, LCSW 06/03/2017, 1:21 PM

## 2017-06-03 NOTE — Progress Notes (Signed)
RN gave report to Sol Passer, RN at [place] center

## 2017-06-03 NOTE — Progress Notes (Signed)
Occupational Therapy Treatment Patient Details Name: Chloe Lopez MRN: [id number] DOB: [birth year] Today's Date: 06/03/2017    History of present illness 84 yo female with chronic atrial fibrillation and neuropathy who was found by daughter with left hemiplegia. MRI showing Acute ischemic right MCA territory infarct involving the right. Unsuccessful revascularization. Pt intubated 1/22-1/23   OT comments  Worked on sitting balance EOB.  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  When daughter sat on her Rt, and therapist on her Lt, she was able to initiate weight shifts and progressed to mod A with periods of min A for sitting.  She was able to brush teeth EOB with mod A.  Min cues to look to Lt.  Recommend continued rehab at [facility].    Follow Up Recommendations  SNF(CIR denied admission )    Equipment Recommendations  None recommended by OT    Recommendations for Other Services      Precautions / Restrictions Precautions Precautions: Fall Precaution Comments: left hemiplegia, left inattention, very sensitive skin bil LE (does better if help her move from her feet), R pusher       Mobility Bed Mobility Overal bed mobility: Needs Assistance Bed Mobility: Supine to Sit;Sit to Supine     Supine to sit: Max assist Sit to supine: Max assist   General bed mobility comments: assist to move LEs on and off bed as well as to lift and lower trunk.  Pt with difficulty initiating movement when returning to supine   Transfers                 General transfer comment: did not attempt     Balance Overall balance assessment: Needs assistance Sitting-balance support: No upper extremity supported;Feet unsupported;Single extremity supported Sitting balance-Leahy Scale: Poor Sitting balance - Comments: Pt with pusher syndrome, and pushing heavily to the Lt.  Focused on engaging pt with activity that required Rt UE use to reduce pushing.  Therapist sat on Lt side.  Pt does  not respond well to input/facilitation to Lt trunk as she pushes harder or complains of pain  with touch.  Daughter sat on pt's Rt, and with instruction for her to keep her Rt shoulder against daughter's shoulder, she was able to initiate trunk movement/weight shifts and progressed to mod A sitting with brief periods of min A.    Postural control: Left lateral lean                                 ADL either performed or assessed with clinical judgement   ADL Overall ADL's : Needs assistance/impaired     Grooming: Oral care;Moderate assistance;Sitting Grooming Details (indicate cue type and reason): Pt rquires set up for task, mod cues to complete task.  She will stop in middle of task and require mod cues and assist to reinitiate task.  Mod A for sitting balance EOB                                General ADL Comments: daughter present and assisted      Vision   Additional Comments: Pt with Rt gaze preference.  She will turn to look to Lt with min cues    Perception     Praxis      Cognition Arousal/Alertness: Awake/alert Behavior During Therapy: Flat affect Overall Cognitive Status:  Impaired/Different from baseline Area of Impairment: Attention;Following commands;Safety/judgement;Awareness;Problem solving                   Current Attention Level: Sustained Memory: Decreased short-term memory Following Commands: Follows one step commands inconsistently;Follows one step commands consistently Safety/Judgement: Decreased awareness of safety;Decreased awareness of deficits Awareness: Intellectual Problem Solving: Slow processing;Decreased initiation;Difficulty sequencing;Requires verbal cues;Requires tactile cues General Comments: Pt required mod cues for one step commands when she was distracted by IV in Rt elbow `        Exercises     Shoulder Instructions       General Comments      Pertinent Vitals/ Pain       Pain Assessment:  Faces Faces Pain Scale: Hurts even more Pain Location: LEs with mobility Pain Descriptors / Indicators: Discomfort Pain Intervention(s): Repositioned  Home Living                                          Prior Functioning/Environment              Frequency  Min 2X/week        Progress Toward Goals  OT Goals(current goals can now be found in the care plan section)  Progress towards OT goals: Progressing toward goals     Plan Discharge plan needs to be updated    Co-evaluation                 AM-PAC PT "6 Clicks" Daily Activity     Outcome Measure   Help from another person eating meals?: A Lot Help from another person taking care of personal grooming?: A Lot Help from another person toileting, which includes using toliet, bedpan, or urinal?: Total Help from another person bathing (including washing, rinsing, drying)?: A Lot Help from another person to put on and taking off regular upper body clothing?: Total Help from another person to put on and taking off regular lower body clothing?: Total 6 Click Score: 9    End of Session    OT Visit Diagnosis: Unsteadiness on feet (R26.81);Other abnormalities of gait and mobility (R26.89);Muscle weakness (generalized) (M62.81);Other symptoms and signs involving cognitive function;Hemiplegia and hemiparesis Hemiplegia - Right/Left: Left Hemiplegia - dominant/non-dominant: Non-Dominant Hemiplegia - caused by: Cerebral infarction   Activity Tolerance Patient tolerated treatment well   Patient Left in bed;with call bell/phone within reach;with nursing/sitter in room;with family/visitor present   Nurse Communication Mobility status        Time: 8338-2505 OT Time Calculation (min): 53 min  Charges: OT General Charges $OT Visit: 1 Visit OT Treatments $Self Care/Home Management : 8-22 mins $Neuromuscular Re-education: 38-52 mins  [facility], OTR/L [phone]    Lucille Passy  M 06/03/2017, 3:23 PM

## 2017-06-03 NOTE — Progress Notes (Signed)
[facility] came to pick up Pt, RN attempted to give d/c information to caregiver/daughter Colletta Maryland. Copy of d/c instructions given along with printouts of all medications as family requested. Caregiver refused to sign discharge form that information was given and discussed.   Patient d/c with belongings, IV removed, no new concerns.

## 2017-06-03 NOTE — Care Management Note (Signed)
Case Management Note  Patient Details  Name: BRIONY PARVEEN MRN: [id number] Date of Birth: 06-Apr-1924  Subjective/Objective:                    Action/Plan: Pt discharged to [facility]. No further needs per CM.   Expected Discharge Date:  06/03/17               Expected Discharge Plan:  [place]  In-House Referral:     Discharge planning Services  CM Consult  Post Acute Care Choice:    Choice offered to:     DME Arranged:    DME Agency:     HH Arranged:    HH Agency:     Status of Service:  Completed, signed off  If discussed at [employer], dates discussed:    Additional Comments:  Pollie Friar, RN 06/03/2017, 3:22 PM

## 2017-06-03 NOTE — Progress Notes (Signed)
[facility] for Apixaban Indication: atrial fibrillation  Allergies  Allergen Reactions  . Azithromycin Swelling    Reportedly had reaction September 2017 - caused worsening angioedema. Unclear if it was this or the benzonatate she started at the same time.  . Benzonatate Swelling    Reportedly had reaction September 2017 - caused worsening angioedema. Unclear if it was this or the azithromycin she started at the same time.    Patient Measurements: Height: 5\' 5"  (165.1 cm) Weight: 151 lb 10.8 oz (68.8 kg) IBW/kg (Calculated) : 57  Vital Signs: Temp: 98.1 F (36.7 C) (01/31 0555) Temp Source: Oral (01/31 0555) BP: 106/63 (01/31 0555) Pulse Rate: 89 (01/31 0555)  Labs: Recent Labs    06/02/17 0347 06/03/17 0349  HGB 13.1 13.3  HCT 40.1 41.2  PLT 336 342  CREATININE 0.63 0.61    Estimated Creatinine Clearance: 43.7 mL/min (by C-G formula based on SCr of 0.61 mg/dL).   Assessment: 84 yo F admitted with left-sided weakness, right gaze, left facial droop and slurred speech.  CT / MRI showed multiple infarcts- tPA not given.  Noted to have a hx afib but not on anticoagulation PTA.  Age: >80, wt: >60kg, SCr <1.5- does NOT meet dose reduction requirements  Goal of Therapy:  Therapeutic Anticoagulation Monitor platelets by anticoagulation protocol: Yes   Plan:  Apixaban 5mg  PO BID Follow-up for s/sx of bleeding and monitor CBC intermittently Pharmacy to sign off from formal consult as no dose adjustments anticipated- will continue to follow peripherally  *Recommend discontinuing Keflex after today's doses as that will be 5 days of treatment for [facility] UTI.   Lonita Debes D. Latrecia Capito, PharmD, BCPS Clinical Pharmacist Clinical Phone for 06/03/2017 until 3:30pm: [phone] If after 3:30pm, please call main pharmacy at [phone] 06/03/2017 11:04 AM

## 2017-06-04 DIAGNOSIS — I10 Essential (primary) hypertension: Secondary | ICD-10-CM | POA: Diagnosis not present

## 2017-06-04 DIAGNOSIS — I63511 Cerebral infarction due to unspecified occlusion or stenosis of right middle cerebral artery: Secondary | ICD-10-CM | POA: Diagnosis not present

## 2017-06-04 DIAGNOSIS — N390 Urinary tract infection, site not specified: Secondary | ICD-10-CM | POA: Diagnosis not present

## 2017-06-04 DIAGNOSIS — I482 Chronic atrial fibrillation: Secondary | ICD-10-CM | POA: Diagnosis not present

## 2017-06-09 DIAGNOSIS — I4891 Unspecified atrial fibrillation: Secondary | ICD-10-CM | POA: Diagnosis not present

## 2017-06-09 DIAGNOSIS — I10 Essential (primary) hypertension: Secondary | ICD-10-CM | POA: Diagnosis not present

## 2017-06-09 DIAGNOSIS — I639 Cerebral infarction, unspecified: Secondary | ICD-10-CM | POA: Diagnosis not present

## 2017-06-09 DIAGNOSIS — E785 Hyperlipidemia, unspecified: Secondary | ICD-10-CM | POA: Diagnosis not present

## 2017-06-10 ENCOUNTER — Telehealth: Payer: Self-pay | Admitting: Neurology

## 2017-06-10 DIAGNOSIS — I63511 Cerebral infarction due to unspecified occlusion or stenosis of right middle cerebral artery: Secondary | ICD-10-CM | POA: Diagnosis not present

## 2017-06-10 DIAGNOSIS — G6289 Other specified polyneuropathies: Secondary | ICD-10-CM | POA: Diagnosis not present

## 2017-06-10 DIAGNOSIS — I482 Chronic atrial fibrillation: Secondary | ICD-10-CM | POA: Diagnosis not present

## 2017-06-10 DIAGNOSIS — I10 Essential (primary) hypertension: Secondary | ICD-10-CM | POA: Diagnosis not present

## 2017-06-10 NOTE — Telephone Encounter (Signed)
Pts daughter calling stating that pt was seen in the hospital by Dr. Erlinda Hong and then transferred to a local rehab center in [place]. Pts daughter is wanting to speak with someone concerning her care. Please contact at  [phone]

## 2017-06-10 NOTE — Telephone Encounter (Signed)
revised 

## 2017-06-10 NOTE — Telephone Encounter (Addendum)
Rn call Colletta Maryland at [phone] pts daughter. Colletta Maryland stated her mom is in [place] home. She stated pt saw Dr. Erlinda Hong in hospital and he wanted her blood pressure between 470-962 systolic for the cognitive healing of her mom after the stroke. Pts BP was check two days and it was 99 systolic. She was concern about it. Rn ask pts daughter what was her moms blood pressure on yesterday, and today. The daughter does not know what her blood pressure was for the last two days.  The daughter states she lives out of town and is not locally. She knows its check regularly.  Rn stated typically a pt is still healing within the first 90 days of the stroke,and the BP may vary. The daughter ask about her mom scheduling an appt. Rn stated pt is to follow up at [facility] 6 to 8 weeks after discharge. The daughter stated " at discharge no one told us we had to follow up at [facility]." Rn stated its on the discharge summary,and it was sent to the nursing home. I told her the referral staff have been calling the listed home number trying to schedule appt. Colletta Maryland stated that's her moms home number. She recommend referrals call her sister who is locally, named ,Wells Guiles at [phone]. Rn also stated that  [facility] has protocols how to treat patients who recently had a stroke.Rn also stated [facility] will call our office if there any issues with the pt relating to the stroke. RN stated a message will be given to pt referrals to call Wells Guiles to schedule her moms appt. Colletta Maryland verbalized understanding.

## 2017-06-14 ENCOUNTER — Telehealth: Payer: Self-pay | Admitting: Family Medicine

## 2017-06-14 NOTE — Telephone Encounter (Signed)
Pt was discharged about a week ago from hospital from having a stroke.  She is currently a resident at [facility].  On her discharge notes, she was to followup with dr with a week after discharge.  Dr Ardelia Mems doesn't have any appts until 06-25-17.  Does she need to see another dr or wait until the 22 to see [facility]?  Also she will hard to move and get here. Will there be a way for a dr to come to [facility] and see pt?  Daughter Chloe Lopez would like to talk to dr Ardelia Mems about the meds mom is on.  Mom is sometimes hallucinating.

## 2017-06-15 NOTE — Telephone Encounter (Signed)
Called and spoke with [place]. Chloe Lopez is in a lot of pain and her tylenol is not being given consistently, nor is tramadol. It sounds as if she is having intermittent delirium in the SNF. Has dense left arm paralysis after her stroke.  Encouraged Becky to speak with the SNF doctor about scheduling tylenol at least in the AM, if not multiple times per day, and to schedule tramadol at night with hold parameters if she is already sedated/asleep. Chloe Lopez will continue to be seen by SNF doctors and they will call to schedule with me if/when she is discharged from the facility.  Chloe Lopez was very appreciative of the call.  Leeanne Rio, MD

## 2017-06-17 DIAGNOSIS — I63511 Cerebral infarction due to unspecified occlusion or stenosis of right middle cerebral artery: Secondary | ICD-10-CM | POA: Diagnosis not present

## 2017-06-17 DIAGNOSIS — I10 Essential (primary) hypertension: Secondary | ICD-10-CM | POA: Diagnosis not present

## 2017-06-17 DIAGNOSIS — I482 Chronic atrial fibrillation: Secondary | ICD-10-CM | POA: Diagnosis not present

## 2017-06-17 DIAGNOSIS — G6289 Other specified polyneuropathies: Secondary | ICD-10-CM | POA: Diagnosis not present

## 2017-06-21 DIAGNOSIS — I482 Chronic atrial fibrillation: Secondary | ICD-10-CM | POA: Diagnosis not present

## 2017-06-21 DIAGNOSIS — I63511 Cerebral infarction due to unspecified occlusion or stenosis of right middle cerebral artery: Secondary | ICD-10-CM | POA: Diagnosis not present

## 2017-06-21 DIAGNOSIS — I959 Hypotension, unspecified: Secondary | ICD-10-CM | POA: Diagnosis not present

## 2017-06-21 DIAGNOSIS — M79601 Pain in right arm: Secondary | ICD-10-CM | POA: Diagnosis not present

## 2017-06-22 ENCOUNTER — Telehealth: Payer: Self-pay

## 2017-06-22 NOTE — Telephone Encounter (Signed)
Yvette from [facility] left a message on nurse line requesting a referral to Hospice for this patient. Patient is currently at [facility] being discharged tomorrow and will then go to [facility] for 15 days of respite care and then to daughter's home. Call back number for questions is [phone]. Danley Danker, RN [facility] RN)

## 2017-06-23 NOTE — Telephone Encounter (Signed)
Called [facility] to place referral, was advised to fax over referral form. Printed hospitalization discharge summary to send with referral form. Will place these in the to-fax pile.  Leeanne Rio, MD

## 2017-06-24 DIAGNOSIS — R531 Weakness: Secondary | ICD-10-CM | POA: Diagnosis not present

## 2017-06-25 DIAGNOSIS — R54 Age-related physical debility: Secondary | ICD-10-CM | POA: Diagnosis not present

## 2017-06-25 DIAGNOSIS — G9009 Other idiopathic peripheral autonomic neuropathy: Secondary | ICD-10-CM | POA: Diagnosis not present

## 2017-06-25 DIAGNOSIS — R1310 Dysphagia, unspecified: Secondary | ICD-10-CM | POA: Diagnosis not present

## 2017-06-25 DIAGNOSIS — I951 Orthostatic hypotension: Secondary | ICD-10-CM | POA: Diagnosis not present

## 2017-06-25 DIAGNOSIS — E785 Hyperlipidemia, unspecified: Secondary | ICD-10-CM | POA: Diagnosis not present

## 2017-06-25 DIAGNOSIS — K219 Gastro-esophageal reflux disease without esophagitis: Secondary | ICD-10-CM | POA: Diagnosis not present

## 2017-06-25 DIAGNOSIS — Z853 Personal history of malignant neoplasm of breast: Secondary | ICD-10-CM | POA: Diagnosis not present

## 2017-06-25 DIAGNOSIS — I679 Cerebrovascular disease, unspecified: Secondary | ICD-10-CM | POA: Diagnosis not present

## 2017-06-25 DIAGNOSIS — I69954 Hemiplegia and hemiparesis following unspecified cerebrovascular disease affecting left non-dominant side: Secondary | ICD-10-CM | POA: Diagnosis not present

## 2017-06-25 DIAGNOSIS — H3530 Unspecified macular degeneration: Secondary | ICD-10-CM | POA: Diagnosis not present

## 2017-06-25 DIAGNOSIS — D841 Defects in the complement system: Secondary | ICD-10-CM | POA: Diagnosis not present

## 2017-06-25 DIAGNOSIS — I4891 Unspecified atrial fibrillation: Secondary | ICD-10-CM | POA: Diagnosis not present

## 2017-06-25 DIAGNOSIS — I10 Essential (primary) hypertension: Secondary | ICD-10-CM | POA: Diagnosis not present

## 2017-06-25 DIAGNOSIS — F339 Major depressive disorder, recurrent, unspecified: Secondary | ICD-10-CM | POA: Diagnosis not present

## 2017-06-26 DIAGNOSIS — I679 Cerebrovascular disease, unspecified: Secondary | ICD-10-CM | POA: Diagnosis not present

## 2017-06-26 DIAGNOSIS — G9009 Other idiopathic peripheral autonomic neuropathy: Secondary | ICD-10-CM | POA: Diagnosis not present

## 2017-06-26 DIAGNOSIS — I951 Orthostatic hypotension: Secondary | ICD-10-CM | POA: Diagnosis not present

## 2017-06-26 DIAGNOSIS — I10 Essential (primary) hypertension: Secondary | ICD-10-CM | POA: Diagnosis not present

## 2017-06-26 DIAGNOSIS — I4891 Unspecified atrial fibrillation: Secondary | ICD-10-CM | POA: Diagnosis not present

## 2017-06-26 DIAGNOSIS — E785 Hyperlipidemia, unspecified: Secondary | ICD-10-CM | POA: Diagnosis not present

## 2017-06-27 DIAGNOSIS — I951 Orthostatic hypotension: Secondary | ICD-10-CM | POA: Diagnosis not present

## 2017-06-27 DIAGNOSIS — I4891 Unspecified atrial fibrillation: Secondary | ICD-10-CM | POA: Diagnosis not present

## 2017-06-27 DIAGNOSIS — I679 Cerebrovascular disease, unspecified: Secondary | ICD-10-CM | POA: Diagnosis not present

## 2017-06-27 DIAGNOSIS — G9009 Other idiopathic peripheral autonomic neuropathy: Secondary | ICD-10-CM | POA: Diagnosis not present

## 2017-06-27 DIAGNOSIS — E785 Hyperlipidemia, unspecified: Secondary | ICD-10-CM | POA: Diagnosis not present

## 2017-06-27 DIAGNOSIS — I10 Essential (primary) hypertension: Secondary | ICD-10-CM | POA: Diagnosis not present

## 2017-06-28 DIAGNOSIS — I951 Orthostatic hypotension: Secondary | ICD-10-CM | POA: Diagnosis not present

## 2017-06-28 DIAGNOSIS — G9009 Other idiopathic peripheral autonomic neuropathy: Secondary | ICD-10-CM | POA: Diagnosis not present

## 2017-06-28 DIAGNOSIS — I679 Cerebrovascular disease, unspecified: Secondary | ICD-10-CM | POA: Diagnosis not present

## 2017-06-28 DIAGNOSIS — E785 Hyperlipidemia, unspecified: Secondary | ICD-10-CM | POA: Diagnosis not present

## 2017-06-28 DIAGNOSIS — I10 Essential (primary) hypertension: Secondary | ICD-10-CM | POA: Diagnosis not present

## 2017-06-28 DIAGNOSIS — I4891 Unspecified atrial fibrillation: Secondary | ICD-10-CM | POA: Diagnosis not present

## 2017-06-29 DIAGNOSIS — I4891 Unspecified atrial fibrillation: Secondary | ICD-10-CM | POA: Diagnosis not present

## 2017-06-29 DIAGNOSIS — E785 Hyperlipidemia, unspecified: Secondary | ICD-10-CM | POA: Diagnosis not present

## 2017-06-29 DIAGNOSIS — I679 Cerebrovascular disease, unspecified: Secondary | ICD-10-CM | POA: Diagnosis not present

## 2017-06-29 DIAGNOSIS — I951 Orthostatic hypotension: Secondary | ICD-10-CM | POA: Diagnosis not present

## 2017-06-29 DIAGNOSIS — I10 Essential (primary) hypertension: Secondary | ICD-10-CM | POA: Diagnosis not present

## 2017-06-29 DIAGNOSIS — G9009 Other idiopathic peripheral autonomic neuropathy: Secondary | ICD-10-CM | POA: Diagnosis not present

## 2017-06-30 DIAGNOSIS — I10 Essential (primary) hypertension: Secondary | ICD-10-CM | POA: Diagnosis not present

## 2017-06-30 DIAGNOSIS — G9009 Other idiopathic peripheral autonomic neuropathy: Secondary | ICD-10-CM | POA: Diagnosis not present

## 2017-06-30 DIAGNOSIS — E785 Hyperlipidemia, unspecified: Secondary | ICD-10-CM | POA: Diagnosis not present

## 2017-06-30 DIAGNOSIS — I951 Orthostatic hypotension: Secondary | ICD-10-CM | POA: Diagnosis not present

## 2017-06-30 DIAGNOSIS — I679 Cerebrovascular disease, unspecified: Secondary | ICD-10-CM | POA: Diagnosis not present

## 2017-06-30 DIAGNOSIS — I4891 Unspecified atrial fibrillation: Secondary | ICD-10-CM | POA: Diagnosis not present

## 2017-07-01 ENCOUNTER — Telehealth: Payer: Self-pay | Admitting: Family Medicine

## 2017-07-01 DIAGNOSIS — I10 Essential (primary) hypertension: Secondary | ICD-10-CM | POA: Diagnosis not present

## 2017-07-01 DIAGNOSIS — E785 Hyperlipidemia, unspecified: Secondary | ICD-10-CM | POA: Diagnosis not present

## 2017-07-01 DIAGNOSIS — I679 Cerebrovascular disease, unspecified: Secondary | ICD-10-CM | POA: Diagnosis not present

## 2017-07-01 DIAGNOSIS — I4891 Unspecified atrial fibrillation: Secondary | ICD-10-CM | POA: Diagnosis not present

## 2017-07-01 DIAGNOSIS — G9009 Other idiopathic peripheral autonomic neuropathy: Secondary | ICD-10-CM | POA: Diagnosis not present

## 2017-07-01 DIAGNOSIS — I951 Orthostatic hypotension: Secondary | ICD-10-CM | POA: Diagnosis not present

## 2017-07-01 MED ORDER — BISACODYL 10 MG RE SUPP: 10.0000 mg | Freq: Every day | RECTAL | Status: AC | PRN

## 2017-07-01 MED ORDER — TRAMADOL HCL 50 MG PO TABS: 50.0000 mg | ORAL_TABLET | Freq: Two times a day (BID) | ORAL | 0 refills | Status: AC

## 2017-07-01 MED ORDER — ACETAMINOPHEN 325 MG PO TABS: 650.0000 mg | ORAL_TABLET | Freq: Two times a day (BID) | ORAL | Status: AC

## 2017-07-01 MED ORDER — LIDOCAINE 5 % EX PTCH: 1.0000 | MEDICATED_PATCH | CUTANEOUS | Status: AC

## 2017-07-01 NOTE — Telephone Encounter (Signed)
Received orders to sign for [facility]. Orders are copied below for documentation purposes.  Leeanne Rio, MD

## 2017-07-02 DIAGNOSIS — I4891 Unspecified atrial fibrillation: Secondary | ICD-10-CM | POA: Diagnosis not present

## 2017-07-02 DIAGNOSIS — F339 Major depressive disorder, recurrent, unspecified: Secondary | ICD-10-CM | POA: Diagnosis not present

## 2017-07-02 DIAGNOSIS — I10 Essential (primary) hypertension: Secondary | ICD-10-CM | POA: Diagnosis not present

## 2017-07-02 DIAGNOSIS — Z853 Personal history of malignant neoplasm of breast: Secondary | ICD-10-CM | POA: Diagnosis not present

## 2017-07-02 DIAGNOSIS — I69954 Hemiplegia and hemiparesis following unspecified cerebrovascular disease affecting left non-dominant side: Secondary | ICD-10-CM | POA: Diagnosis not present

## 2017-07-02 DIAGNOSIS — D841 Defects in the complement system: Secondary | ICD-10-CM | POA: Diagnosis not present

## 2017-07-02 DIAGNOSIS — G9009 Other idiopathic peripheral autonomic neuropathy: Secondary | ICD-10-CM | POA: Diagnosis not present

## 2017-07-02 DIAGNOSIS — R54 Age-related physical debility: Secondary | ICD-10-CM | POA: Diagnosis not present

## 2017-07-02 DIAGNOSIS — H3530 Unspecified macular degeneration: Secondary | ICD-10-CM | POA: Diagnosis not present

## 2017-07-02 DIAGNOSIS — R1310 Dysphagia, unspecified: Secondary | ICD-10-CM | POA: Diagnosis not present

## 2017-07-02 DIAGNOSIS — K219 Gastro-esophageal reflux disease without esophagitis: Secondary | ICD-10-CM | POA: Diagnosis not present

## 2017-07-02 DIAGNOSIS — E785 Hyperlipidemia, unspecified: Secondary | ICD-10-CM | POA: Diagnosis not present

## 2017-07-02 DIAGNOSIS — I951 Orthostatic hypotension: Secondary | ICD-10-CM | POA: Diagnosis not present

## 2017-07-02 DIAGNOSIS — I679 Cerebrovascular disease, unspecified: Secondary | ICD-10-CM | POA: Diagnosis not present

## 2017-07-03 DIAGNOSIS — I4891 Unspecified atrial fibrillation: Secondary | ICD-10-CM | POA: Diagnosis not present

## 2017-07-03 DIAGNOSIS — I679 Cerebrovascular disease, unspecified: Secondary | ICD-10-CM | POA: Diagnosis not present

## 2017-07-03 DIAGNOSIS — I10 Essential (primary) hypertension: Secondary | ICD-10-CM | POA: Diagnosis not present

## 2017-07-03 DIAGNOSIS — G9009 Other idiopathic peripheral autonomic neuropathy: Secondary | ICD-10-CM | POA: Diagnosis not present

## 2017-07-03 DIAGNOSIS — E785 Hyperlipidemia, unspecified: Secondary | ICD-10-CM | POA: Diagnosis not present

## 2017-07-03 DIAGNOSIS — I951 Orthostatic hypotension: Secondary | ICD-10-CM | POA: Diagnosis not present

## 2017-07-04 DIAGNOSIS — I951 Orthostatic hypotension: Secondary | ICD-10-CM | POA: Diagnosis not present

## 2017-07-04 DIAGNOSIS — I679 Cerebrovascular disease, unspecified: Secondary | ICD-10-CM | POA: Diagnosis not present

## 2017-07-04 DIAGNOSIS — I4891 Unspecified atrial fibrillation: Secondary | ICD-10-CM | POA: Diagnosis not present

## 2017-07-04 DIAGNOSIS — G9009 Other idiopathic peripheral autonomic neuropathy: Secondary | ICD-10-CM | POA: Diagnosis not present

## 2017-07-04 DIAGNOSIS — I10 Essential (primary) hypertension: Secondary | ICD-10-CM | POA: Diagnosis not present

## 2017-07-04 DIAGNOSIS — E785 Hyperlipidemia, unspecified: Secondary | ICD-10-CM | POA: Diagnosis not present

## 2017-07-05 ENCOUNTER — Telehealth: Payer: Self-pay | Admitting: Family Medicine

## 2017-07-05 NOTE — Telephone Encounter (Signed)
Death Certificate was dropped off and has been placed in PCP's box for completion. Please use blue or black ink. Please return to [facility] once completed.

## 2017-07-07 ENCOUNTER — Telehealth: Payer: Self-pay | Admitting: Neurology

## 2017-07-07 NOTE — Telephone Encounter (Signed)
Pt's daughter called to advise the pt died on 08-Jul-2017. Upcoming appt has been c/a

## 2017-07-07 NOTE — Telephone Encounter (Signed)
FYI patient decease.

## 2017-07-07 NOTE — Telephone Encounter (Signed)
Put on Lynette's desk

## 2017-07-21 ENCOUNTER — Telehealth: Payer: Self-pay | Admitting: Family Medicine

## 2017-07-21 NOTE — Telephone Encounter (Signed)
Pt daughter, Wells Guiles, called concerning her mothers passing. She called to let Dr Ardelia Mems know that she had a stroke and passed away. Wells Guiles has a form that needs to be completed for insurance purposes and she believes it needs to be filled out by Ardelia Mems, but isn't exactly sure. Please call Wells Guiles back at [phone] to discuss this.

## 2017-07-22 NOTE — Telephone Encounter (Signed)
Called Chloe Lopez back and spoke with her. The form is regarding getting money back on a trip they had to cancel while her mom was sick. I told her I'd be happy to fill it out. She's going to drop it off tomorrow for me to take a look at.  We had a good conversation reflecting on Chloe Lopez's life. Offered comforting words to Chloe Lopez. She appreciated the call.  Leeanne Rio, MD

## 2017-08-02 DEATH — deceased

## 2017-08-04 ENCOUNTER — Ambulatory Visit: Payer: Medicare Other | Admitting: Neurology

## 2020-01-12 IMAGING — MR MR HEAD W/O CM
8 of 10 series · 40 of 48 positions shown · non-contrast
Comparison: Comparison made with prior CTs from earlier the same
day.

CLINICAL DATA: Initial evaluation for acute left-sided weakness,
found have right ICA occlusion with right MCA embolism, status post
attempted catheter directed revascularization.

EXAM:
MRI HEAD WITHOUT CONTRAST
TECHNIQUE: Multiplanar, multiecho pulse sequences of the brain and surrounding
structures were obtained without intravenous contrast.

[Series 3: DWI · axial · 3.0mm · 1.09mm/px · z∈[+7,+150]mm · 11 of 98 slices shown (1 of 4)]
[im 1/98]
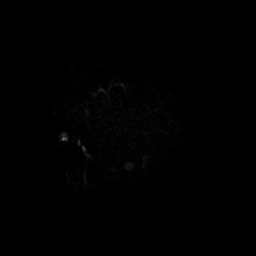
[im 10/98]
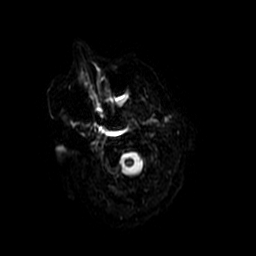
[im 20/98]
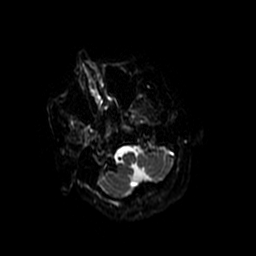
[im 30/98]
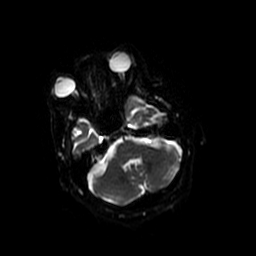
[im 39/98]
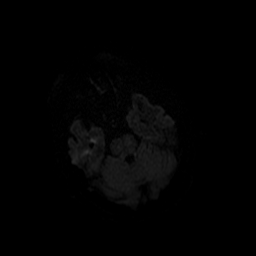
[im 49/98]
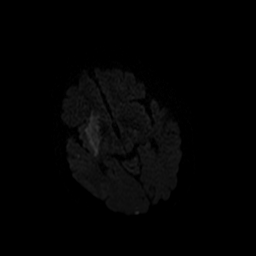
[im 59/98]
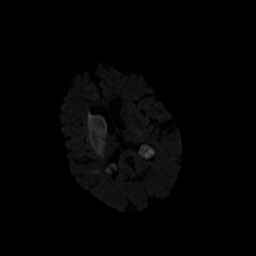
[im 68/98]
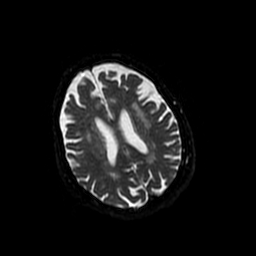
[im 78/98]
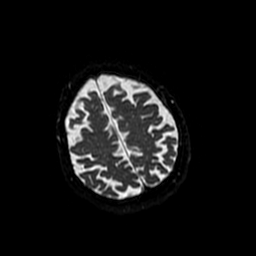
[im 88/98]
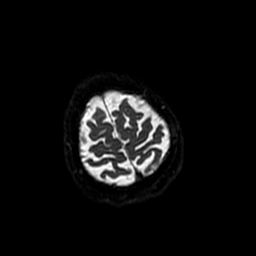
[im 98/98]
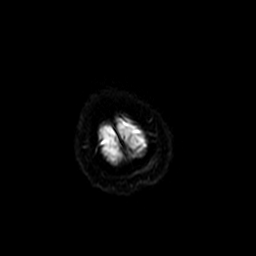

[Series 4: DWI · coronal · 5.0mm · 1.09mm/px · 8 of 68 slices shown (2 of 4)]
[im 1/68]
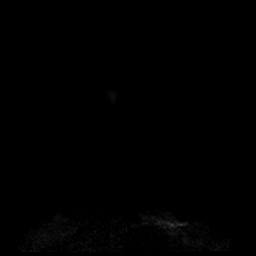
[im 10/68]
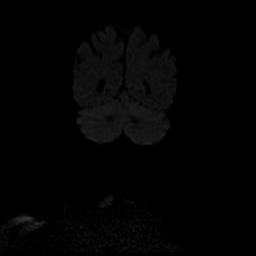
[im 20/68]
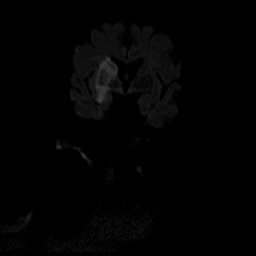
[im 29/68]
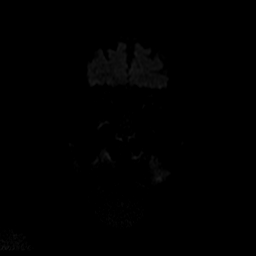
[im 39/68]
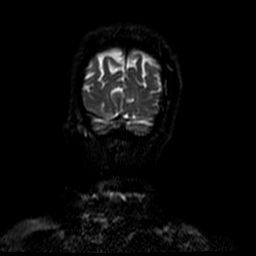
[im 48/68]
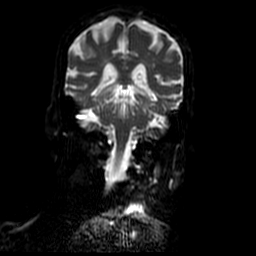
[im 58/68]
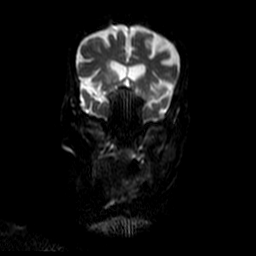
[im 68/68]
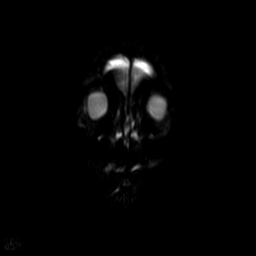

[Series 5: T2 · axial · 5.0mm · 0.43mm/px · z∈[+4,+70]mm · 2 of 24 slices shown]
[im 1/24]
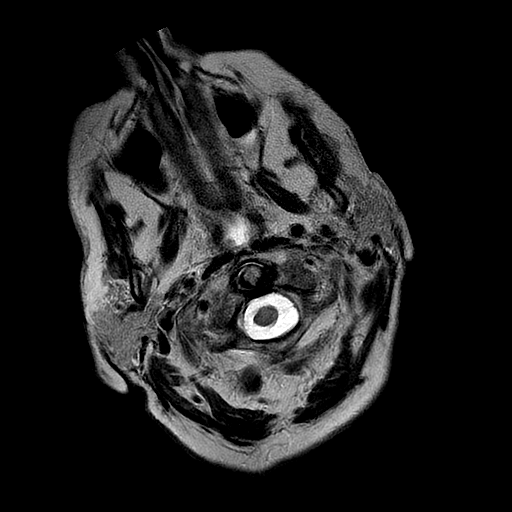
[im 12/24]
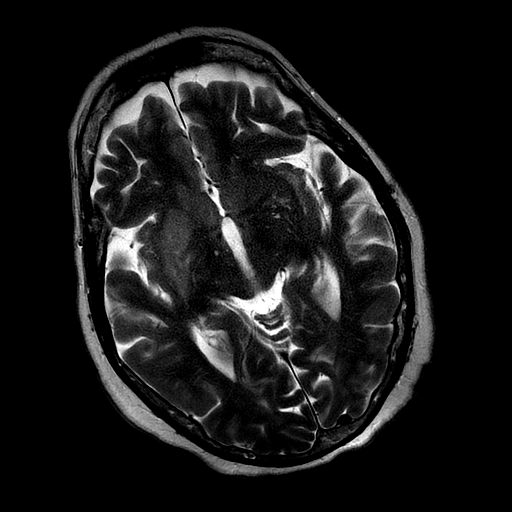

[Series 6: FLAIR · axial · 5.0mm · 0.43mm/px · z∈[+4,+142]mm · 3 of 24 slices shown (1 of 3)]
[im 1/24]
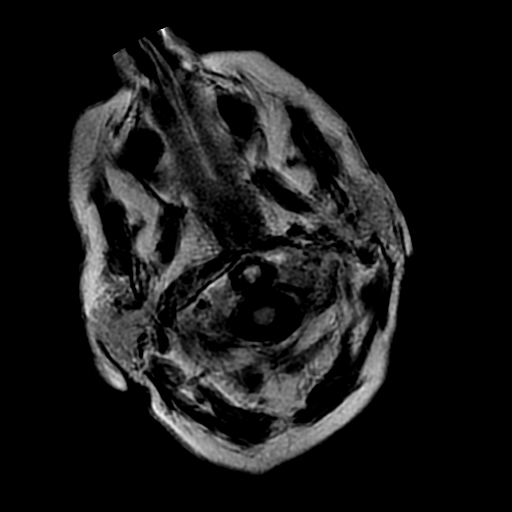
[im 12/24]
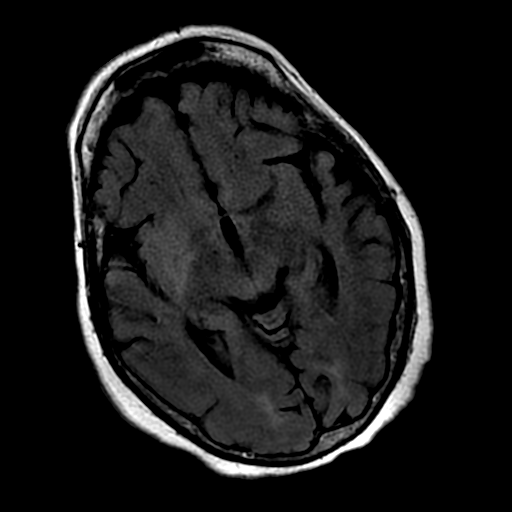
[im 24/24]
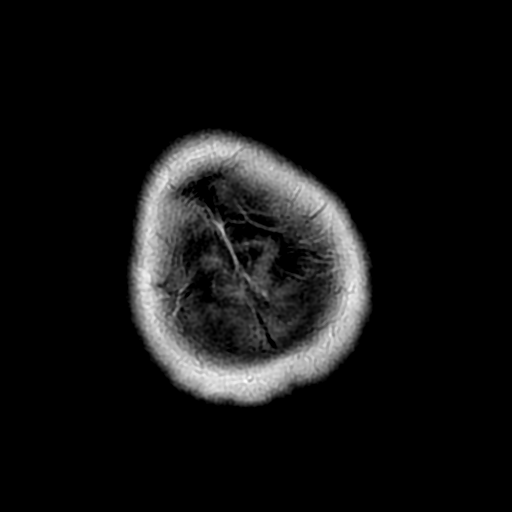

[Series 8: FLAIR · axial · 5.0mm · 0.43mm/px · z∈[+4,+142]mm · 3 of 24 slices shown (2 of 3)]
[im 1/24]
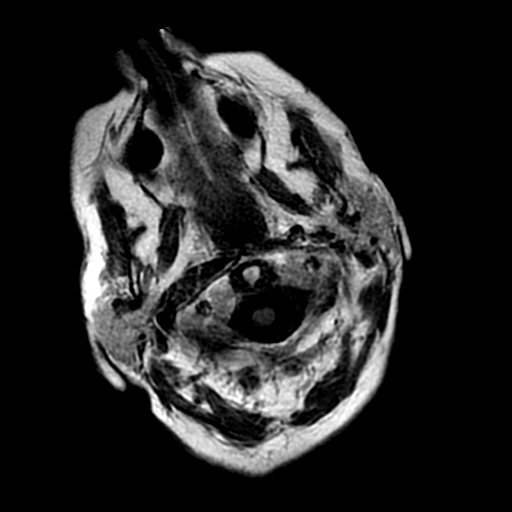
[im 12/24]
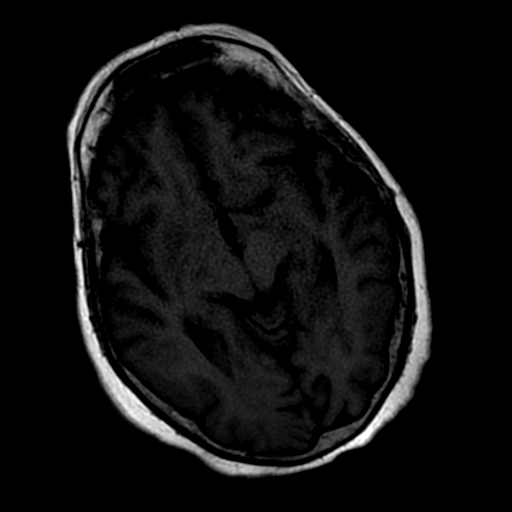
[im 24/24]
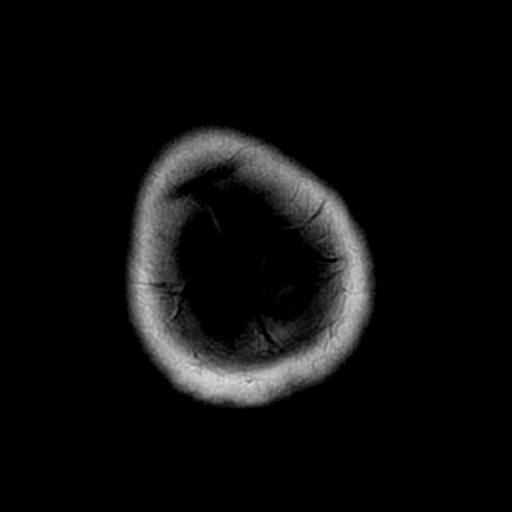

[Series 9: FLAIR · sagittal · 5.0mm · 0.94mm/px · 3 of 23 slices shown (3 of 3)]
[im 1/23]
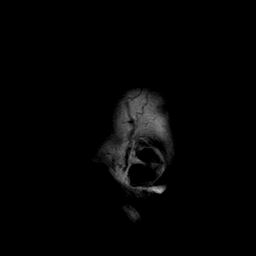
[im 12/23]
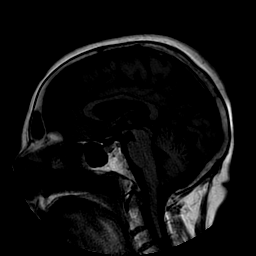
[im 23/23]
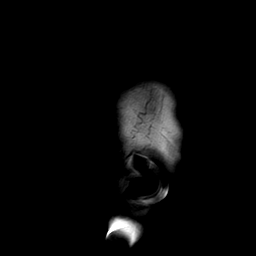

[Series 300: DWI · axial · 3.0mm · 1.09mm/px · z∈[+7,+150]mm · 6 of 49 slices shown (3 of 4)]
[im 1/49]
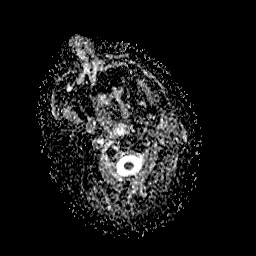
[im 10/49]
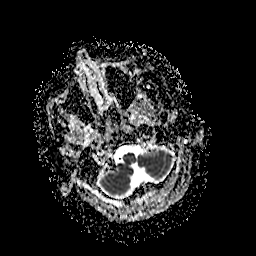
[im 20/49]
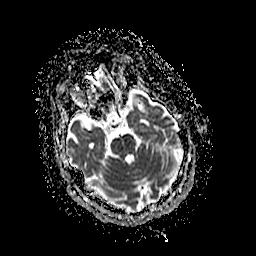
[im 29/49]
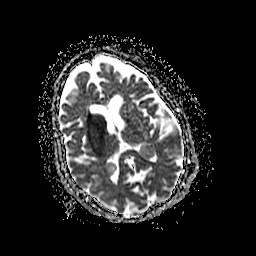
[im 39/49]
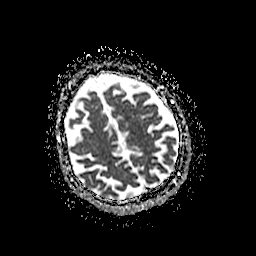
[im 49/49]
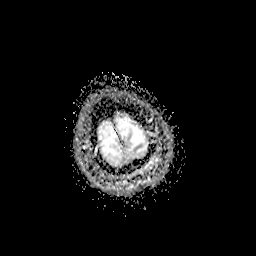

[Series 400: DWI · coronal · 5.0mm · 1.09mm/px · 4 of 33 slices shown (4 of 4)]
[im 1/33]
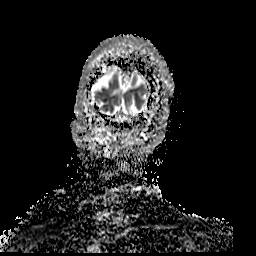
[im 11/33]
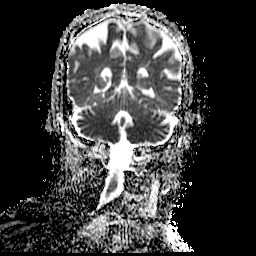
[im 22/33]
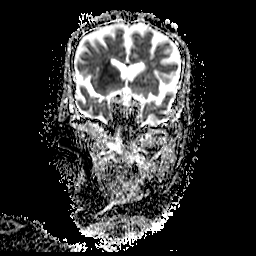
[im 33/33]
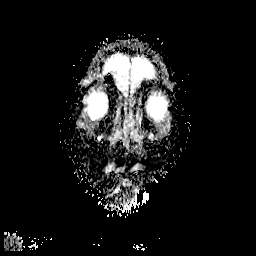

[40 of 48 positions shown; findings below may reference images not displayed]

FINDINGS: Brain: Generalized age-related cerebral atrophy. Patchy and
confluent T2/FLAIR hyperintensity within the periventricular and
deep white matter both cerebral hemispheres, most consistent with
chronic small vessel ischemic disease, mild to moderate nature.

Abnormal restricted diffusion involving the right caudate and
lentiform nucleus, extending into the right subinsular white matter,
consistent with acute ischemic infarct. Area of infarction measures
4.8 x 2.3 x 5.2 cm. Localized edema without significant mass effect.
No associated hemorrhage.

Additional patchy subcentimeter cortical and subcortical infarcts
seen within the overlying right cerebral hemisphere with involvement
of the right frontal parietal, and occipital lobes. No associated
edema or mass effect. Single subcentimeter nonhemorrhagic punctate
cortical infarct noted within the left parietal lobe as well (series
3, image 40).

No other evidence for acute or subacute ischemia. Gray-white matter
differentiation otherwise maintained. No mass lesion, midline shift
or mass effect. No hydrocephalus. No extra-axial fluid collection.

Pituitary gland suprasellar region normal. Midline structures intact
and normal.

Vascular: Abnormal flow void within the right ICA to the level of
the terminus, consistent with known occlusion. Right M1 segment also
appears occluded. Normal intravascular flow voids seen within the
remainder of the major intracranial arterial vasculature at the
skull base.

Skull and upper cervical spine: Craniocervical junction normal.
Upper cervical spine normal. Bone marrow signal intensity normal. No
scalp soft tissue abnormality.

Sinuses/Orbits: Globes oral soft tissues within normal limits.
Patient status post cataract extraction bilaterally. Air-fluid level
noted within the left maxillary sinus. Layering fluid noted within
the nasopharynx. Patient likely intubated. Trace opacity noted
within the right mastoid air cells. Inner ear structures normal.

Other: None.
IMPRESSION: 1. Acute ischemic right MCA territory infarct involving the right
basal ganglia, with additional scattered subcentimeter cortical
subcortical infarcts within the overlying right cerebral hemisphere.
No associated hemorrhage or mass effect.
2. Single punctate nonhemorrhagic left parietal cortical infarct as
above.
3. Abnormal flow voids within the right ICA and M1 segment,
consistent with previously identified occlusion.
4. Age-related atrophy with mild to moderate chronic small vessel
ischemic disease.

## 2020-01-12 IMAGING — CT CT HEAD CODE STROKE
3 series · 14 of 47 positions shown, 16 images · non-contrast
Comparison: MRI head 01/03/2011

ADDENDUM:
Hypodensity in the anterior limb right internal capsule and right
insula suggestive of acute infarct.

Updated aspects score 8
These results were called by telephone at the time of interpretation
on 05/25/2017 at [DATE] to Dr. Miholics, who verbally acknowledged
these results.
CLINICAL DATA: Code stroke.  Aphasia.  Right-sided gaze
EXAM:
CT HEAD WITHOUT CONTRAST
TECHNIQUE: Contiguous axial images were obtained from the base of the skull
through the vertex without intravenous contrast.

[Series 3: head 5.0 st · axial · 0.42mm/px · z∈[-150,-15]mm · 8 of 33 slices shown, 10 images]
[im 3/33  brain]
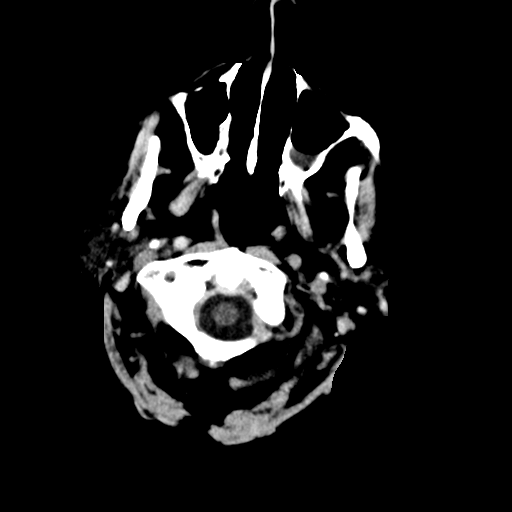
[im 3/33  bone]
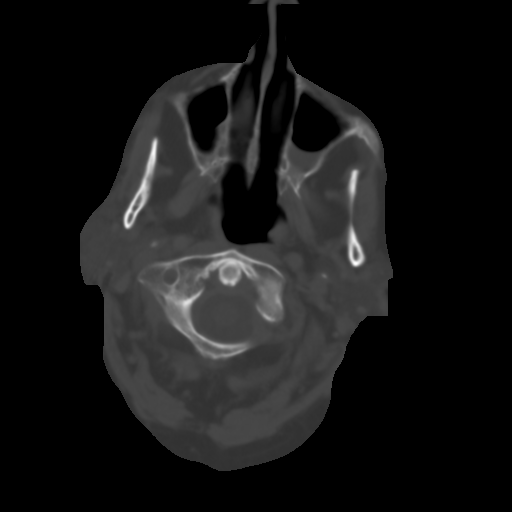
[im 7/33  brain]
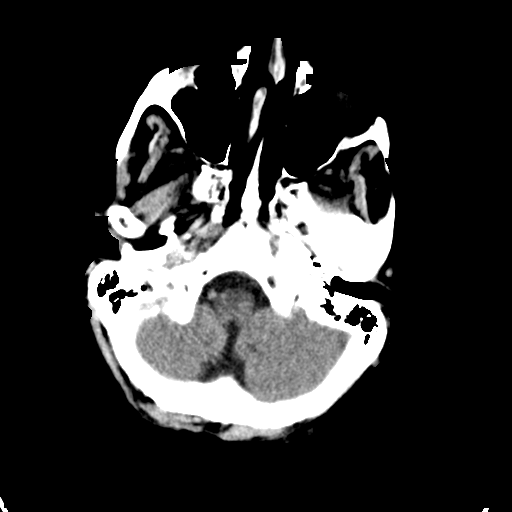
[im 10/33  brain]
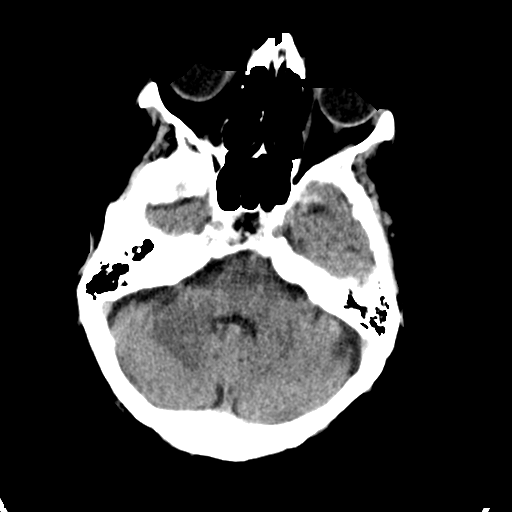
[im 15/33  brain]
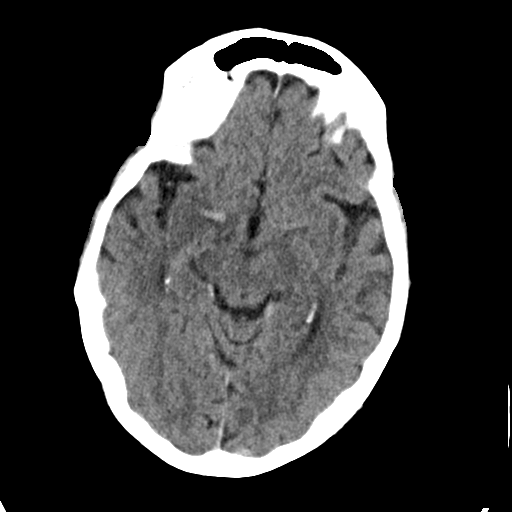
[im 18/33  brain]
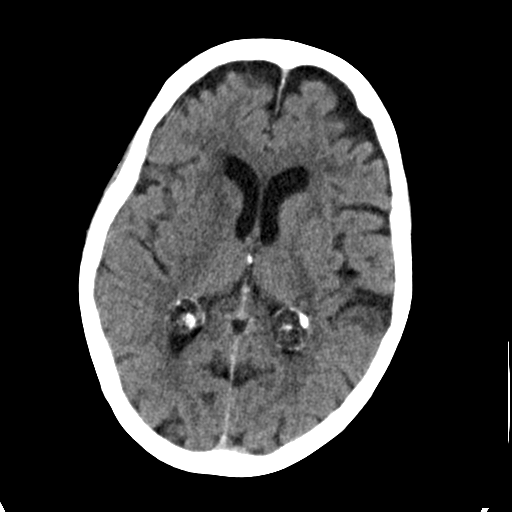
[im 18/33  bone]
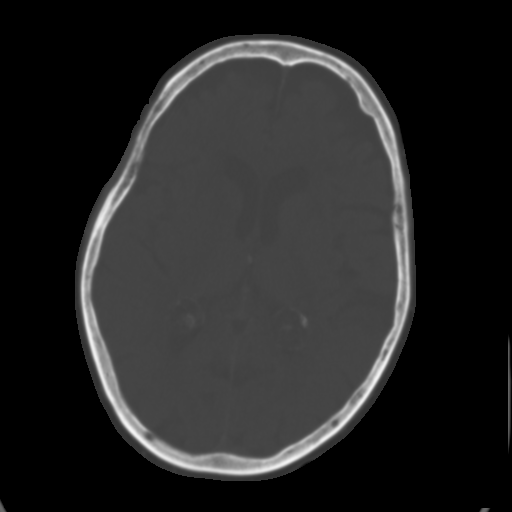
[im 23/33  brain]
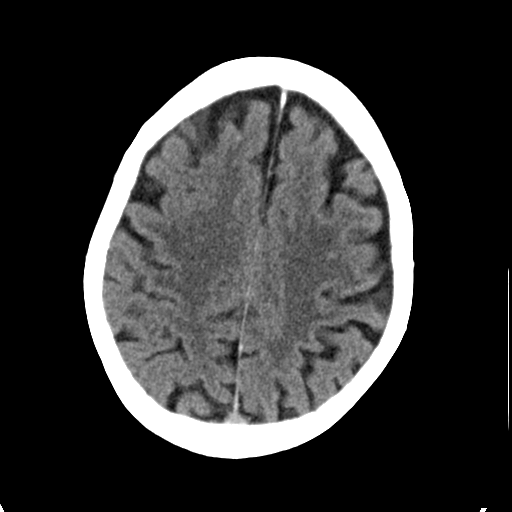
[im 26/33  brain]
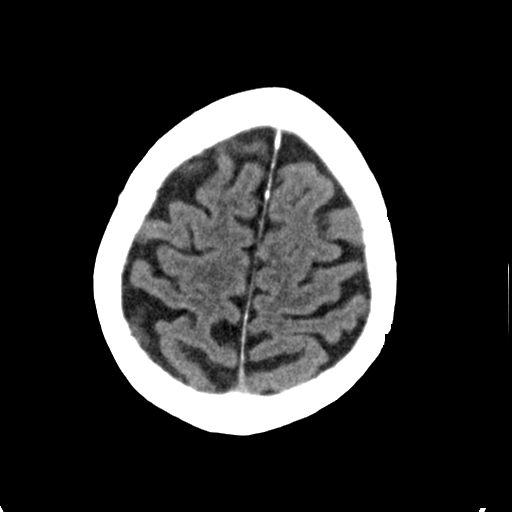
[im 30/33  brain]
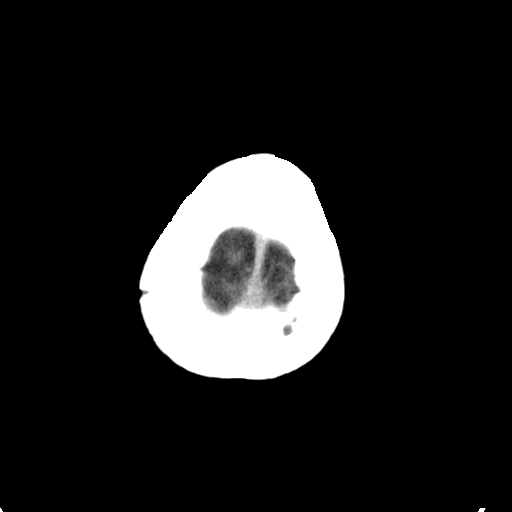

[Series 5: head 3.0 cor st · coronal · 0.32mm/px · 3 of 67 slices shown]
[im 23/67  brain]
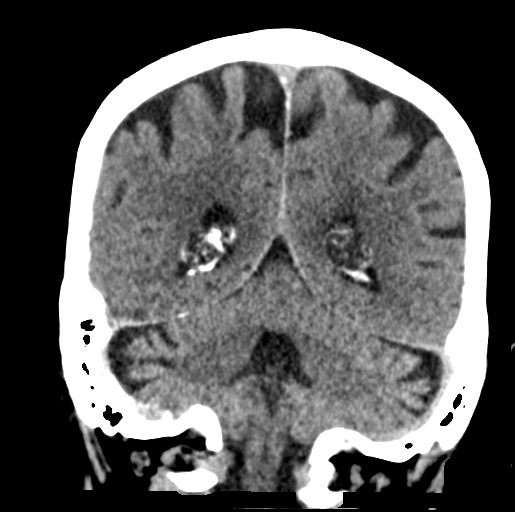
[im 30/67  brain]
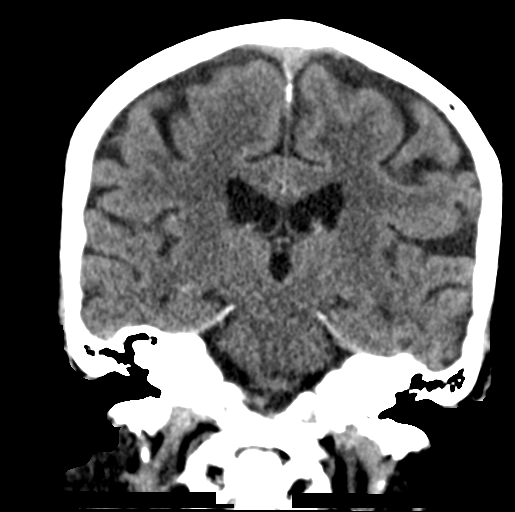
[im 37/67  brain]
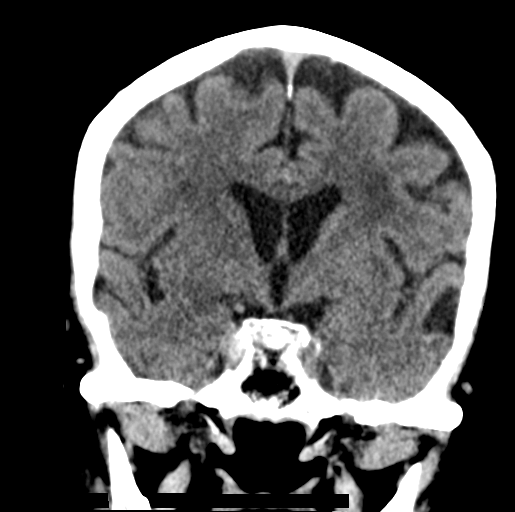

[Series 6: head 3.0 sag st · sagittal · 0.32mm/px · 3 of 52 slices shown]
[im 18/52  brain]
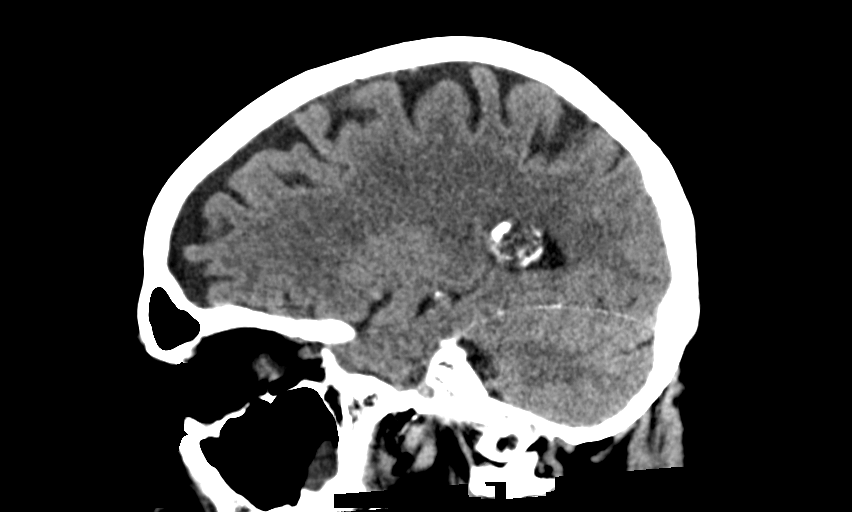
[im 26/52  brain]
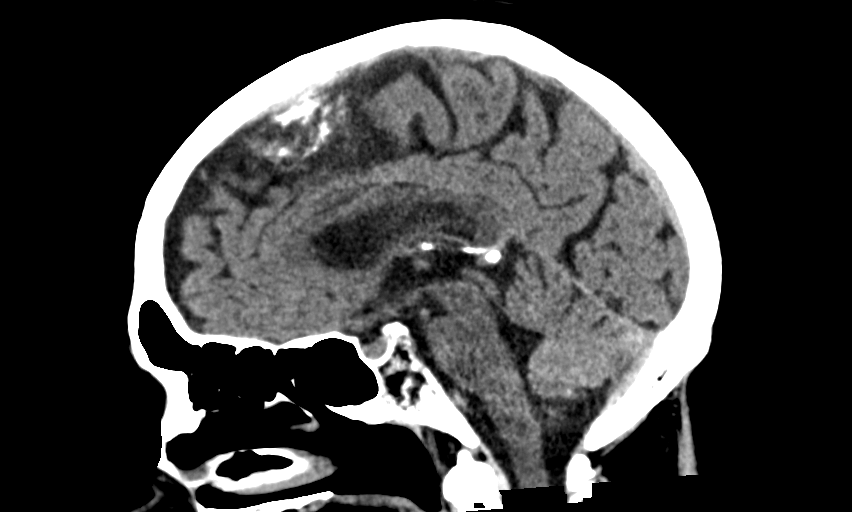
[im 35/52  brain]
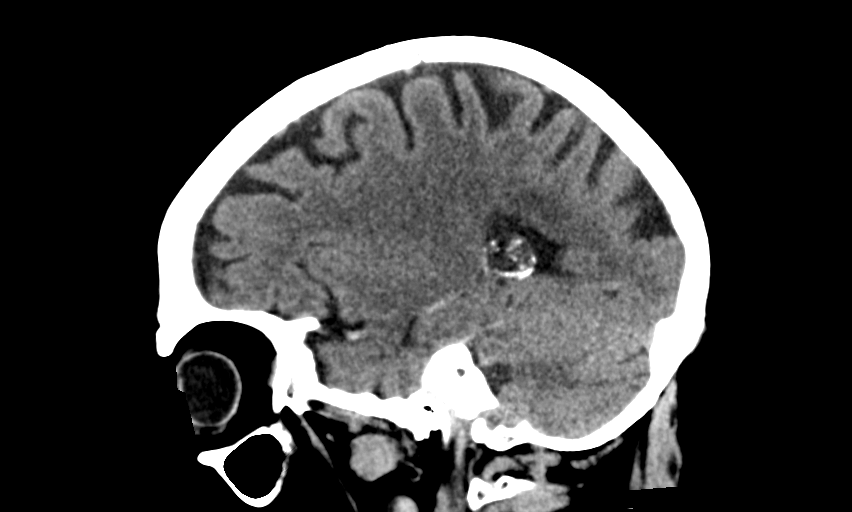

[14 of 47 positions shown; findings below may reference images not displayed]

FINDINGS: Brain: Mild atrophy. Chronic microvascular ischemic changes in the
white matter. Negative for acute cortical infarct. Negative for
hemorrhage or mass.

Vascular: Hyperdense right MCA compatible with thrombus. This
extends into the cavernous carotid.

Skull: Negative

Sinuses/Orbits: Mild mucosal edema left maxillary sinus. Bilateral
cataract removal.

Other: None

ASPECTS (Alberta Stroke Program Early CT Score)

- Ganglionic level infarction (caudate, lentiform nuclei, internal
capsule, insula, M1-M3 cortex): 7

- Supraganglionic infarction (M4-M6 cortex): 3

Total score (0-10 with 10 being normal): 10
IMPRESSION: 1. Hyperdense right MCA compatible with thrombosis. No acute infarct
or hemorrhage
2. ASPECTS is 10

I have paged the stroke neurologist.
